# Patient Record
Sex: Female | Born: 1970
Health system: Southern US, Community
[De-identification: ages and names within clinical notes are randomized; demographics above are authoritative.]

## PROBLEM LIST (undated history)

## (undated) DIAGNOSIS — K5730 Diverticulosis of large intestine without perforation or abscess without bleeding: Secondary | ICD-10-CM

## (undated) DIAGNOSIS — J189 Pneumonia, unspecified organism: Secondary | ICD-10-CM

## (undated) DIAGNOSIS — E785 Hyperlipidemia, unspecified: Secondary | ICD-10-CM

## (undated) DIAGNOSIS — M419 Scoliosis, unspecified: Secondary | ICD-10-CM

## (undated) DIAGNOSIS — K5792 Diverticulitis of intestine, part unspecified, without perforation or abscess without bleeding: Secondary | ICD-10-CM

## (undated) DIAGNOSIS — N809 Endometriosis, unspecified: Secondary | ICD-10-CM

## (undated) DIAGNOSIS — M810 Age-related osteoporosis without current pathological fracture: Secondary | ICD-10-CM

## (undated) DIAGNOSIS — I700 Atherosclerosis of aorta: Secondary | ICD-10-CM

## (undated) DIAGNOSIS — Z720 Tobacco use: Secondary | ICD-10-CM

## (undated) HISTORY — DX: Diverticulosis of large intestine without perforation or abscess without bleeding: K57.30

## (undated) HISTORY — DX: Scoliosis, unspecified: M41.9

## (undated) HISTORY — DX: Atherosclerosis of aorta: I70.0

## (undated) HISTORY — DX: Tobacco use: Z72.0

## (undated) HISTORY — DX: Diverticulitis of intestine, part unspecified, without perforation or abscess without bleeding: K57.92

## (undated) HISTORY — DX: Hyperlipidemia, unspecified: E78.5

---
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
## 2002-09-10 HISTORY — PX: ABDOMINAL HYSTERECTOMY: SHX81

## 2004-09-10 HISTORY — PX: INCISIONAL HERNIA REPAIR: SHX193

## 2006-09-10 HISTORY — PX: INGUINAL HERNIA REPAIR: SUR1180

## 2006-10-04 ENCOUNTER — Ambulatory Visit: Payer: Self-pay | Admitting: General Surgery

## 2006-10-08 ENCOUNTER — Ambulatory Visit: Payer: Self-pay | Admitting: General Surgery

## 2011-12-28 ENCOUNTER — Ambulatory Visit: Payer: Self-pay | Admitting: Family Medicine

## 2012-06-06 ENCOUNTER — Emergency Department: Payer: Self-pay | Admitting: Emergency Medicine

## 2012-06-06 LAB — URINALYSIS, COMPLETE
Bilirubin,UR: NEGATIVE
Glucose,UR: NEGATIVE mg/dL (ref 0–75)
Specific Gravity: 1.027 (ref 1.003–1.030)
WBC UR: 3 /HPF (ref 0–5)

## 2012-06-06 LAB — CBC WITH DIFFERENTIAL/PLATELET
Eosinophil #: 0 10*3/uL (ref 0.0–0.7)
HCT: 42.7 % (ref 35.0–47.0)
HGB: 14.5 g/dL (ref 12.0–16.0)
Lymphocyte %: 5.5 %
MCH: 31.6 pg (ref 26.0–34.0)
MCHC: 33.9 g/dL (ref 32.0–36.0)
MCV: 93 fL (ref 80–100)
Monocyte #: 0.5 x10 3/mm (ref 0.2–0.9)
Neutrophil %: 89.2 %
Platelet: 178 10*3/uL (ref 150–440)

## 2012-06-06 LAB — COMPREHENSIVE METABOLIC PANEL
Alkaline Phosphatase: 90 U/L (ref 50–136)
Anion Gap: 12 (ref 7–16)
Bilirubin,Total: 0.5 mg/dL (ref 0.2–1.0)
Calcium, Total: 9.1 mg/dL (ref 8.5–10.1)
Chloride: 100 mmol/L (ref 98–107)
Co2: 23 mmol/L (ref 21–32)
Creatinine: 0.63 mg/dL (ref 0.60–1.30)
EGFR (African American): >60
EGFR (Non-African Amer.): >60
Osmolality: 269 (ref 275–301)
Potassium: 3.7 mmol/L (ref 3.5–5.1)
Sodium: 135 mmol/L — ABNORMAL LOW (ref 136–145)

## 2012-06-06 LAB — RAPID INFLUENZA A&B ANTIGENS

## 2012-06-06 IMAGING — CR DG CHEST 2V
1 series · 3 of 3 positions shown · non-contrast
Comparison: none

REASON FOR EXAM: cough
COMMENTS:

PROCEDURE:     DXR - DXR CHEST PA (OR AP) AND LATERAL  - [DATE]  [DATE]
RESULT:     Comparison: None

[Series 1: w chest pa · 0.14mm/px · 3 of 3 slices shown]
[im 1/3]
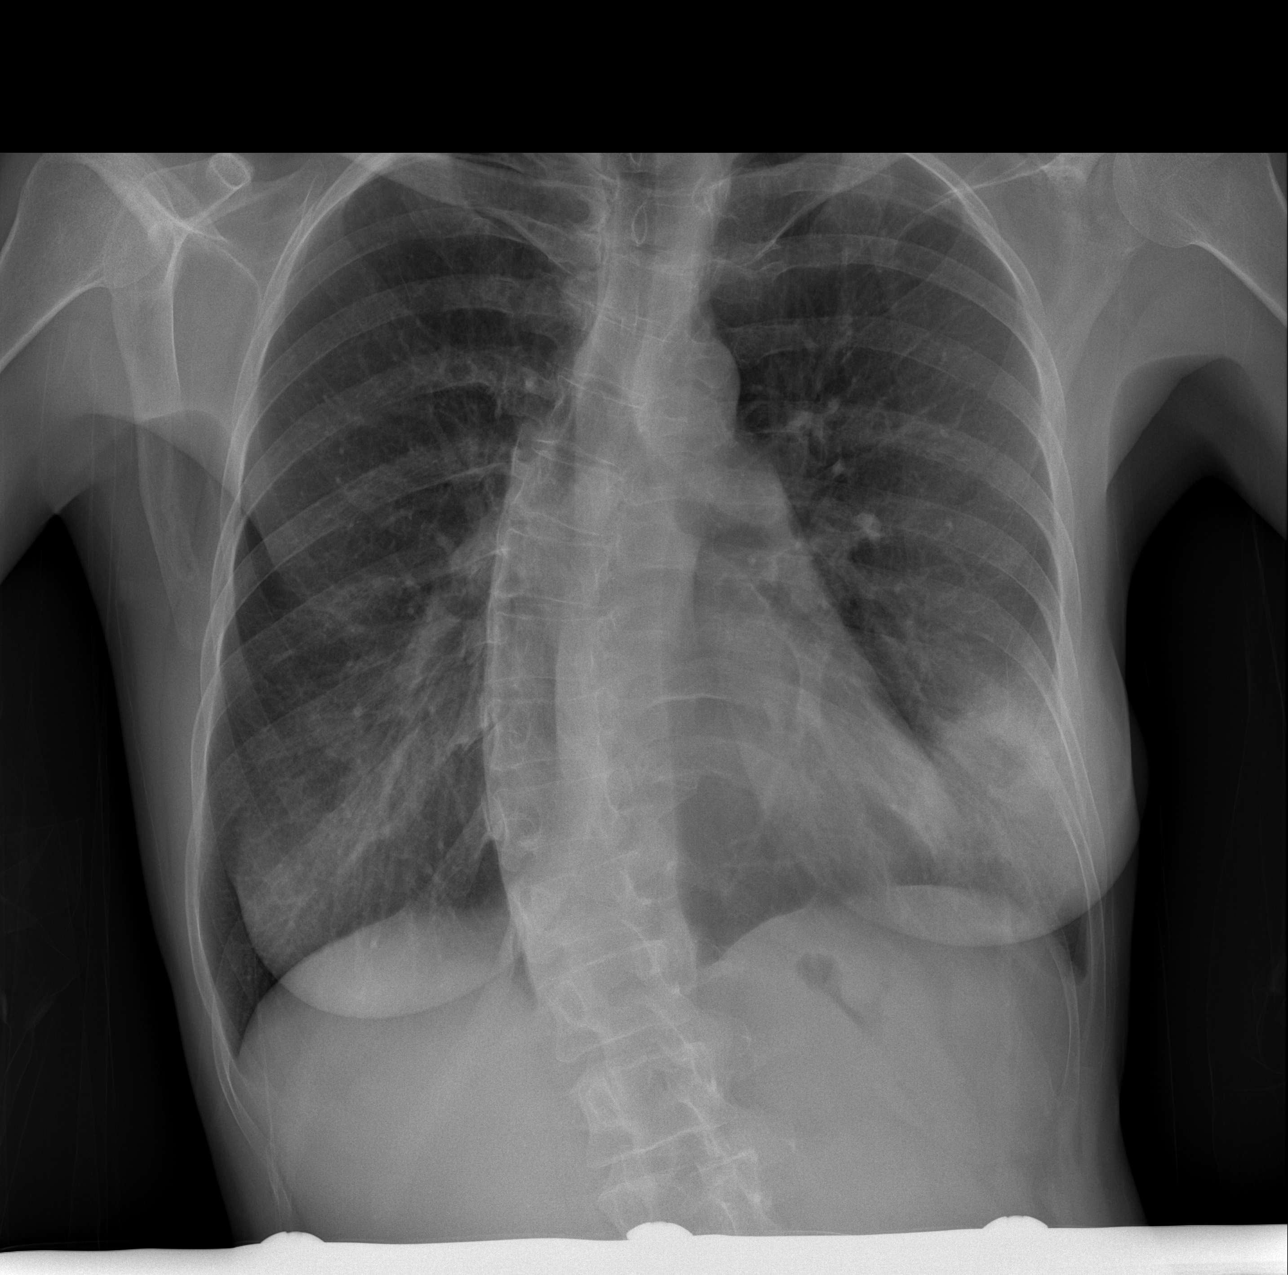
[im 2/3]
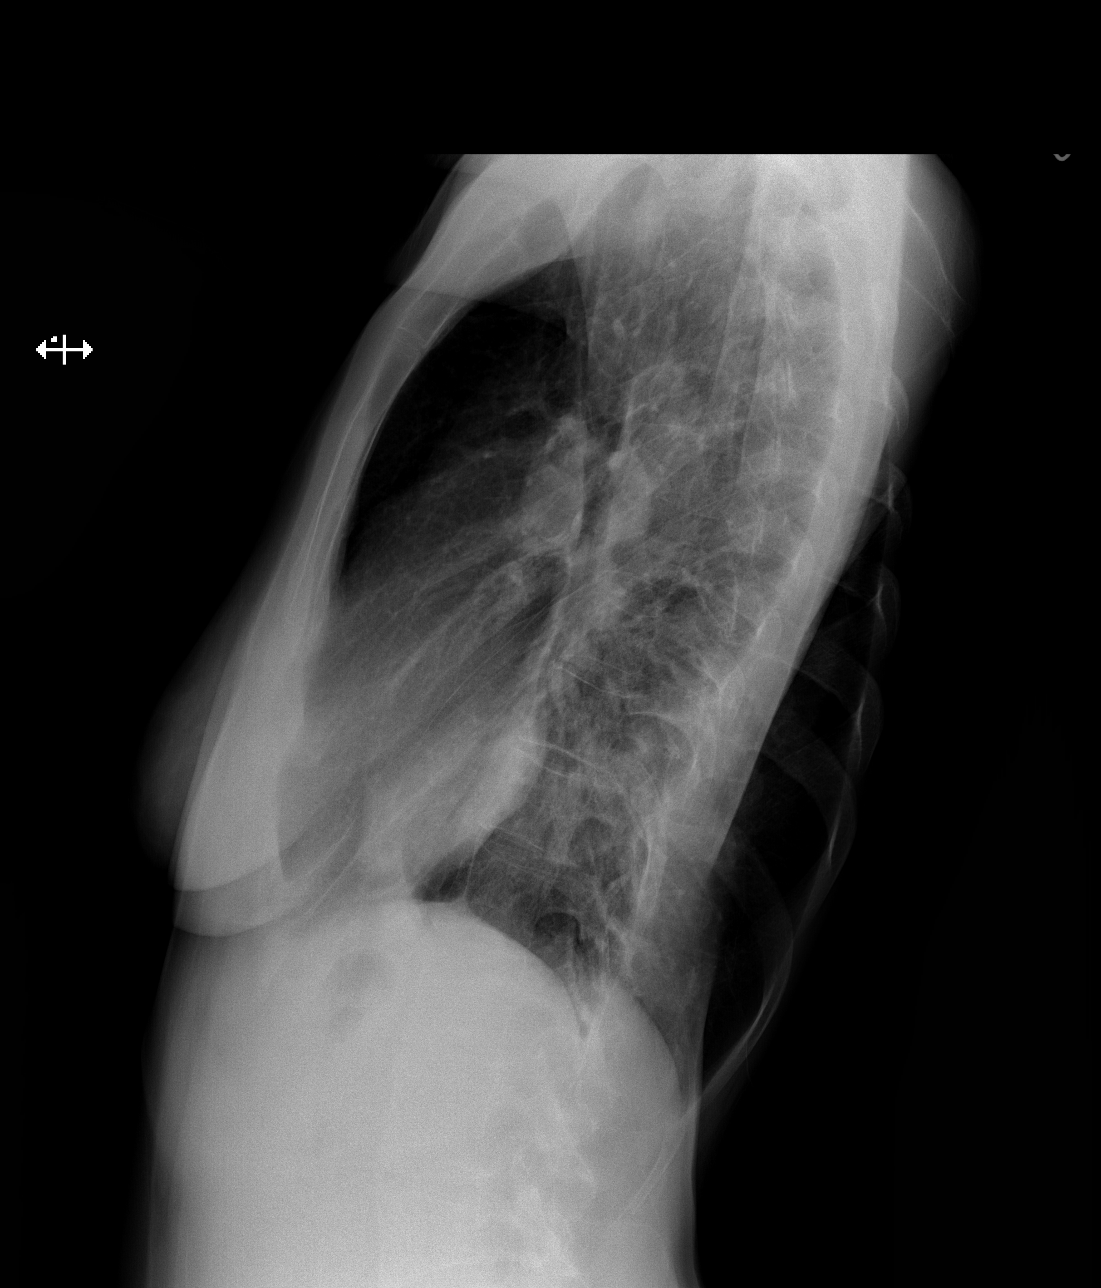
[im 3/3]
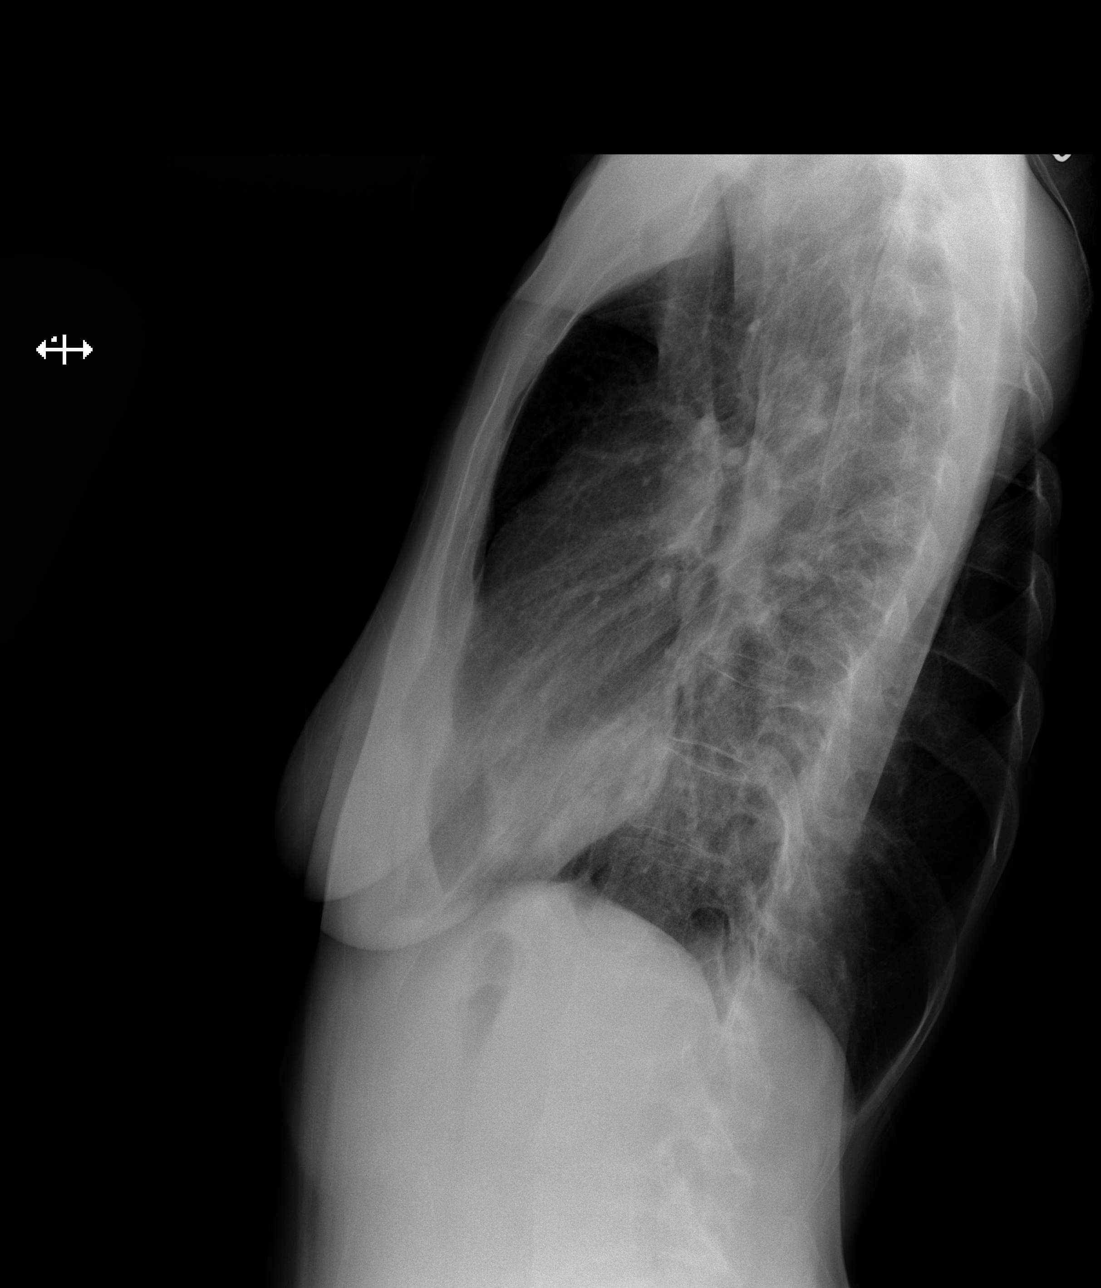

[3 of 3 positions shown; findings below may reference images not displayed]

FINDINGS: PA and lateral chest radiographs are provided. There is lingular airspace
disease concerning for pneumonia. There is no pleural effusion or
pneumothorax. The heart and mediastinum are unremarkable.  The osseous
structures are unremarkable.
IMPRESSION: Lingular pneumonia. Recommend followup radiography to document complete
resolution following adequate medical therapy. If there is not complete
resolution, then recommend further evaluation with CT of the chest to
exclude underlying pathology.

[REDACTED]

## 2012-06-06 IMAGING — CT CT ABD-PELV W/O CM
1 of 2 series · 15 of 32 positions shown, 19 images · non-contrast
Comparison: none

REASON FOR EXAM: (1) left flank pain; (2) left flank pain
COMMENTS:

PROCEDURE:     CT  - CT ABDOMEN AND PELVIS W[DATE]  [DATE]
RESULT:     CT abdomen pelvis dated [DATE].
TECHNIQUE: Helical noncontrasted 3 mm sections were obtained from the lung
bases through the pubic symphysis.

[Series 2: 3mm soft tissue · axial · 0.67mm/px · z∈[-478,-112]mm · 15 of 134 slices shown, 19 images]
[im 6/134  soft-tissue]
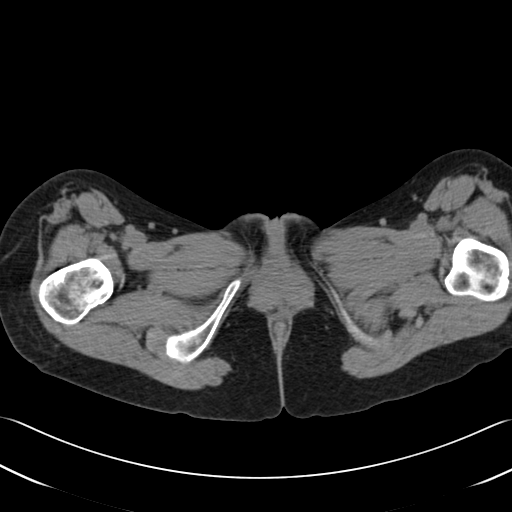
[im 6/134  bone]
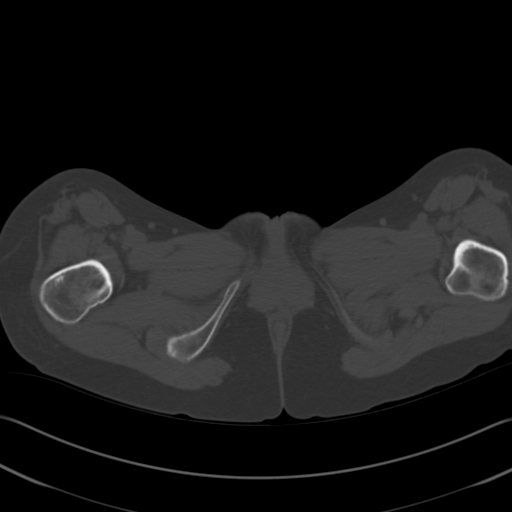
[im 17/134  soft-tissue]
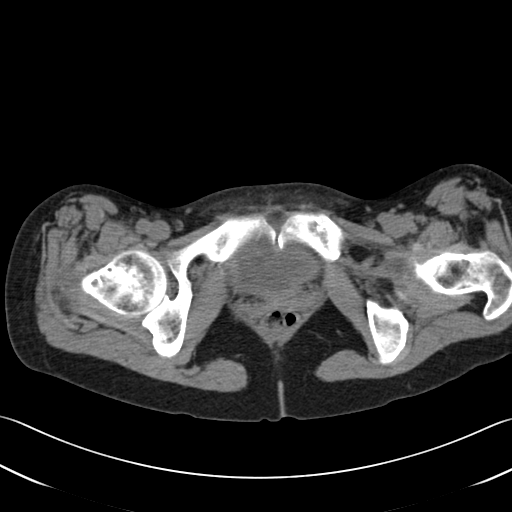
[im 28/134  soft-tissue]
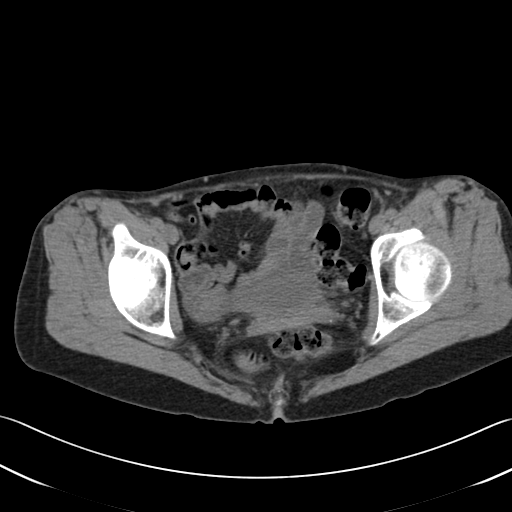
[im 39/134  soft-tissue]
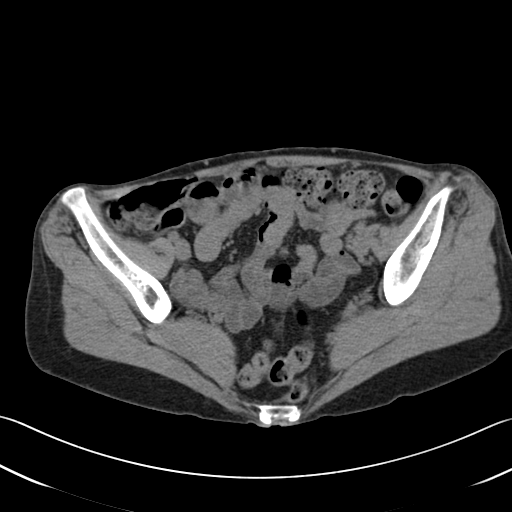
[im 45/134  soft-tissue]
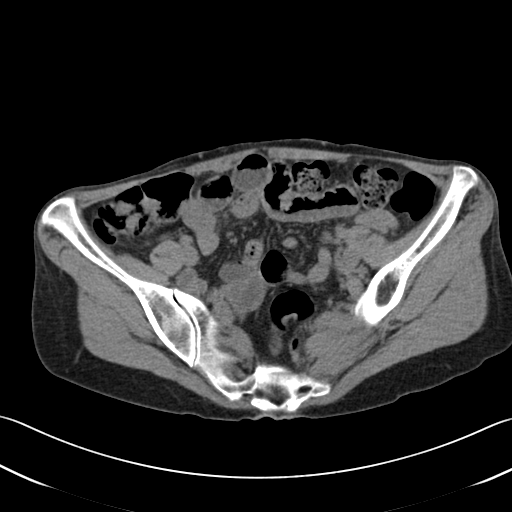
[im 56/134  soft-tissue]
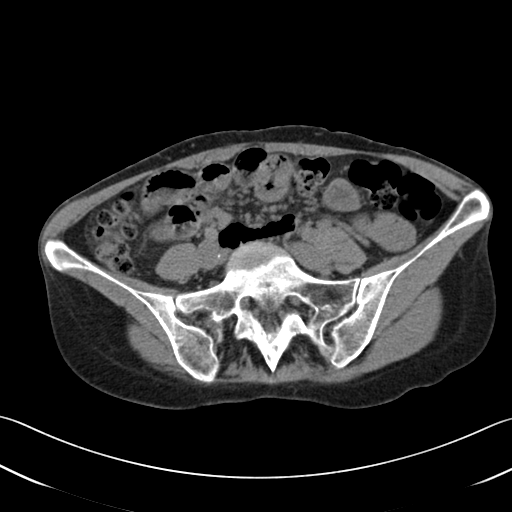
[im 67/134  soft-tissue]
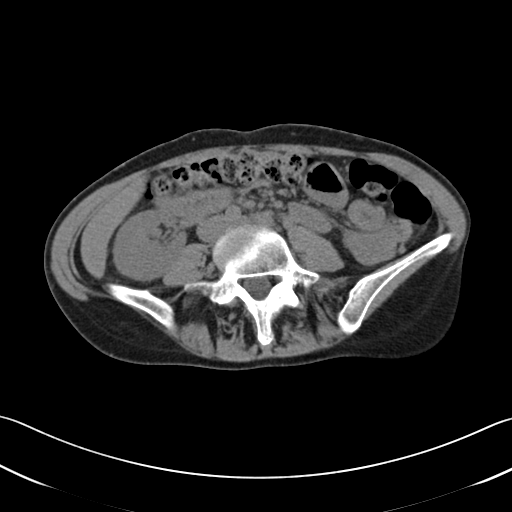
[im 78/134  soft-tissue]
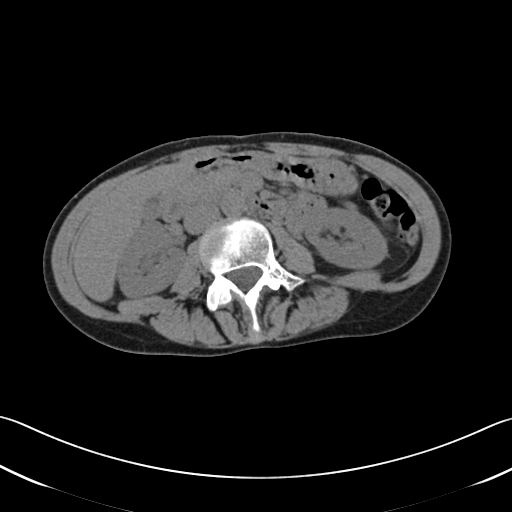
[im 89/134  soft-tissue]
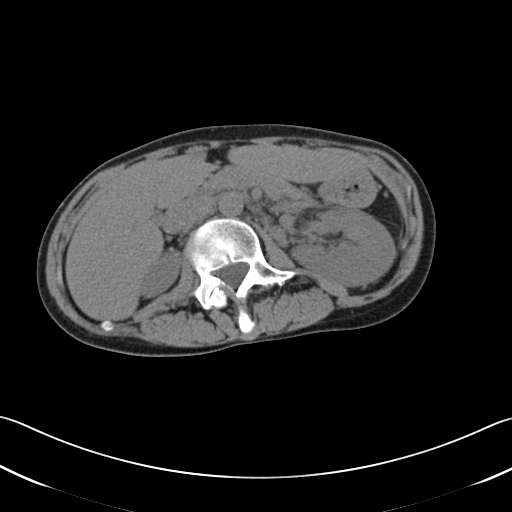
[im 89/134  bone]
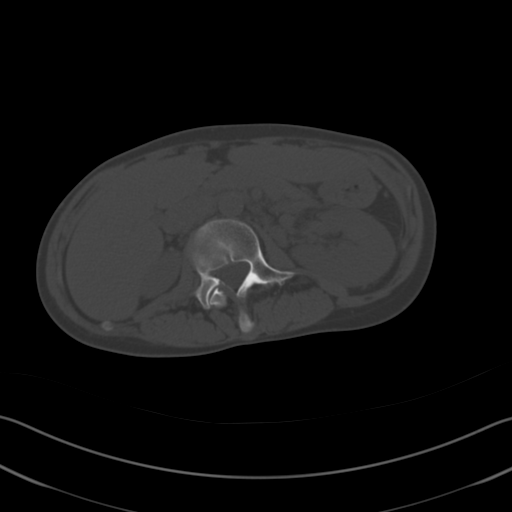
[im 95/134  soft-tissue]
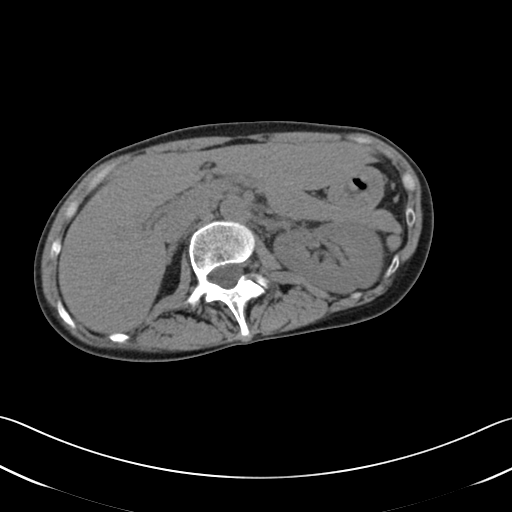
[im 106/134  soft-tissue]
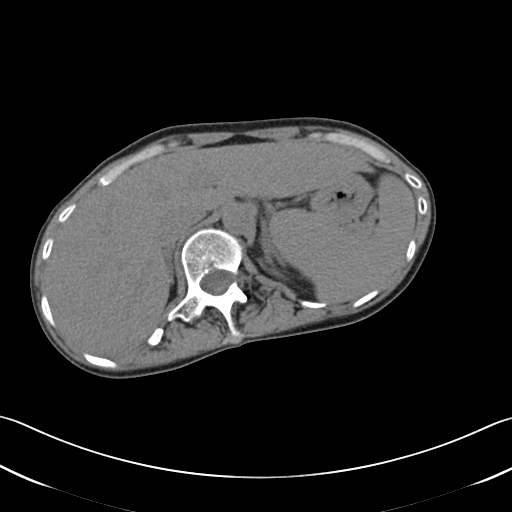
[im 111/134  lung]
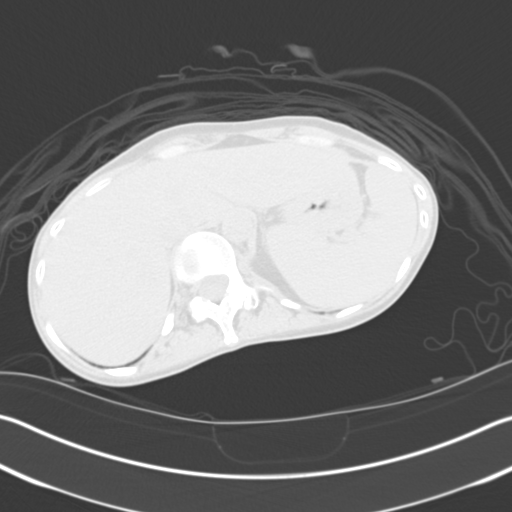
[im 117/134  soft-tissue]
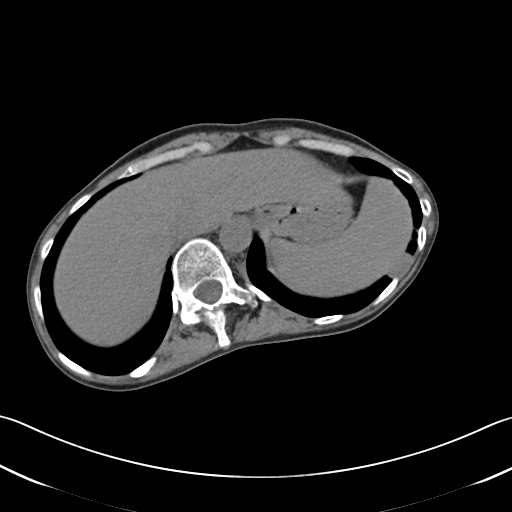
[im 117/134  lung]
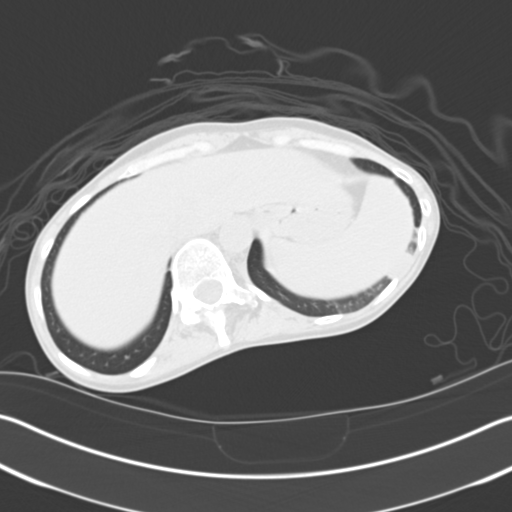
[im 122/134  lung]
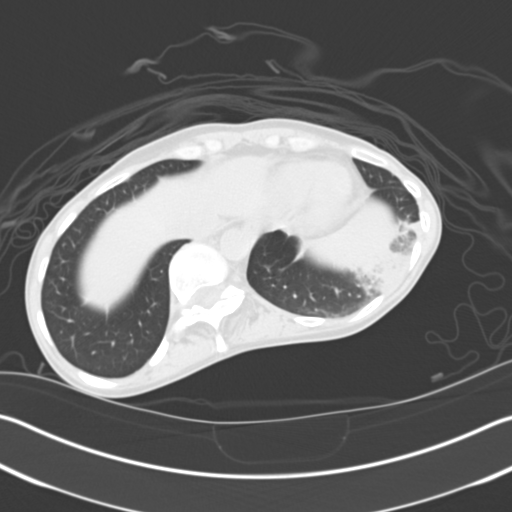
[im 128/134  soft-tissue]
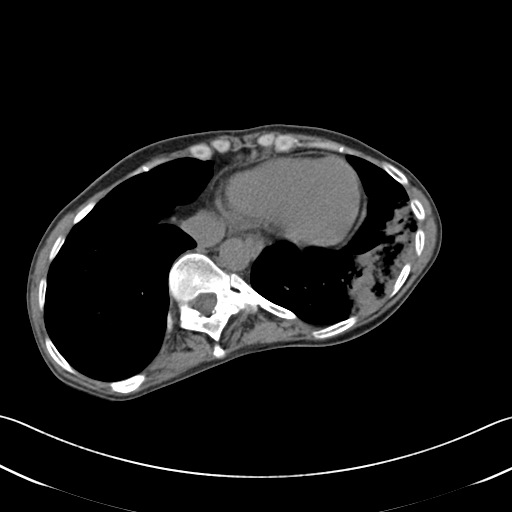
[im 128/134  lung]
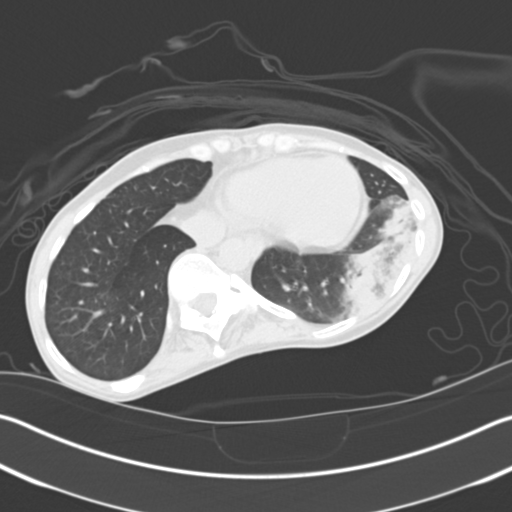

[15 of 32 positions shown; findings below may reference images not displayed]

FINDINGS: An area of increased density with consolidative component projects
within the lateral aspect of the base of the lingula.

Noncontrast evaluation of the liver, spleen, kidneys, pancreas is
unremarkable. There is no evidence of hydronephrosis, hydroureter,
nephrolithiasis nor ureterolithiasis. There is no CT evidence of bowel
obstruction, enteritis, colitis, diverticulitis, nor appendicitis within the
limitations of a noncontrast CT. There is no evidence of abdominal aortic
aneurysm. There is no evidence of abdominal pelvic free fluid, loculated
fluid collections nor gross evidence of masses or adenopathy.
IMPRESSION: Findings consistent with a lingula infiltrate. Note
surveillance evaluation with plain film chest radiograph status post
appropriate therapeutic regiment is recommended to ensure resolution. If
this area persists status post appropriate therapeutic regiment evaluation
with chest CT is recommended and possibly pulmonary consultation.

## 2012-06-07 LAB — URINE CULTURE

## 2012-06-13 ENCOUNTER — Ambulatory Visit: Payer: Self-pay | Admitting: Family Medicine

## 2012-06-13 IMAGING — CR DG CHEST 2V
1 series · 2 of 2 positions shown · non-contrast
Comparison: none

REASON FOR EXAM: pneumonia
COMMENTS:

[Series 1: pa · 0.17mm/px · 2 of 2 slices shown]
[im 1/2]
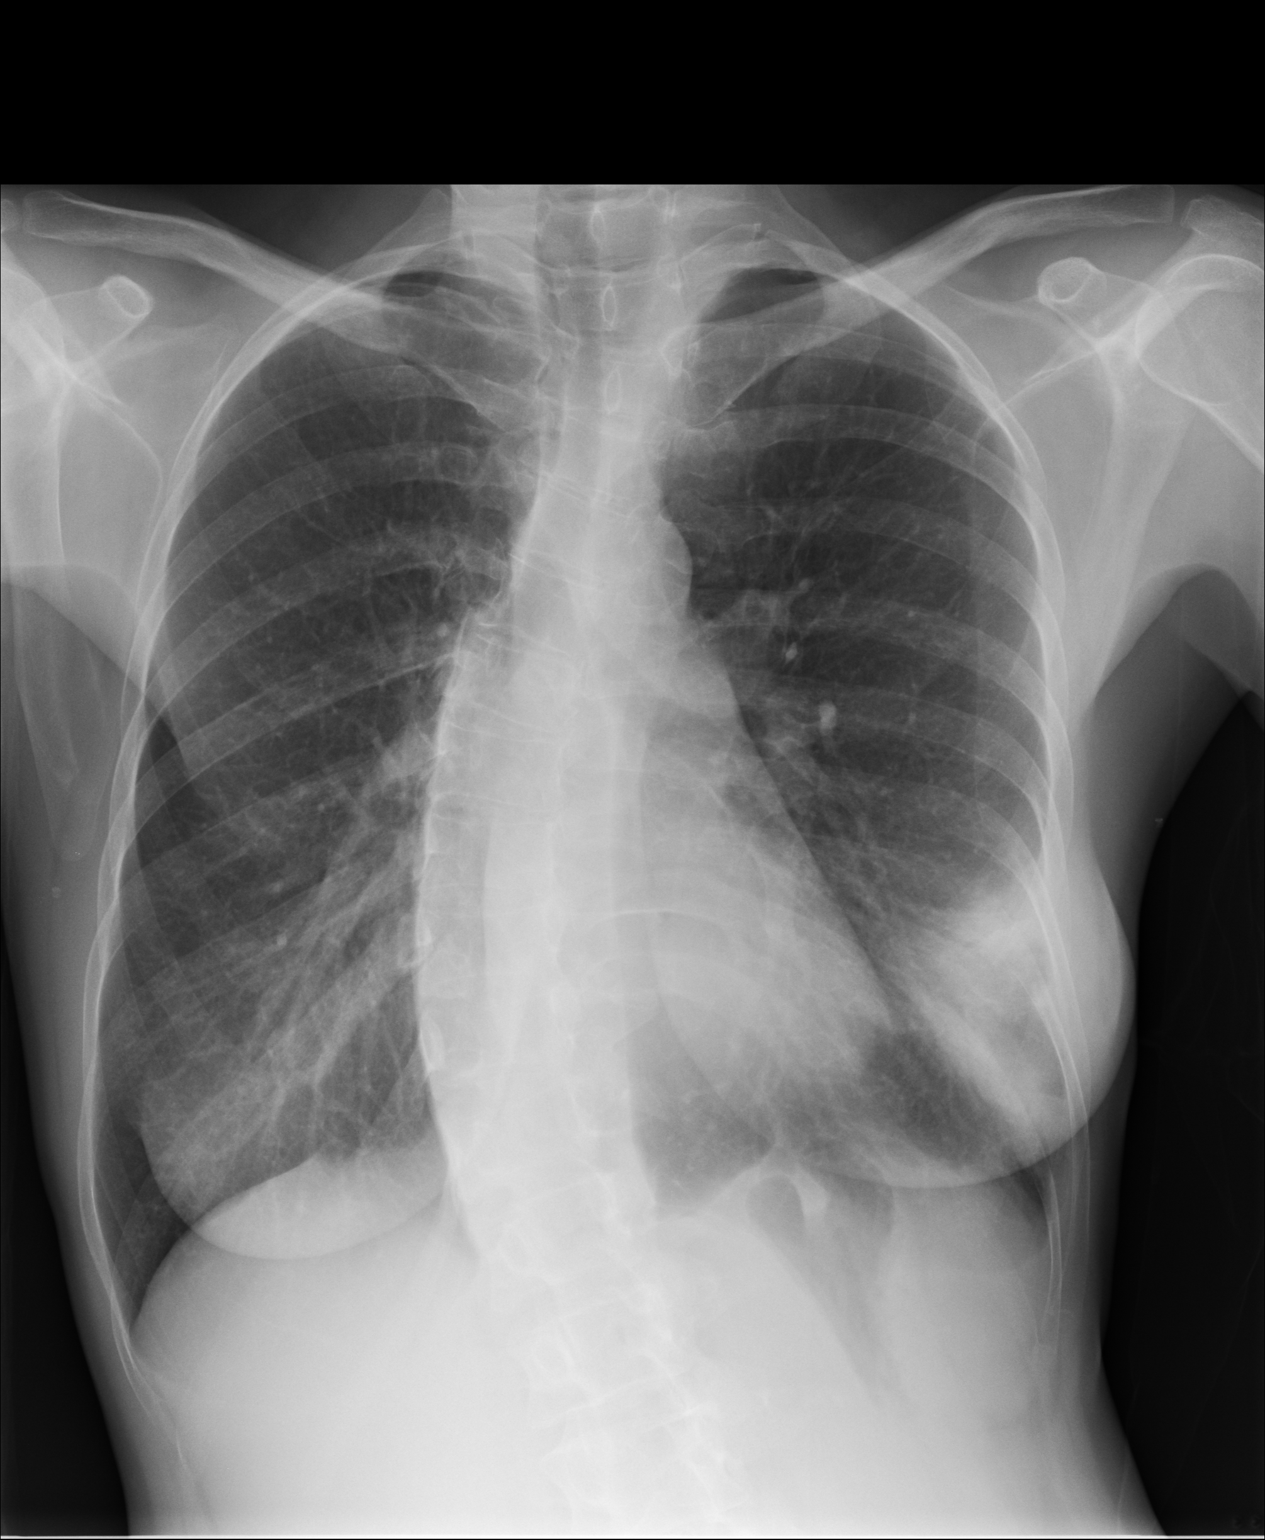
[im 2/2]
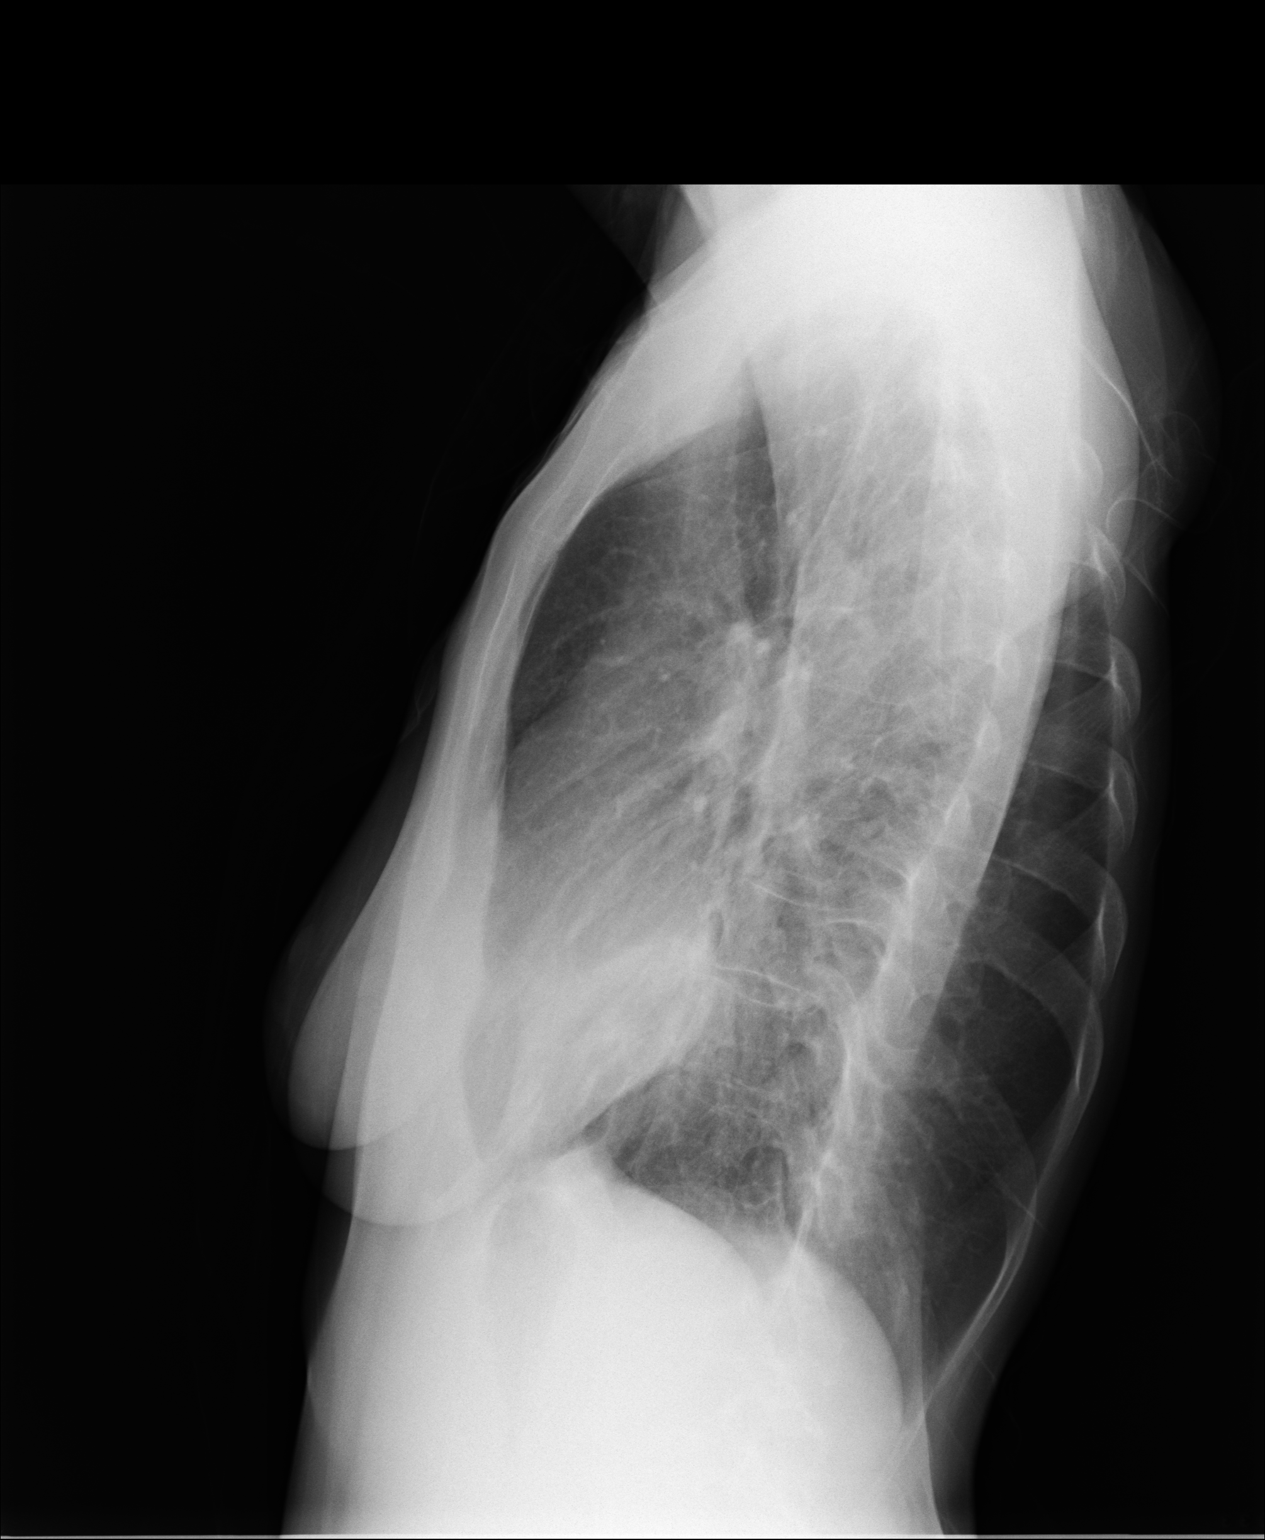

[2 of 2 positions shown; findings below may reference images not displayed]

PROCEDURE:     SANHER - SANHER CHEST PA (OR AP) AND LAT  - [DATE] [DATE]

RESULT:     Comparison is made to the study [DATE].

There remains density in the lateral aspect of the lingula. It has become
more dense. Elsewhere the lungs remain hyperinflated. No other infiltrate is
seen and there is no pleural effusion. The cardiac silhouette is normal in
size.
IMPRESSION: There is persistent increased density peripherally in the
left lower lung likely in the lingula laterally. Further evaluation with CT
scanning is recommended given the increase rather than the expected decrease
in density.

[REDACTED]

## 2012-06-26 ENCOUNTER — Ambulatory Visit: Payer: Self-pay | Admitting: Internal Medicine

## 2012-06-26 IMAGING — CT CT CHEST W/ CM
1 series · 16 of 34 positions shown, 20 images · IV contrast (isovue)
Comparison: none

REASON FOR EXAM: abnormal XR recommended chest CT
COMMENTS:

PROCEDURE:     KCT - KCT CHEST WITH CONTRAST  - [DATE]  [DATE]
RESULT:     Comparison: Chest radiograph of [DATE] and [DATE]
TECHNIQUE: Multiple axial images of the chest were obtained with 75 mL
Isovue 370 intravenous contrast.

[Series 2: chest w/ 3.0 i31f 2 · axial · 0.68mm/px · z∈[-815,-536]mm · 16 of 105 slices shown, 20 images]
[im 8/105  mediastinal]
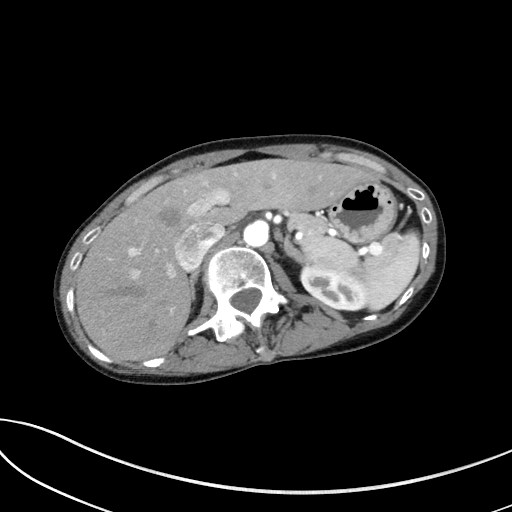
[im 8/105  lung]
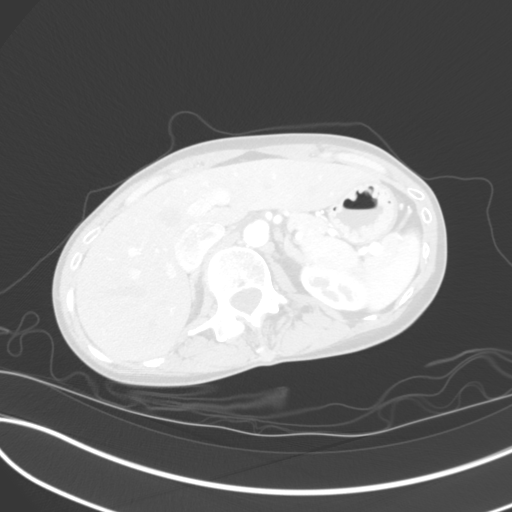
[im 16/105  lung]
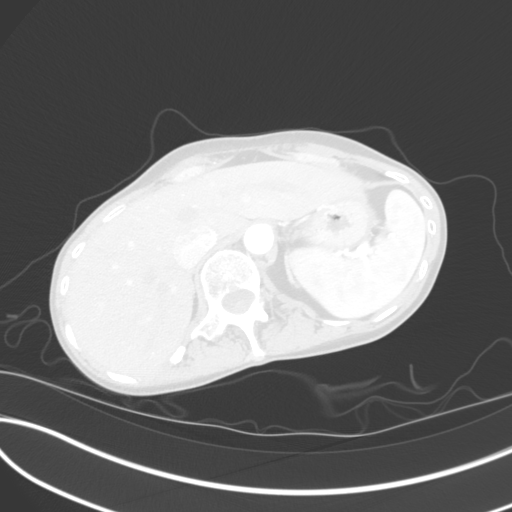
[im 21/105  lung]
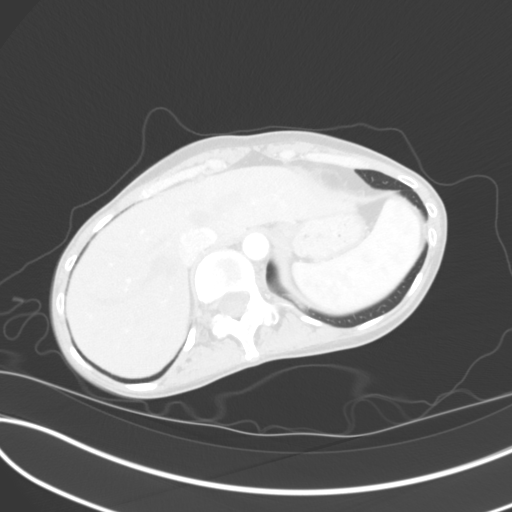
[im 27/105  lung]
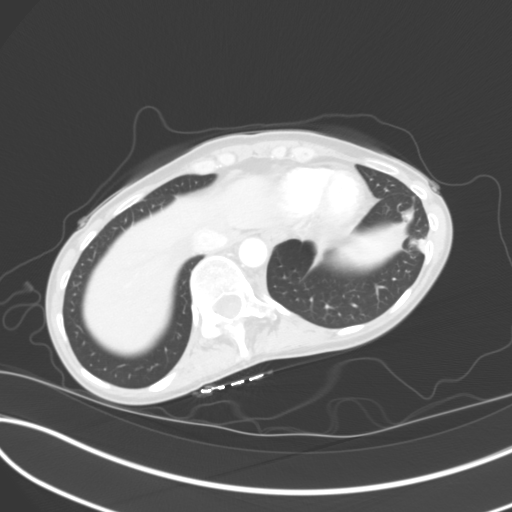
[im 35/105  mediastinal]
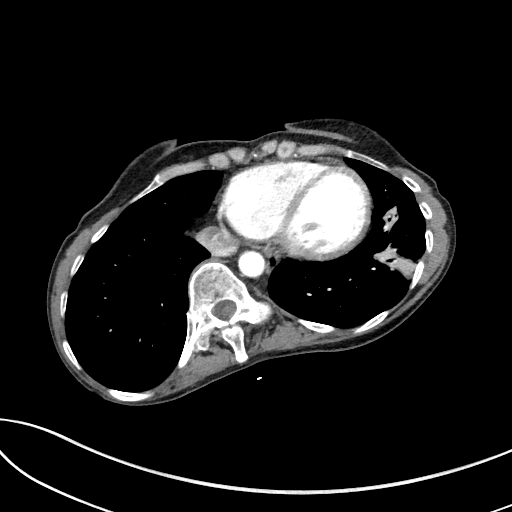
[im 35/105  lung]
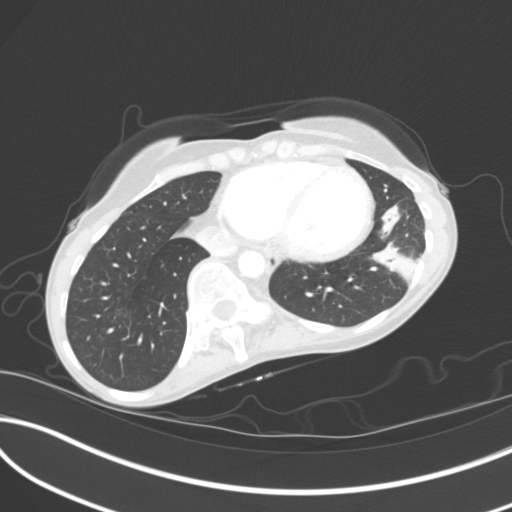
[im 42/105  lung]
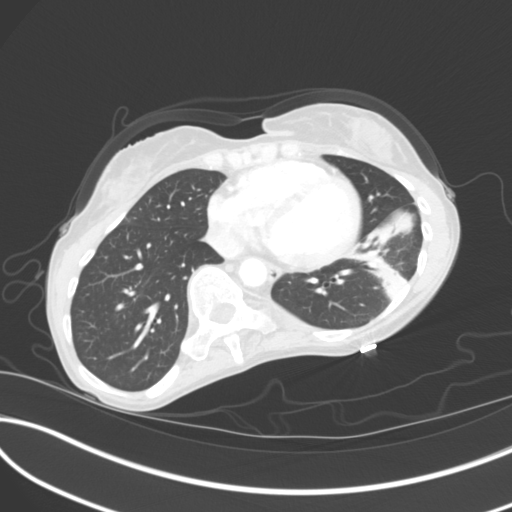
[im 47/105  lung]
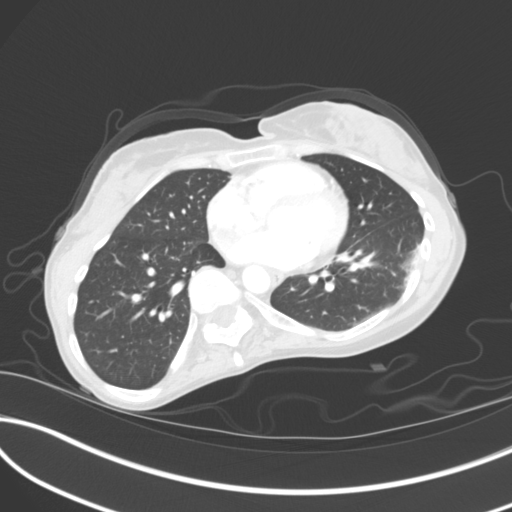
[im 51/105  lung]
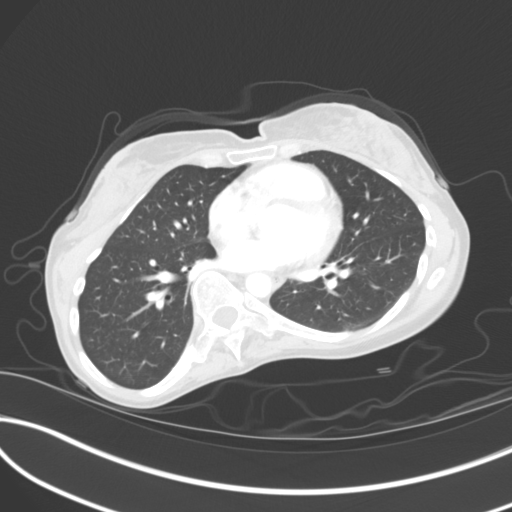
[im 56/105  mediastinal]
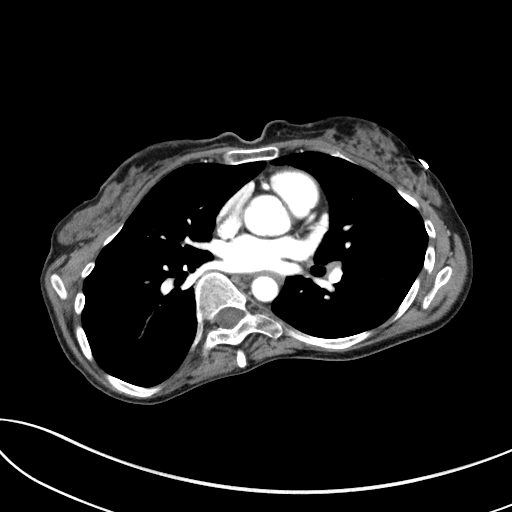
[im 56/105  lung]
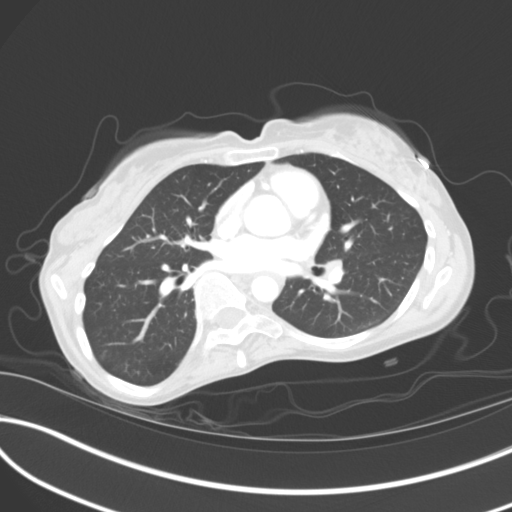
[im 62/105  lung]
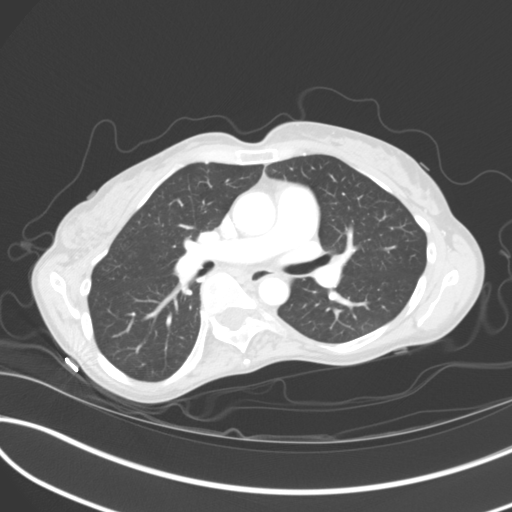
[im 66/105  lung]
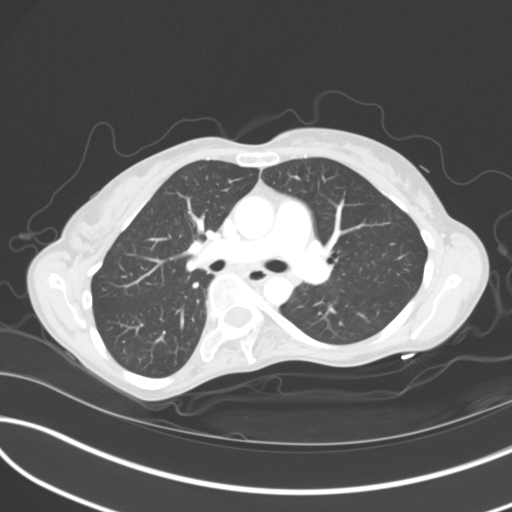
[im 74/105  lung]
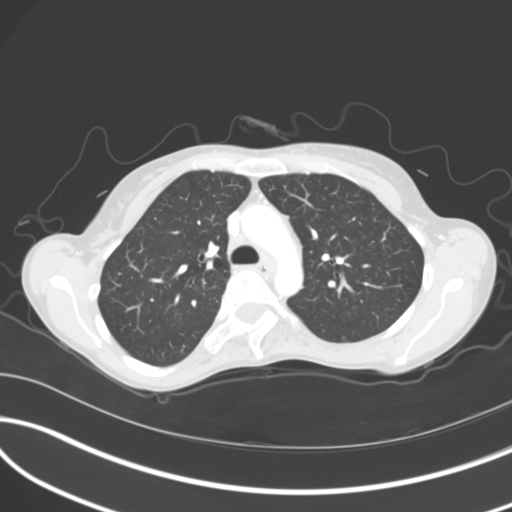
[im 81/105  mediastinal]
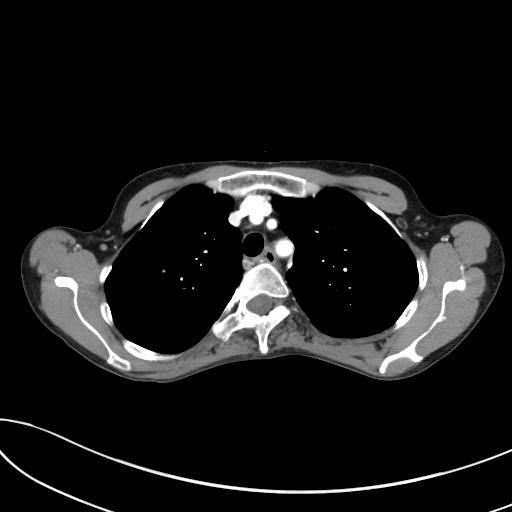
[im 81/105  lung]
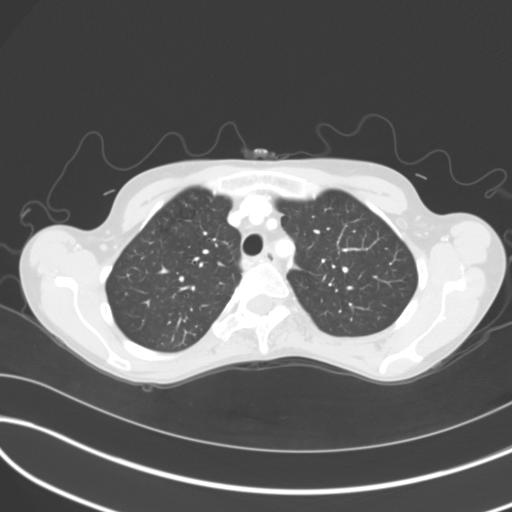
[im 85/105  lung]
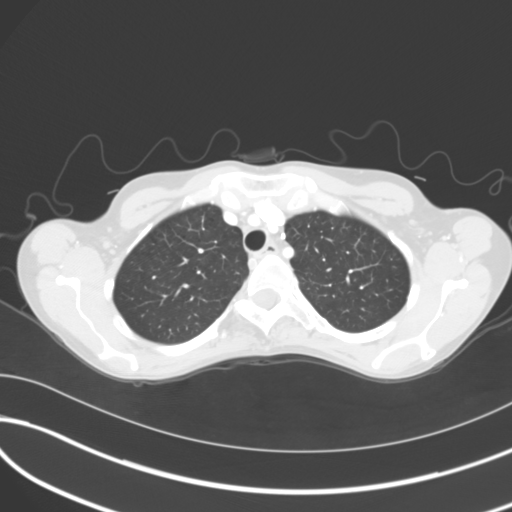
[im 93/105  lung]
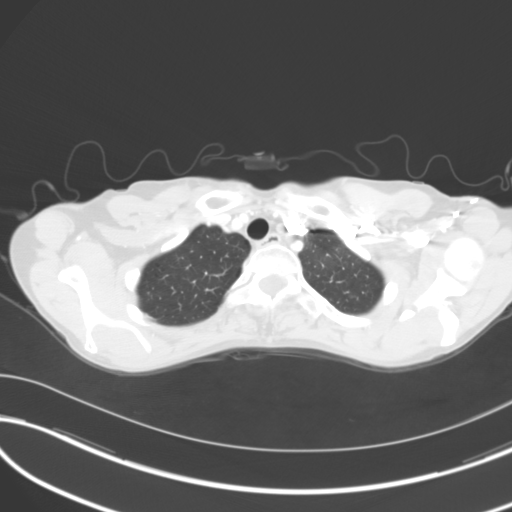
[im 101/105  lung]
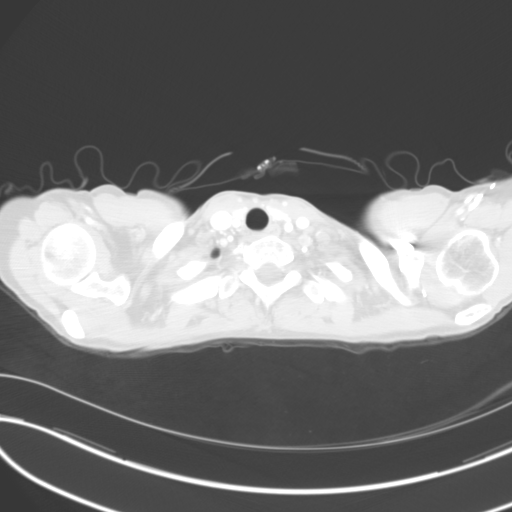

[16 of 34 positions shown; findings below may reference images not displayed]

FINDINGS: No mediastinal, hilar, or axillary lymphadenopathy.

There is mild scarring at the lung apices. There are 2 heterogeneous
bandlike densities in the left lower lobe which extend to the periphery of
the lung. These correlate with the findings on the chest radiograph. There
are some air bronchograms present.

No aggressive lytic or sclerotic osseous lesions are identified. There is a
dextroscoliosis of the thoracic spine.
IMPRESSION: There are two heterogeneous bandlike opacities in the left lower lobe. These
are nonspecific and may be infectious or inflammatory. Followup radiography
is recommended to ensure resolution and exclude other etiology.

## 2012-07-29 ENCOUNTER — Ambulatory Visit: Payer: Self-pay | Admitting: Family Medicine

## 2012-07-29 IMAGING — CR DG CHEST 2V
1 series · 2 of 2 positions shown · non-contrast
Comparison: none

REASON FOR EXAM: back pain congestion cough
COMMENTS:

PROCEDURE:     SAS - SAS CHEST PA (OR AP) AND LAT  - [DATE]  [DATE]
RESULT:     Comparison: Chest radiograph [DATE] and CT of the chest
[DATE]

[Series 1: pa · 0.17mm/px · 2 of 2 slices shown]
[im 1/2]
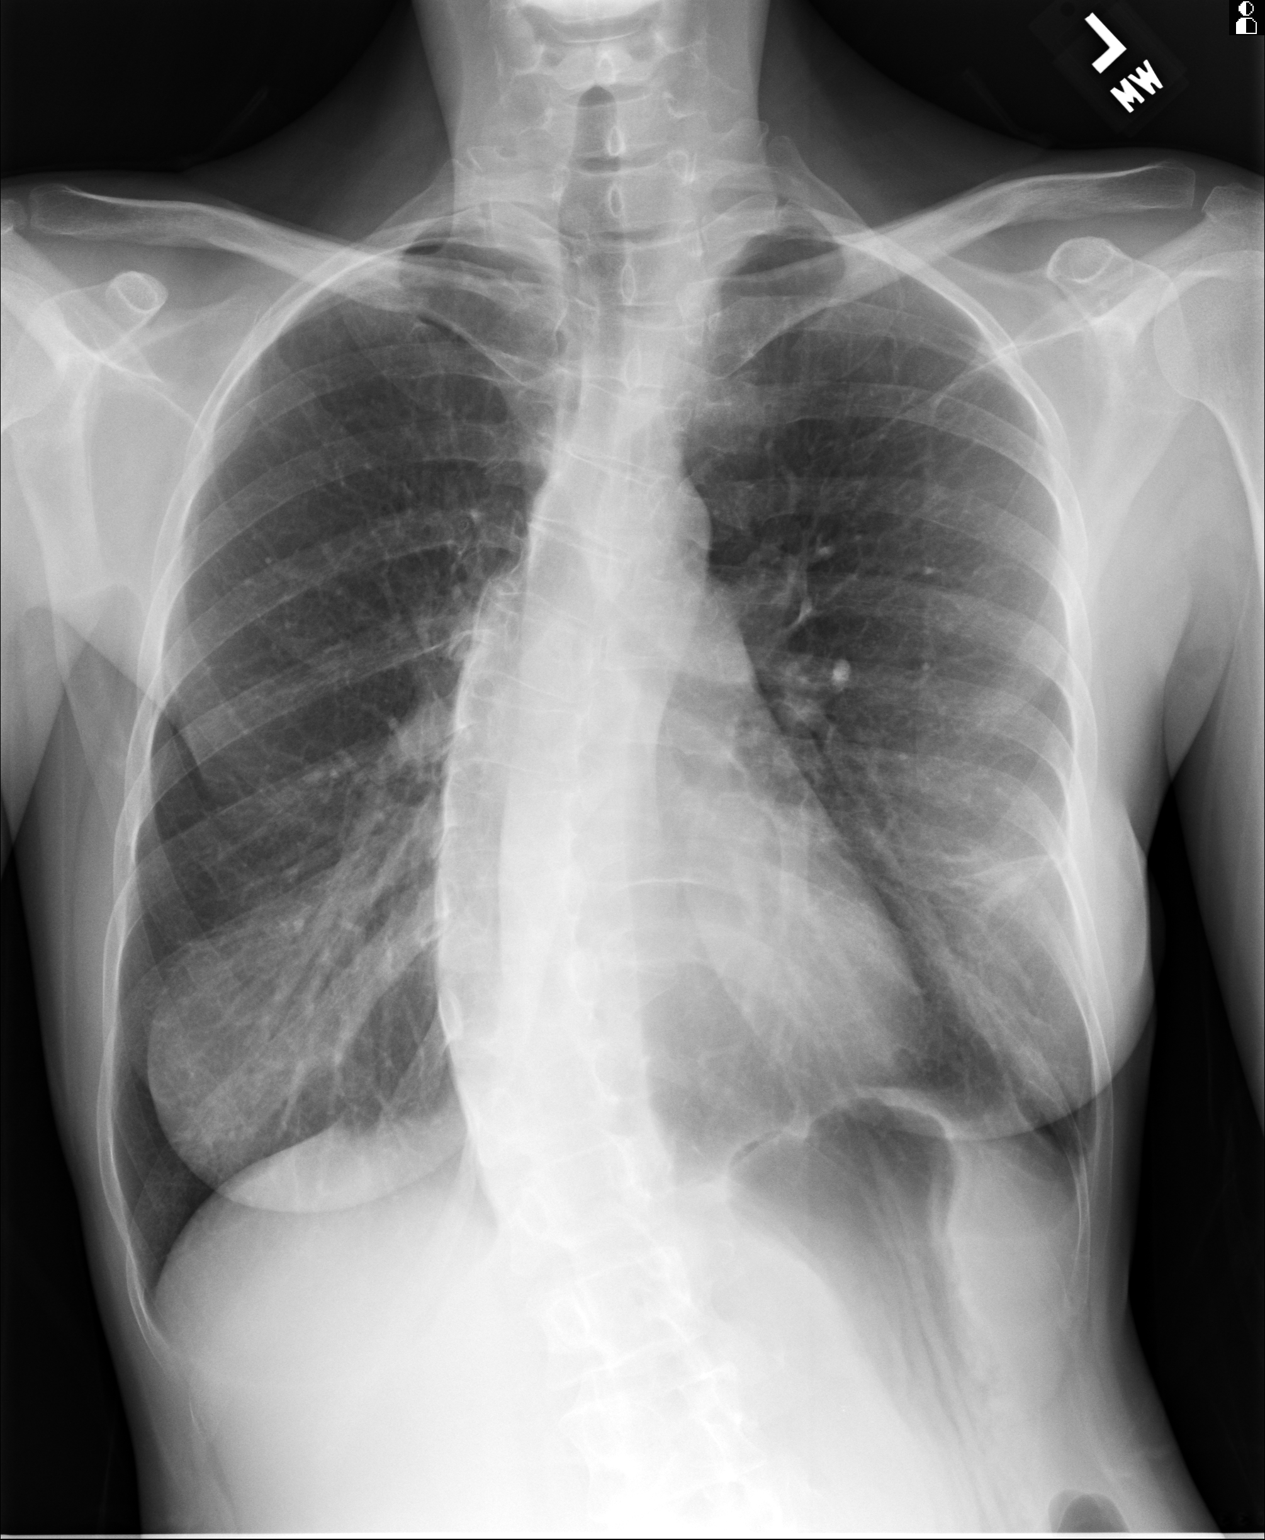
[im 2/2]
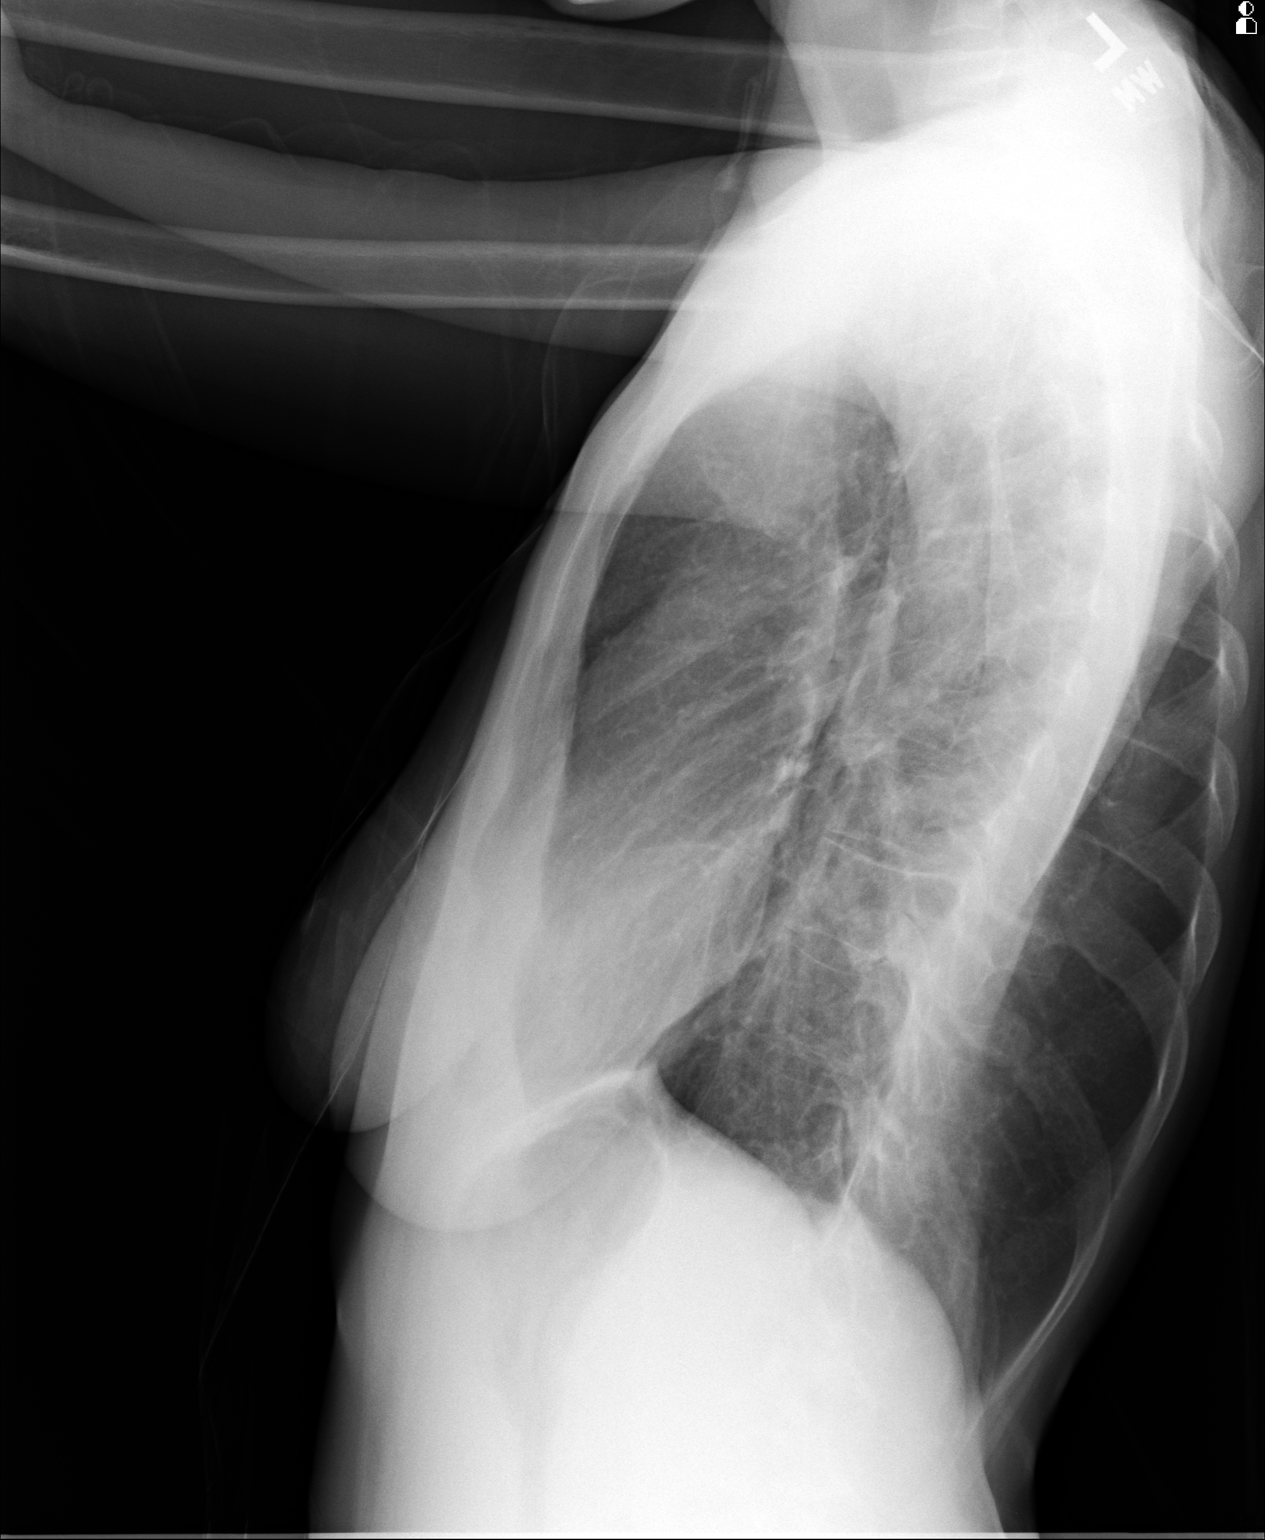

[2 of 2 positions shown; findings below may reference images not displayed]

FINDINGS: The heart and mediastinum are stable. There is dextroscoliosis of the
thoracic spine. There has been near complete resolution of the heterogeneous
opacity in the periphery of the left lower lung. The right lung is clear.
IMPRESSION: Near complete resolution of heterogeneous opacity in the periphery of the
left lower lung. Continued radiographic followup is recommended to ensure
complete resolution.

[REDACTED]

## 2012-09-04 ENCOUNTER — Inpatient Hospital Stay (HOSPITAL_COMMUNITY)
Admission: AD | Admit: 2012-09-04 | Discharge: 2012-09-04 | Disposition: A | Discharge status: Home / Self Care | Payer: BC Managed Care – PPO | Source: Ambulatory Visit | Attending: Obstetrics & Gynecology | Admitting: Obstetrics & Gynecology

## 2012-09-04 ENCOUNTER — Encounter (HOSPITAL_COMMUNITY): Payer: Self-pay | Admitting: *Deleted

## 2012-09-04 DIAGNOSIS — Z90710 Acquired absence of both cervix and uterus: Secondary | ICD-10-CM | POA: Insufficient documentation

## 2012-09-04 DIAGNOSIS — R109 Unspecified abdominal pain: Secondary | ICD-10-CM

## 2012-09-04 DIAGNOSIS — N952 Postmenopausal atrophic vaginitis: Secondary | ICD-10-CM

## 2012-09-04 DIAGNOSIS — N898 Other specified noninflammatory disorders of vagina: Secondary | ICD-10-CM

## 2012-09-04 HISTORY — DX: Pneumonia, unspecified organism: J18.9

## 2012-09-04 HISTORY — DX: Endometriosis, unspecified: N80.9

## 2012-09-04 LAB — URINALYSIS, ROUTINE W REFLEX MICROSCOPIC
Bilirubin Urine: NEGATIVE
Ketones, ur: NEGATIVE mg/dL
Nitrite: NEGATIVE
Specific Gravity, Urine: <1.005 — ABNORMAL LOW (ref 1.005–1.030)
Urobilinogen, UA: 0.2 mg/dL (ref 0.0–1.0)

## 2012-09-04 LAB — CBC
HCT: 43.4 % (ref 36.0–46.0)
Hemoglobin: 14.4 g/dL (ref 12.0–15.0)
MCHC: 33.2 g/dL (ref 30.0–36.0)
MCV: 92.3 fL (ref 78.0–100.0)

## 2012-09-04 LAB — WET PREP, GENITAL
Clue Cells Wet Prep HPF POC: NONE SEEN
Trich, Wet Prep: NONE SEEN

## 2012-09-04 LAB — URINE MICROSCOPIC-ADD ON

## 2012-09-04 MED ORDER — KETOROLAC TROMETHAMINE 60 MG/2ML IM SOLN
60.0000 mg | Freq: Once | INTRAMUSCULAR | Status: AC
  Administered 2012-09-04: 60 mg via INTRAMUSCULAR
  Filled 2012-09-04: qty 2
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
## 2012-09-04 NOTE — MAU Provider Note (Signed)
History     CSN: [id number]  Arrival date and time: 09/04/12 1140   First Provider Initiated Contact with Patient 09/04/12 1238      Chief Complaint  Patient presents with  . Vaginal Bleeding  . Abdominal Cramping   HPI  Kathryn Fuller is a 41 y.o. who presents today with vaginal bleeding and cramping. She states that 11 years ago she had a hysterectomy and they "took everything". This morning she had pink spotting and pain, and states that she continues to feel "gushing" of something coming from the vagina.   Past Medical History  Diagnosis Date  . Pneumonia   . Endometriosis     Past Surgical History  Procedure Date  . Abdominal hysterectomy   . Inguinal hernia repair     History reviewed. No pertinent family history.  History  Substance Use Topics  . Smoking status: Current Every Day Smoker -- 1.5 packs/day    Types: Cigarettes  . Smokeless tobacco: Not on file  . Alcohol Use: No    Allergies: No Known Allergies  Prescriptions prior to admission  Medication Sig Dispense Refill  . ibuprofen (ADVIL,MOTRIN) 200 MG tablet Take 800 mg by mouth every 8 (eight) hours as needed. For pain        Review of Systems  Constitutional: Negative for fever and chills.  Gastrointestinal: Positive for abdominal pain (cramps). Negative for nausea, vomiting, diarrhea and constipation.  Genitourinary: Negative for dysuria, urgency and frequency.  Neurological: Negative for dizziness.   Physical Exam   Blood pressure 96/71, pulse 91, temperature 97.5 F (36.4 C), temperature source Oral, height 5\' 6"  (1.676 m), weight 47.9 kg (105 lb 9.6 oz), SpO2 98.00%.  Physical Exam  Nursing note and vitals reviewed. Constitutional: She is oriented to person, place, and time. She appears well-developed and well-nourished. No distress.  Cardiovascular: Normal rate.   Respiratory: Effort normal.  GI: Soft.  Genitourinary:        External: normal Vagina: severly atrophic with  small amount of watery discharge. Tissue friable and bleeds easily from q-tip Cervix: SA Uterus: SA Adnexa: unable to palpate, presumed SA  Neurological: She is alert and oriented to person, place, and time.  Skin: Skin is warm and dry.  Psychiatric: She has a normal mood and affect.    MAU Course  Procedures  Results for orders placed during the hospital encounter of 09/04/12 (from the past 24 hour(s))  URINALYSIS, ROUTINE W REFLEX MICROSCOPIC     Status: Abnormal   Collection Time   09/04/12 11:55 AM      Component Value Range   Color, Urine YELLOW  YELLOW   APPearance CLEAR  CLEAR   Specific Gravity, Urine <1.005 (*) 1.005 - 1.030   pH 5.5  5.0 - 8.0   Glucose, UA NEGATIVE  NEGATIVE mg/dL   Hgb urine dipstick MODERATE (*) NEGATIVE   Bilirubin Urine NEGATIVE  NEGATIVE   Ketones, ur NEGATIVE  NEGATIVE mg/dL   Protein, ur NEGATIVE  NEGATIVE mg/dL   Urobilinogen, UA 0.2  0.0 - 1.0 mg/dL   Nitrite NEGATIVE  NEGATIVE   Leukocytes, UA TRACE (*) NEGATIVE  URINE MICROSCOPIC-ADD ON     Status: Normal   Collection Time   09/04/12 11:55 AM      Component Value Range   WBC, UA 0-2  <3 WBC/hpf   RBC / HPF 0-2  <3 RBC/hpf  CBC     Status: Normal   Collection Time   09/04/12 12:45 PM  Component Value Range   WBC 6.8  4.0 - 10.5 K/uL   RBC 4.70  3.87 - 5.11 MIL/uL   Hemoglobin 14.4  12.0 - 15.0 g/dL   HCT 40.9  81.1 - 91.4 %   MCV 92.3  78.0 - 100.0 fL   MCH 30.6  26.0 - 34.0 pg   MCHC 33.2  30.0 - 36.0 g/dL   RDW 78.2  95.6 - 21.3 %   Platelets 217  150 - 400 K/uL  WET PREP, GENITAL     Status: Abnormal   Collection Time   09/04/12 12:50 PM      Component Value Range   Yeast Wet Prep HPF POC NONE SEEN  NONE SEEN   Trich, Wet Prep NONE SEEN  NONE SEEN   Clue Cells Wet Prep HPF POC NONE SEEN  NONE SEEN   WBC, Wet Prep HPF POC MODERATE (*) NONE SEEN   Assessment and Plan   1. Atrophic vaginitis    FU with PCP as soon as possible to discuss estrogen  replacement   Tawnya Crook 09/04/2012, 12:41 PM

## 2012-09-04 NOTE — MAU Provider Note (Signed)
Attestation of Attending Supervision of Advanced Practitioner (CNM/NP): Evaluation and management procedures were performed by the Advanced Practitioner under my supervision and collaboration.  I have reviewed the Advanced Practitioner's note and chart, and I agree with the management and plan.  HARRAWAY-SMITH, Kaidan Harpster 4:09 PM     

## 2012-09-05 LAB — GC/CHLAMYDIA PROBE AMP
CT Probe RNA: NEGATIVE
GC Probe RNA: NEGATIVE

## 2012-09-19 ENCOUNTER — Ambulatory Visit: Payer: Self-pay | Admitting: Internal Medicine

## 2012-09-19 IMAGING — CR DG CHEST 2V
1 series · 2 of 2 positions shown · non-contrast
Comparison: none

REASON FOR EXAM: shortness of breath
COMMENTS:

[Series 1: pa · 0.17mm/px · 2 of 2 slices shown]
[im 1/2]
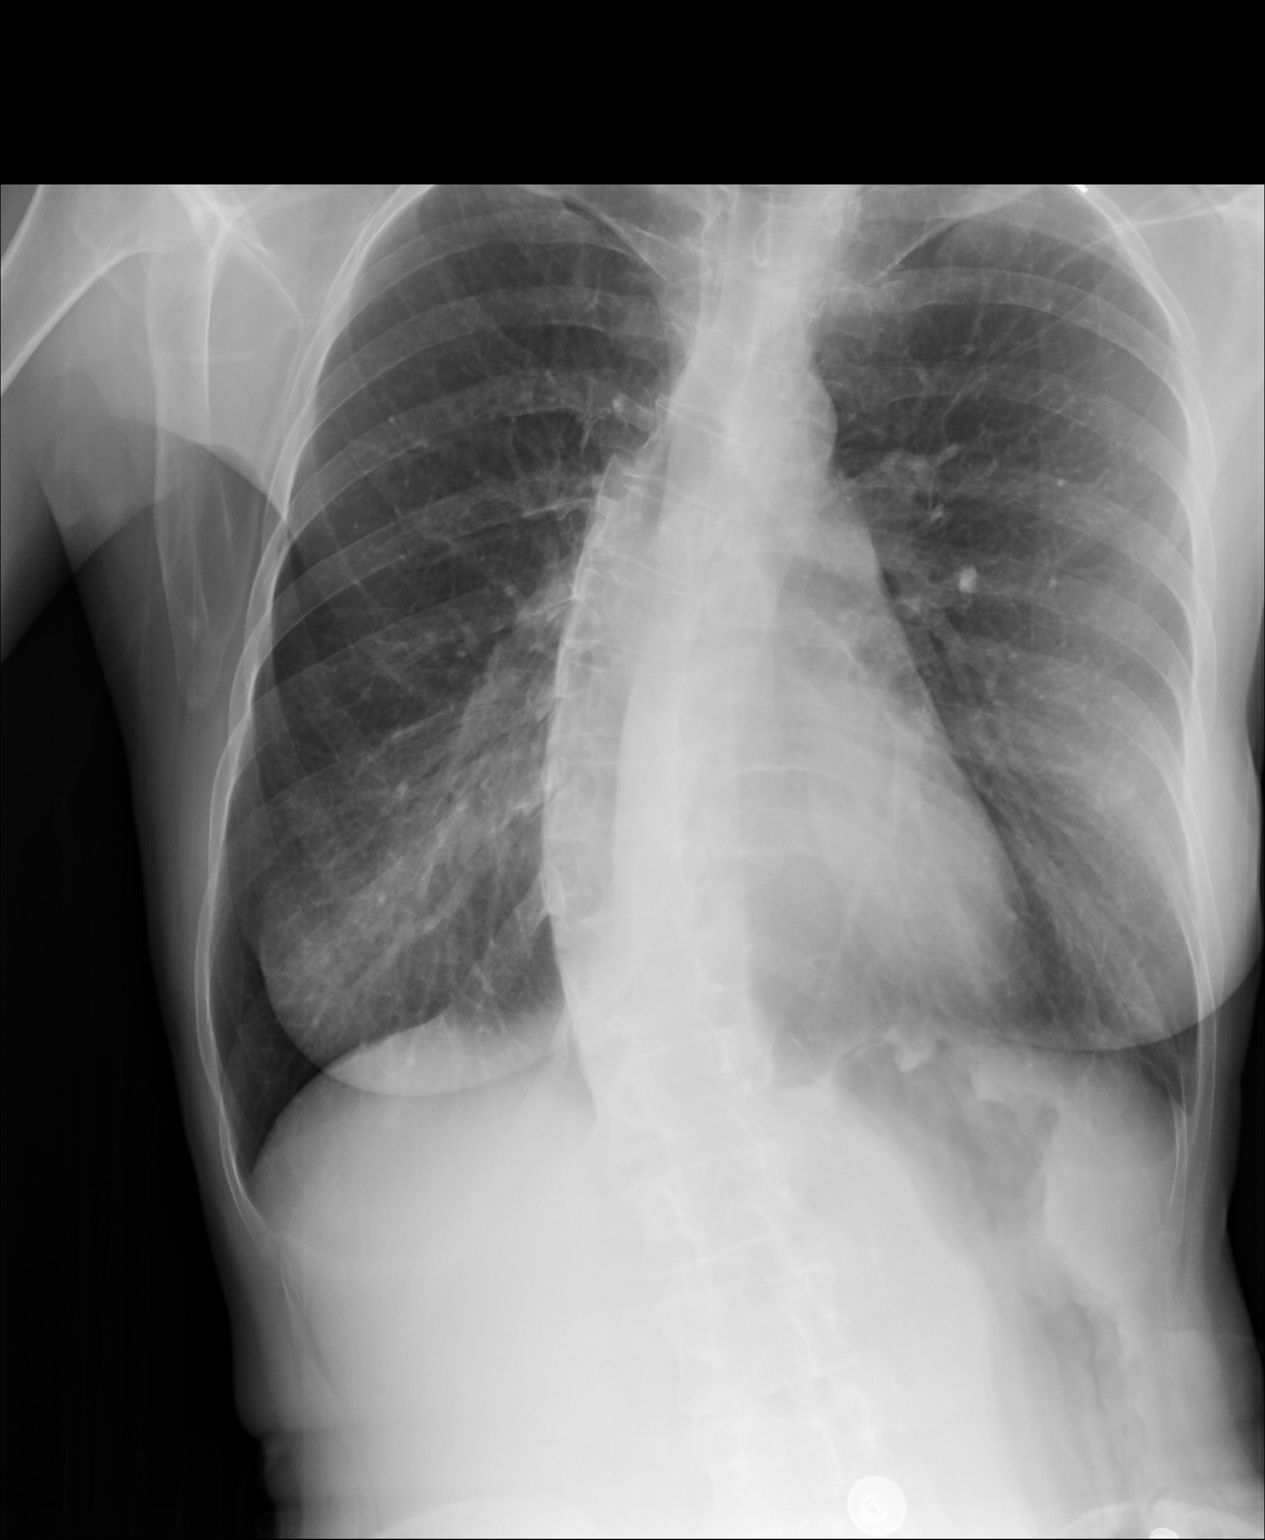
[im 2/2]
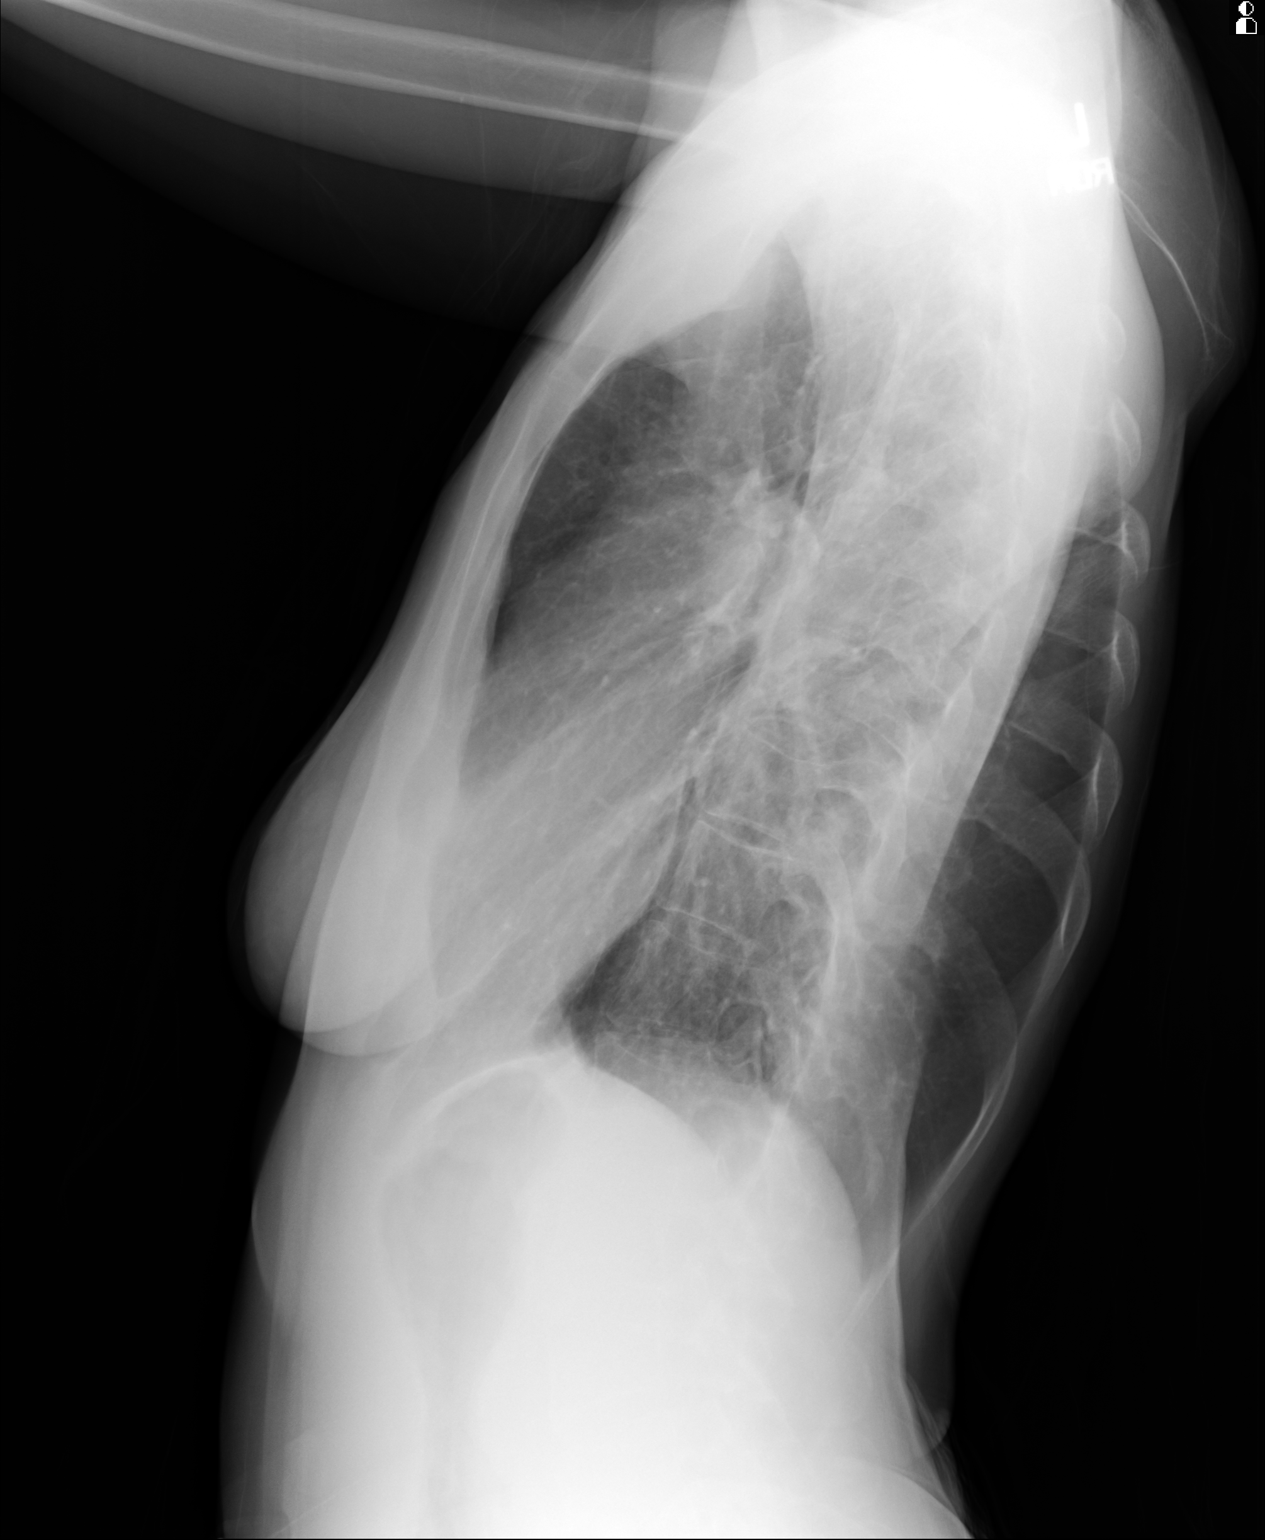

[2 of 2 positions shown; findings below may reference images not displayed]

PROCEDURE:     KDR - KDXR CHEST PA (OR AP) AND LAT  - [DATE]  [DATE]

RESULT:     Comparison is made to the study [DATE].

There is prompt thoracolumbar scoliosis with the thoracic curvature convex
toward the right. The lungs are hyperinflated. There is no focal infiltrate.
The cardiac silhouette is normal in size and the pulmonary vascularity is
not engorged. An area of density in the left lower lobe laterally has nearly
totally cleared since the previous study.
IMPRESSION: I do not see suspicious pulmonary parenchymal masses nor
evidence of pneumonia. There is hyperinflation consistent with COPD.

The area of abnormality in the left lower lung demonstrated on [DATE] has nearly totally resolved. One additional followup film an
approximately 3 to 4 weeks would be useful.

[REDACTED]

## 2012-10-28 ENCOUNTER — Ambulatory Visit: Payer: Self-pay | Admitting: Internal Medicine

## 2012-10-28 IMAGING — CT CT CHEST W/O CM
1 of 2 series · 14 of 32 positions shown, 18 images · non-contrast
Comparison: none

REASON FOR EXAM: pneumonia
COMMENTS:

[Series 2: chest w/o 3.0 i31f 2 · axial · non-contrast · 0.66mm/px · z∈[-757,-478]mm · 14 of 111 slices shown, 18 images]
[im 9/111  mediastinal]
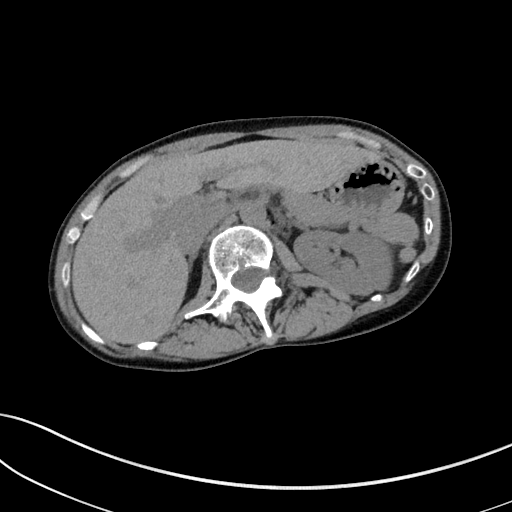
[im 9/111  lung]
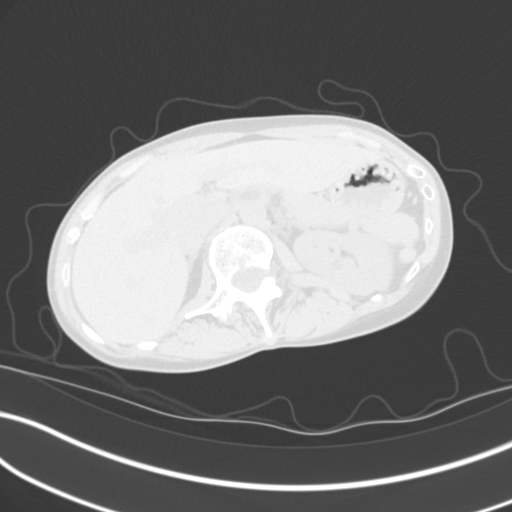
[im 17/111  lung]
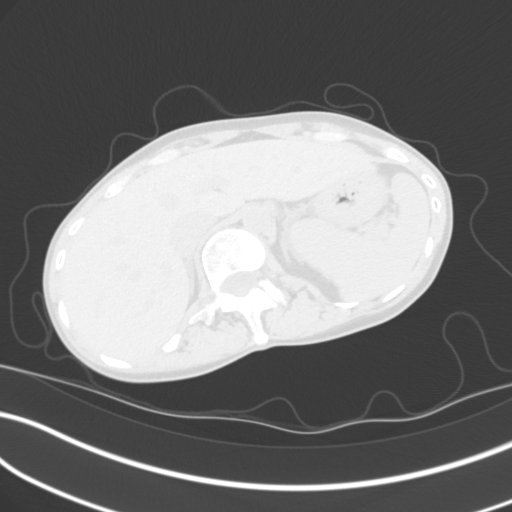
[im 26/111  lung]
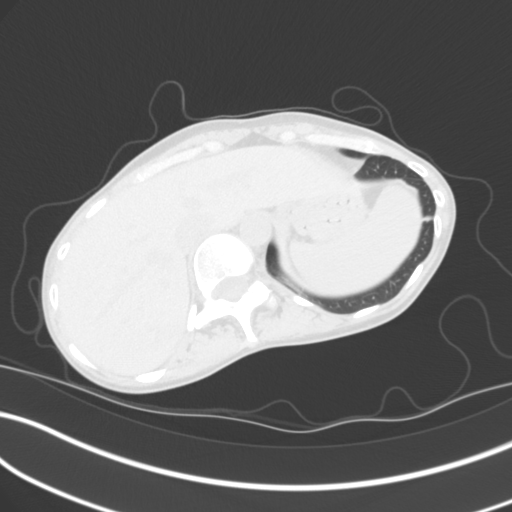
[im 34/111  lung]
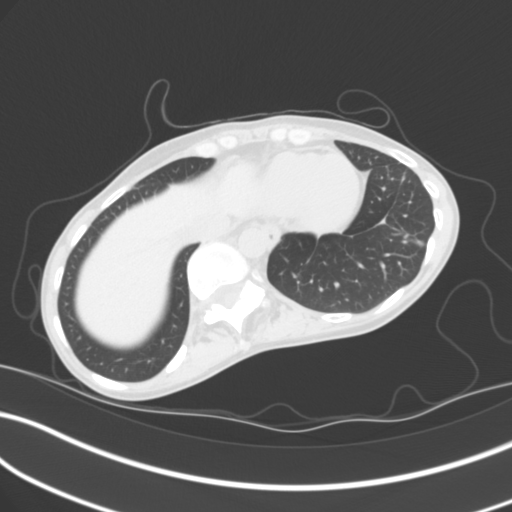
[im 43/111  mediastinal]
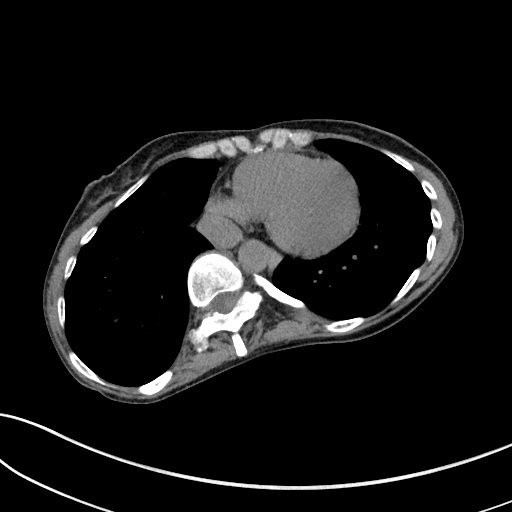
[im 43/111  lung]
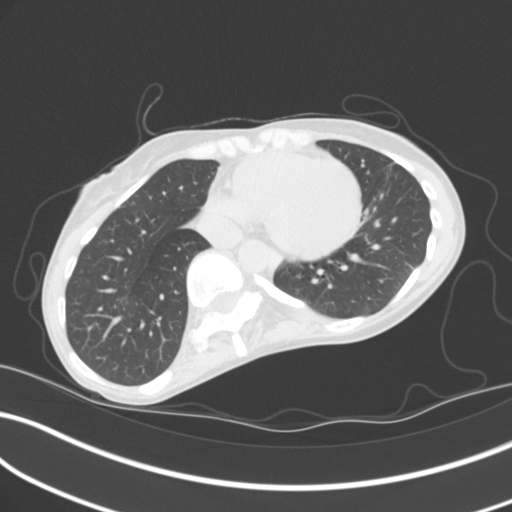
[im 51/111  lung]
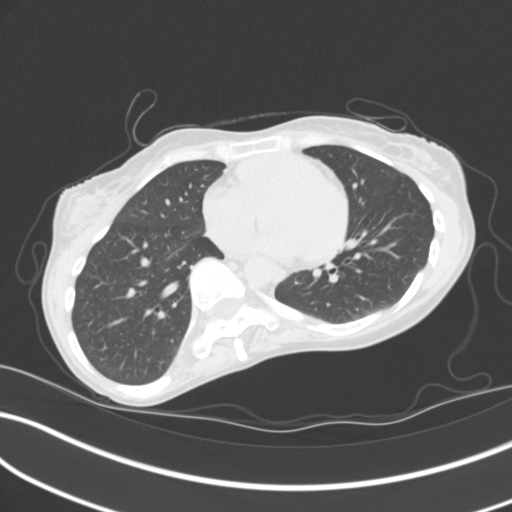
[im 53/111  lung]
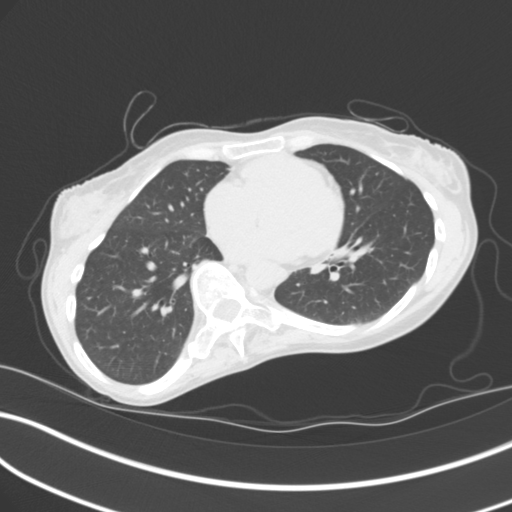
[im 56/111  lung]
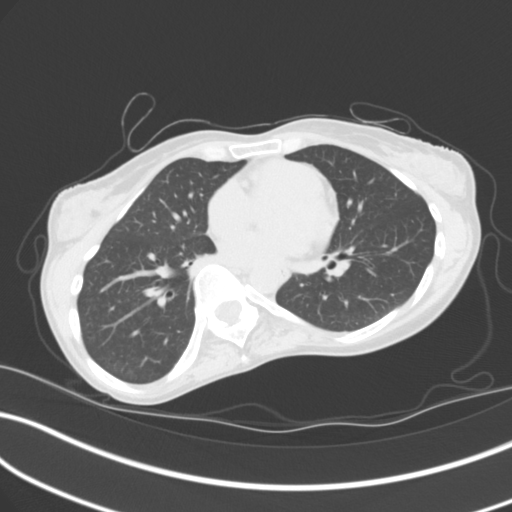
[im 60/111  mediastinal]
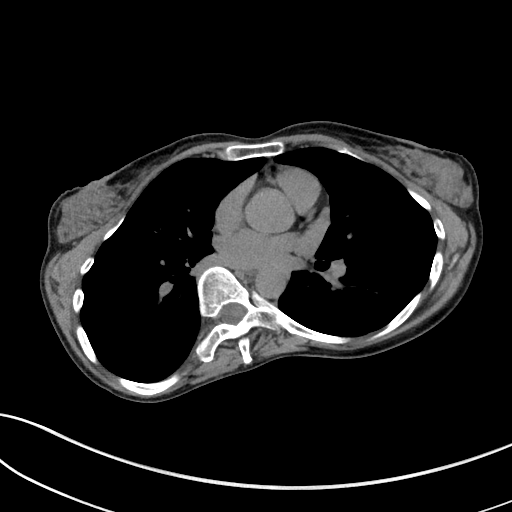
[im 60/111  lung]
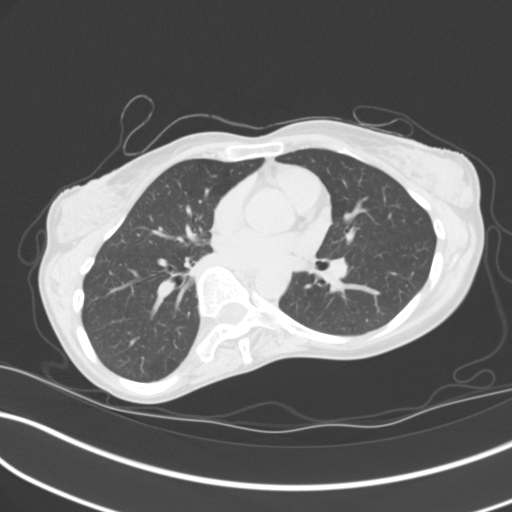
[im 68/111  lung]
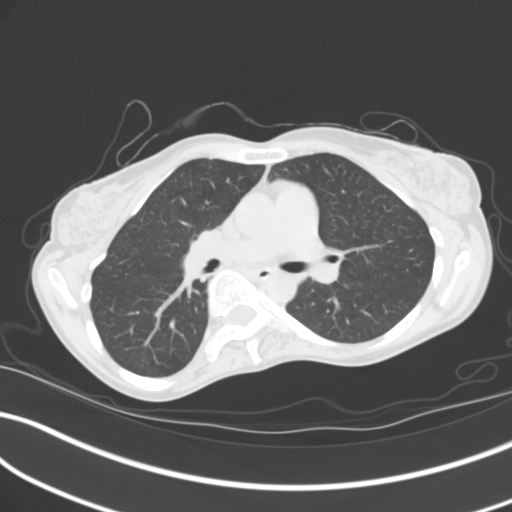
[im 77/111  lung]
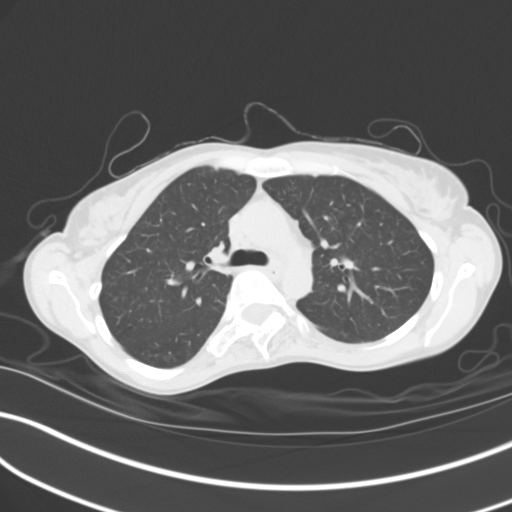
[im 85/111  lung]
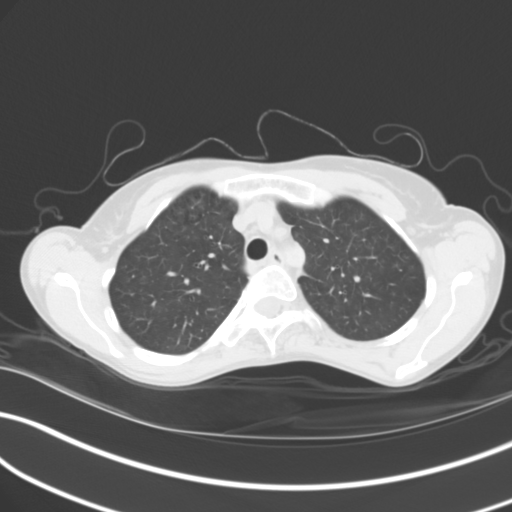
[im 94/111  mediastinal]
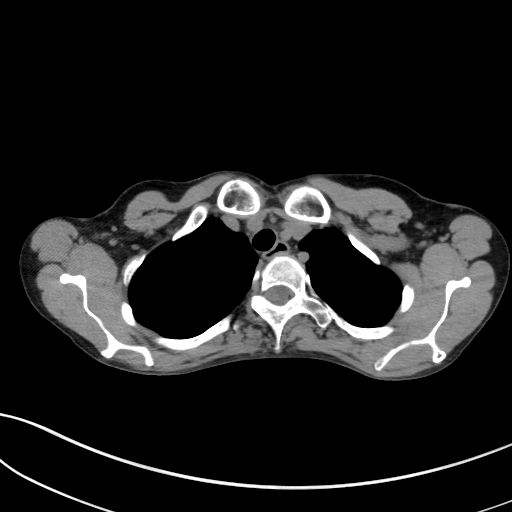
[im 94/111  lung]
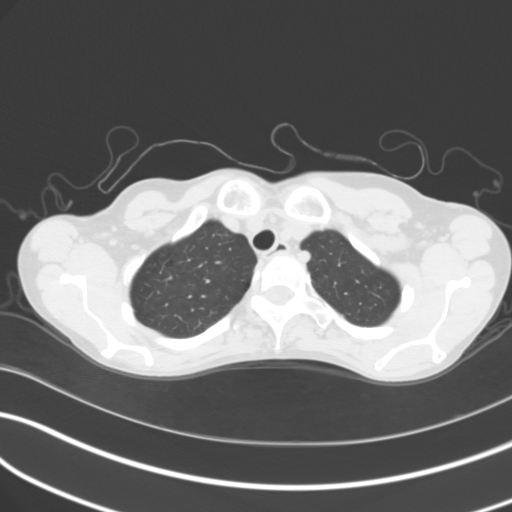
[im 102/111  lung]
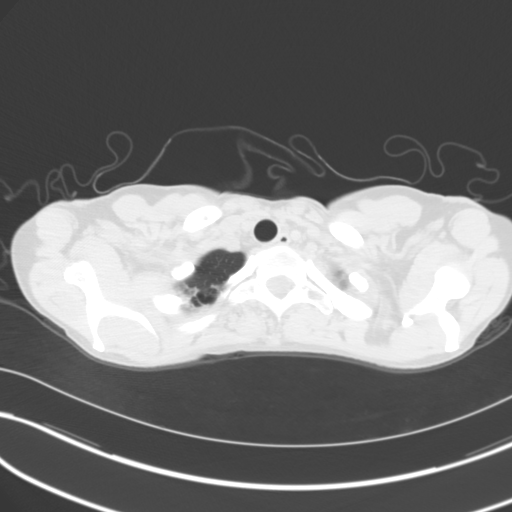

[14 of 32 positions shown; findings below may reference images not displayed]

PROCEDURE:     KCT - KCT CHEST WITHOUT CONTRAST  - [DATE]  [DATE]

RESULT:     Axial noncontrast CT scanning was performed through the chest
with reconstructions at 3 mm intervals and slice thicknesses. Review of
multiplanar reconstructed images was performed separately on the VIA
monitor. Comparison is made to the study [DATE].

At lung window settings there are no interstitial nor alveolar infiltrates.
There is a small amount of apical pleural scarring bilaterally which is
stable. There are no pulmonary parenchymal masses or abnormal nodules. There
is no evidence of bronchiectasis. There is thoracic scoliosis with the
convexity toward the right which does distort thoracic cage anatomy.

At mediastinal window settings the cardiac chambers are normal in size. The
caliber of the thoracic aorta is normal. No bulky mediastinal or hilar or
axillary lymph nodes are evident. The thoracic esophagus is of normal
caliber.

Within the upper abdomen the observed portions of the liver and spleen
exhibit no acute abnormalities. There are no adrenal masses.
IMPRESSION: 1. There is no evidence of pneumonia nor other acute pulmonary parenchymal
abnormality. The left lower lobe abnormalities previously demonstrated have
cleared.
2. There is no mediastinal nor hilar lymphadenopathy.
3. There is no pleural nor pericardial effusion. There is no evidence of
CHF.

[REDACTED]

## 2013-09-17 ENCOUNTER — Emergency Department: Payer: Self-pay | Admitting: Emergency Medicine

## 2014-07-12 ENCOUNTER — Encounter (HOSPITAL_COMMUNITY): Payer: Self-pay | Admitting: *Deleted

## 2015-03-25 ENCOUNTER — Encounter: Payer: Self-pay | Admitting: Family Medicine

## 2015-03-25 ENCOUNTER — Ambulatory Visit (INDEPENDENT_AMBULATORY_CARE_PROVIDER_SITE_OTHER): Payer: 59 | Admitting: Family Medicine

## 2015-03-25 VITALS — BP 100/68 | HR 65 | Temp 97.5°F | Ht 64.5 in | Wt 119.0 lb

## 2015-03-25 DIAGNOSIS — Z90710 Acquired absence of both cervix and uterus: Secondary | ICD-10-CM | POA: Diagnosis not present

## 2015-03-25 DIAGNOSIS — Z0000 Encounter for general adult medical examination without abnormal findings: Secondary | ICD-10-CM

## 2015-03-25 DIAGNOSIS — E28319 Asymptomatic premature menopause: Secondary | ICD-10-CM | POA: Diagnosis not present

## 2015-03-25 DIAGNOSIS — Z1239 Encounter for other screening for malignant neoplasm of breast: Secondary | ICD-10-CM

## 2015-03-25 DIAGNOSIS — Z720 Tobacco use: Secondary | ICD-10-CM

## 2015-03-25 DIAGNOSIS — R238 Other skin changes: Secondary | ICD-10-CM

## 2015-03-25 DIAGNOSIS — R197 Diarrhea, unspecified: Secondary | ICD-10-CM

## 2015-03-25 DIAGNOSIS — K068 Other specified disorders of gingiva and edentulous alveolar ridge: Secondary | ICD-10-CM

## 2015-03-25 DIAGNOSIS — R233 Spontaneous ecchymoses: Secondary | ICD-10-CM

## 2015-03-25 DIAGNOSIS — K649 Unspecified hemorrhoids: Secondary | ICD-10-CM

## 2015-03-25 DIAGNOSIS — K055 Other periodontal diseases: Secondary | ICD-10-CM

## 2015-03-25 NOTE — Patient Instructions (Addendum)
Please do self-breast exams monthly and let us know right away of any new lumps Let's get DEXA scan now (bone density) to see if you have thin bones We'll start estrogen therapy after your mammogram report comes back Try to limit ibuprofen (as that can increase bruising) I really really encourage you to quit smoking (smoking increases your risk of many cancers, heart attack, stroke, leukemia, peripheral vascular disease, etc.)      Health Maintenance Adopting a healthy lifestyle and getting preventive care can go a long way to promote health and wellness. Talk with your health care provider about what schedule of regular examinations is right for you. This is a good chance for you to check in with your provider about disease prevention and staying healthy. In between checkups, there are plenty of things you can do on your own. Experts have done a lot of research about which lifestyle changes and preventive measures are most likely to keep you healthy. Ask your health care provider for more information. WEIGHT AND DIET  Eat a healthy diet  Be sure to include plenty of vegetables, fruits, low-fat dairy products, and lean protein.  Do not eat a lot of foods high in solid fats, added sugars, or salt.  Get regular exercise. This is one of the most important things you can do for your health.  Most adults should exercise for at least 150 minutes each week. The exercise should increase your heart rate and make you sweat (moderate-intensity exercise).  Most adults should also do strengthening exercises at least twice a week. This is in addition to the moderate-intensity exercise.  Maintain a healthy weight  Body mass index (BMI) is a measurement that can be used to identify possible weight problems. It estimates body fat based on height and weight. Your health care provider can help determine your BMI and help you achieve or maintain a healthy weight.  For females 83 years of age and older:    A BMI below 18.5 is considered underweight.  A BMI of 18.5 to 24.9 is normal.  A BMI of 25 to 29.9 is considered overweight.  A BMI of 30 and above is considered obese.  Watch levels of cholesterol and blood lipids  You should start having your blood tested for lipids and cholesterol at 45 years of age, then have this test every 5 years.  You may need to have your cholesterol levels checked more often if:  Your lipid or cholesterol levels are high.  You are older than 45 years of age.  You are at high risk for heart disease.  CANCER SCREENING   Lung Cancer  Lung cancer screening is recommended for adults 36-14 years old who are at high risk for lung cancer because of a history of smoking.  A yearly low-dose CT scan of the lungs is recommended for people who:  Currently smoke.  Have quit within the past 15 years.  Have at least a 30-pack-year history of smoking. A pack year is smoking an average of one pack of cigarettes a day for 1 year.  Yearly screening should continue until it has been 15 years since you quit.  Yearly screening should stop if you develop a health problem that would prevent you from having lung cancer treatment.  Breast Cancer  Practice breast self-awareness. This means understanding how your breasts normally appear and feel.  It also means doing regular breast self-exams. Let your health care provider know about any changes, no matter how small.  If you are in your 20s or 30s, you should have a clinical breast exam (CBE) by a health care provider every 1-3 years as part of a regular health exam.  If you are 56 or older, have a CBE every year. Also consider having a breast X-ray (mammogram) every year.  If you have a family history of breast cancer, talk to your health care provider about genetic screening.  If you are at high risk for breast cancer, talk to your health care provider about having an MRI and a mammogram every year.  Breast  cancer gene (BRCA) assessment is recommended for women who have family members with BRCA-related cancers. BRCA-related cancers include:  Breast.  Ovarian.  Tubal.  Peritoneal cancers.  Results of the assessment will determine the need for genetic counseling and BRCA1 and BRCA2 testing. Cervical Cancer Routine pelvic examinations to screen for cervical cancer are no longer recommended for nonpregnant women who are considered low risk for cancer of the pelvic organs (ovaries, uterus, and vagina) and who do not have symptoms. A pelvic examination may be necessary if you have symptoms including those associated with pelvic infections. Ask your health care provider if a screening pelvic exam is right for you.   The Pap test is the screening test for cervical cancer for women who are considered at risk.  If you had a hysterectomy for a problem that was not cancer or a condition that could lead to cancer, then you no longer need Pap tests.  If you are older than 65 years, and you have had normal Pap tests for the past 10 years, you no longer need to have Pap tests.  If you have had past treatment for cervical cancer or a condition that could lead to cancer, you need Pap tests and screening for cancer for at least 20 years after your treatment.  If you no longer get a Pap test, assess your risk factors if they change (such as having a new sexual partner). This can affect whether you should start being screened again.  Some women have medical problems that increase their chance of getting cervical cancer. If this is the case for you, your health care provider may recommend more frequent screening and Pap tests.  The human papillomavirus (HPV) test is another test that may be used for cervical cancer screening. The HPV test looks for the virus that can cause cell changes in the cervix. The cells collected during the Pap test can be tested for HPV.  The HPV test can be used to screen women 58 years  of age and older. Getting tested for HPV can extend the interval between normal Pap tests from three to five years.  An HPV test also should be used to screen women of any age who have unclear Pap test results.  After 45 years of age, women should have HPV testing as often as Pap tests.  Colorectal Cancer  This type of cancer can be detected and often prevented.  Routine colorectal cancer screening usually begins at 45 years of age and continues through 45 years of age.  Your health care provider may recommend screening at an earlier age if you have risk factors for colon cancer.  Your health care provider may also recommend using home test kits to check for hidden blood in the stool.  A small camera at the end of a tube can be used to examine your colon directly (sigmoidoscopy or colonoscopy). This is done to check for  the earliest forms of colorectal cancer.  Routine screening usually begins at age 68.  Direct examination of the colon should be repeated every 5-10 years through 45 years of age. However, you may need to be screened more often if early forms of precancerous polyps or small growths are found. Skin Cancer  Check your skin from head to toe regularly.  Tell your health care provider about any new moles or changes in moles, especially if there is a change in a mole's shape or color.  Also tell your health care provider if you have a mole that is larger than the size of a pencil eraser.  Always use sunscreen. Apply sunscreen liberally and repeatedly throughout the day.  Protect yourself by wearing long sleeves, pants, a wide-brimmed hat, and sunglasses whenever you are outside. HEART DISEASE, DIABETES, AND HIGH BLOOD PRESSURE   Have your blood pressure checked at least every 1-2 years. High blood pressure causes heart disease and increases the risk of stroke.  If you are between 53 years and 8 years old, ask your health care provider if you should take aspirin to  prevent strokes.  Have regular diabetes screenings. This involves taking a blood sample to check your fasting blood sugar level.  If you are at a normal weight and have a low risk for diabetes, have this test once every three years after 44 years of age.  If you are overweight and have a high risk for diabetes, consider being tested at a younger age or more often. PREVENTING INFECTION  Hepatitis B  If you have a higher risk for hepatitis B, you should be screened for this virus. You are considered at high risk for hepatitis B if:  You were born in a country where hepatitis B is common. Ask your health care provider which countries are considered high risk.  Your parents were born in a high-risk country, and you have not been immunized against hepatitis B (hepatitis B vaccine).  You have HIV or AIDS.  You use needles to inject street drugs.  You live with someone who has hepatitis B.  You have had sex with someone who has hepatitis B.  You get hemodialysis treatment.  You take certain medicines for conditions, including cancer, organ transplantation, and autoimmune conditions. Hepatitis C  Blood testing is recommended for:  Everyone born from [birth year] through 1966.  Anyone with known risk factors for hepatitis C. Sexually transmitted infections (STIs)  You should be screened for sexually transmitted infections (STIs) including gonorrhea and chlamydia if:  You are sexually active and are younger than 45 years of age.  You are older than 45 years of age and your health care provider tells you that you are at risk for this type of infection.  Your sexual activity has changed since you were last screened and you are at an increased risk for chlamydia or gonorrhea. Ask your health care provider if you are at risk.  If you do not have HIV, but are at risk, it may be recommended that you take a prescription medicine daily to prevent HIV infection. This is called pre-exposure  prophylaxis (PrEP). You are considered at risk if:  You are sexually active and do not regularly use condoms or know the HIV status of your partner(s).  You take drugs by injection.  You are sexually active with a partner who has HIV. Talk with your health care provider about whether you are at high risk of being infected with HIV. If you  choose to begin PrEP, you should first be tested for HIV. You should then be tested every 3 months for as long as you are taking PrEP.  PREGNANCY   If you are premenopausal and you may become pregnant, ask your health care provider about preconception counseling.  If you may become pregnant, take 400 to 800 micrograms (mcg) of folic acid every day.  If you want to prevent pregnancy, talk to your health care provider about birth control (contraception). OSTEOPOROSIS AND MENOPAUSE   Osteoporosis is a disease in which the bones lose minerals and strength with aging. This can result in serious bone fractures. Your risk for osteoporosis can be identified using a bone density scan.  If you are 15 years of age or older, or if you are at risk for osteoporosis and fractures, ask your health care provider if you should be screened.  Ask your health care provider whether you should take a calcium or vitamin D supplement to lower your risk for osteoporosis.  Menopause may have certain physical symptoms and risks.  Hormone replacement therapy may reduce some of these symptoms and risks. Talk to your health care provider about whether hormone replacement therapy is right for you.  HOME CARE INSTRUCTIONS   Schedule regular health, dental, and eye exams.  Stay current with your immunizations.   Do not use any tobacco products including cigarettes, chewing tobacco, or electronic cigarettes.  If you are pregnant, do not drink alcohol.  If you are breastfeeding, limit how much and how often you drink alcohol.  Limit alcohol intake to no more than 1 drink per  day for nonpregnant women. One drink equals 12 ounces of beer, 5 ounces of wine, or 1 ounces of hard liquor.  Do not use street drugs.  Do not share needles.  Ask your health care provider for help if you need support or information about quitting drugs.  Tell your health care provider if you often feel depressed.  Tell your health care provider if you have ever been abused or do not feel safe at home. Document Released: 03/12/2011 Document Revised: 01/11/2014 Document Reviewed: 07/29/2013 [facility] Patient Information 2015 [place], [place]. This information is not intended to replace advice given to you by your health care provider. Make sure you discuss any questions you have with your health care provider. Smoking Cessation, Tips for Success If you are ready to quit smoking, congratulations! You have chosen to help yourself be healthier. Cigarettes bring nicotine, tar, carbon monoxide, and other irritants into your body. Your lungs, heart, and blood vessels will be able to work better without these poisons. There are many different ways to quit smoking. Nicotine gum, nicotine patches, a nicotine inhaler, or nicotine nasal spray can help with physical craving. Hypnosis, support groups, and medicines help break the habit of smoking. WHAT THINGS CAN I DO TO MAKE QUITTING EASIER?  Here are some tips to help you quit for good:  Pick a date when you will quit smoking completely. Tell all of your friends and family about your plan to quit on that date.  Do not try to slowly cut down on the number of cigarettes you are smoking. Pick a quit date and quit smoking completely starting on that day.  Throw away all cigarettes.   Clean and remove all ashtrays from your home, work, and car.  On a card, write down your reasons for quitting. Carry the card with you and read it when you get the urge to smoke.  Cleanse your body of nicotine. Drink enough water and fluids to keep your urine clear or pale  yellow. Do this after quitting to flush the nicotine from your body.  Learn to predict your moods. Do not let a bad situation be your excuse to have a cigarette. Some situations in your life might tempt you into wanting a cigarette.  Never have "just one" cigarette. It leads to wanting another and another. Remind yourself of your decision to quit.  Change habits associated with smoking. If you smoked while driving or when feeling stressed, try other activities to replace smoking. Stand up when drinking your coffee. Brush your teeth after eating. Sit in a different chair when you read the paper. Avoid alcohol while trying to quit, and try to drink fewer caffeinated beverages. Alcohol and caffeine may urge you to smoke.  Avoid foods and drinks that can trigger a desire to smoke, such as sugary or spicy foods and alcohol.  Ask people who smoke not to smoke around you.  Have something planned to do right after eating or having a cup of coffee. For example, plan to take a walk or exercise.  Try a relaxation exercise to calm you down and decrease your stress. Remember, you may be tense and nervous for the first 2 weeks after you quit, but this will pass.  Find new activities to keep your hands busy. Play with a pen, coin, or rubber band. Doodle or draw things on paper.  Brush your teeth right after eating. This will help cut down on the craving for the taste of tobacco after meals. You can also try mouthwash.   Use oral substitutes in place of cigarettes. Try using lemon drops, carrots, cinnamon sticks, or chewing gum. Keep them handy so they are available when you have the urge to smoke.  When you have the urge to smoke, try deep breathing.  Designate your home as a nonsmoking area.  If you are a heavy smoker, ask your health care provider about a prescription for nicotine chewing gum. It can ease your withdrawal from nicotine.  Reward yourself. Set aside the cigarette money you save and buy  yourself something nice.  Look for support from others. Join a support group or smoking cessation program. Ask someone at home or at work to help you with your plan to quit smoking.  Always ask yourself, "Do I need this cigarette or is this just a reflex?" Tell yourself, "Today, I choose not to smoke," or "I do not want to smoke." You are reminding yourself of your decision to quit.  Do not replace cigarette smoking with electronic cigarettes (commonly called e-cigarettes). The safety of e-cigarettes is unknown, and some may contain harmful chemicals.  If you relapse, do not give up! Plan ahead and think about what you will do the next time you get the urge to smoke. HOW WILL I FEEL WHEN I QUIT SMOKING? You may have symptoms of withdrawal because your body is used to nicotine (the addictive substance in cigarettes). You may crave cigarettes, be irritable, feel very hungry, cough often, get headaches, or have difficulty concentrating. The withdrawal symptoms are only temporary. They are strongest when you first quit but will go away within 10-14 days. When withdrawal symptoms occur, stay in control. Think about your reasons for quitting. Remind yourself that these are signs that your body is healing and getting used to being without cigarettes. Remember that withdrawal symptoms are easier to treat than the major diseases that  smoking can cause.  Even after the withdrawal is over, expect periodic urges to smoke. However, these cravings are generally short lived and will go away whether you smoke or not. Do not smoke! WHAT RESOURCES ARE AVAILABLE TO HELP ME QUIT SMOKING? Your health care provider can direct you to community resources or hospitals for support, which may include:  Group support.  Education.  Hypnosis.  Therapy. Document Released: 05/25/2004 Document Revised: 01/11/2014 Document Reviewed: 02/12/2013 [facility] Patient Information 2015 [place], [place]. This information is not intended  to replace advice given to you by your health care provider. Make sure you discuss any questions you have with your health care provider.

## 2015-03-25 NOTE — Progress Notes (Signed)
BP 100/68 mmHg  Pulse 65  Temp(Src) 97.5 F (36.4 C)  Ht 5' 4.5" (1.638 m)  Wt 119 lb (53.978 kg)  BMI 20.12 kg/m2  SpO2 98%   Subjective:    Patient ID: Kathryn Fuller, female    DOB: [birth year], 44 y.o.   MRN: [id number]  HPI: Kathryn Fuller is a 44 y.o. female  Chief Complaint  Patient presents with  . Annual Exam   S/p complete hysterectomy with BSO for endometriosis; no hx of abnormal paps or cancer prior Doing self-breast exams? No; no new lumps; has lumpy breasts anyway; had a mammogram a few years ago; first was around age 30; turned out to be okay she says Exercises at work, busy at work for [employer] Does not eat breakfast; typical lunch might be taco, candy bar, whatever she can grab; dinner might be healthier (husband has HTN, eating healthier, less fast food) She eats dairy and milk and cheese Has scoliosis She continues to smoke; not ready to quit No known osteoporosis in the family Relevant past medical, surgical, family and social history reviewed and updated as indicated. Interim medical history since our last visit reviewed. Allergies and medications reviewed and updated.  Review of Systems  Constitutional: Negative for fever and unexpected weight change (gaining a little weight, but not worrisome).  HENT: Negative for nosebleeds.        Gum bleeding with tooth brushing  Respiratory: Negative for shortness of breath.   Gastrointestinal: Positive for diarrhea (loose stools and urgency) and blood in stool (only with wiping with hemorrhoids).  Endocrine: Positive for cold intolerance. Negative for heat intolerance, polydipsia and polyuria.  Genitourinary: Positive for urgency (cannot hold bladder like before). Negative for dysuria, vaginal bleeding, vaginal discharge and pelvic pain (occasional pelvic pain, every six months to a year; been a while since then felt; using KY jelly because of dryness).  Musculoskeletal: Positive for back pain  (scoliosis, back pain).       Reached for cat and whole left side of ribs popped; helped by ibuprofen; hurts lower left side, still going; does not hurt to take a deep breath  Allergic/Immunologic: Negative for environmental allergies and food allergies.  Neurological: Positive for headaches (occasional headaches, nothing worrisome, takes ibuprofen or tylenol).  Hematological: Negative for adenopathy. Bruises/bleeds easily.  Psychiatric/Behavioral: Negative for dysphoric mood. The patient is not nervous/anxious.    Per HPI unless specifically indicated above     Objective:    BP 100/68 mmHg  Pulse 65  Temp(Src) 97.5 F (36.4 C)  Ht 5' 4.5" (1.638 m)  Wt 119 lb (53.978 kg)  BMI 20.12 kg/m2  SpO2 98%  Wt Readings from Last 3 Encounters:  03/25/15 119 lb (53.978 kg)  07/29/12 105 lb (47.628 kg)  09/04/12 105 lb 9.6 oz (47.9 kg)    Physical Exam  Constitutional: She appears well-developed and well-nourished.  HENT:  Head: Normocephalic and atraumatic.  Eyes: Conjunctivae and EOM are normal. Right eye exhibits no hordeolum. Left eye exhibits no hordeolum. No scleral icterus.  Neck: Carotid bruit is not present. No thyromegaly present.  Cardiovascular: Normal rate, regular rhythm, S1 normal, S2 normal and normal heart sounds.   No extrasystoles are present.  Pulmonary/Chest: Effort normal and breath sounds normal. No respiratory distress. Right breast exhibits no inverted nipple, no mass, no nipple discharge, no skin change and no tenderness. Left breast exhibits no inverted nipple, no mass, no nipple discharge, no skin change and no tenderness. Breasts are  symmetrical.  Abdominal: Soft. Normal appearance and bowel sounds are normal. She exhibits no distension, no abdominal bruit, no pulsatile midline mass and no mass. There is no hepatosplenomegaly. There is no tenderness. No hernia.  Genitourinary: Vagina normal. Rectal exam shows no external hemorrhoid, no fissure, no mass, no  tenderness and anal tone normal. Pelvic exam was performed with patient prone. There is no rash or lesion on the right labia. There is no rash or lesion on the left labia.  Musculoskeletal: Normal range of motion. She exhibits no edema.  Lymphadenopathy:       Head (right side): No submandibular adenopathy present.       Head (left side): No submandibular adenopathy present.    She has no cervical adenopathy.    She has no axillary adenopathy.  Neurological: She is alert. She displays no tremor. No cranial nerve deficit. She exhibits normal muscle tone. Gait normal.  Skin: Skin is warm and dry. No bruising and no ecchymosis noted. No cyanosis. No pallor.  Psychiatric: Her speech is normal and behavior is normal. Thought content normal. Her mood appears not anxious. She does not exhibit a depressed mood.      Assessment & Plan:   Problem List Items Addressed This Visit      Other   Tobacco abuse    Patient is not ready to quit; explained multiple consequences of smoking, including leukemia, several types of cancer, wrinkles, premature aging, stroke, heart attack, etc.       Other Visit Diagnoses    Preventative health care    -  Primary    Relevant Orders    Lipid Panel w/o Chol/HDL Ratio (Completed)    Comprehensive metabolic panel (Completed)    HIV-1 RNA ultraquant reflex to gentyp+    Early menopause occurring in patient age younger than 45 years        Relevant Orders    DG Bone Density    Easy bruising        Relevant Orders    CBC with Differential/Platelet (Completed)    PT and PTT (Completed)    Hemorrhoids, unspecified hemorrhoid type        Gums, bleeding        Relevant Orders    CBC with Differential/Platelet (Completed)    PT and PTT (Completed)    Breast cancer screening        Relevant Orders    MM Digital Screening    Frequent loose stools        Relevant Orders    Celiac Disease Ab Screen w/Rfx (Completed)    Status post complete hysterectomy         benign reasons, does not need pap       Follow up plan: Return in about 1 year (around 03/24/2016) for next physical.  Orders Placed This Encounter  Procedures  . DG Bone Density  . MM Digital Screening  . CBC with Differential/Platelet  . Lipid Panel w/o Chol/HDL Ratio  . Comprehensive metabolic panel  . PT and PTT  . Celiac Disease Ab Screen w/Rfx  . HIV-1 RNA ultraquant reflex to gentyp+   An After Visit Summary was printed and given to the patient.

## 2015-03-26 LAB — COMPREHENSIVE METABOLIC PANEL
A/G RATIO: 2 (ref 1.1–2.5)
ALBUMIN: 4.4 g/dL (ref 3.5–5.5)
ALT: 10 IU/L (ref 0–32)
AST: 14 IU/L (ref 0–40)
Alkaline Phosphatase: 75 IU/L (ref 39–117)
BUN / CREAT RATIO: 18 (ref 9–23)
BUN: 13 mg/dL (ref 6–24)
Bilirubin Total: 0.4 mg/dL (ref 0.0–1.2)
CO2: 27 mmol/L (ref 18–29)
CREATININE: 0.72 mg/dL (ref 0.57–1.00)
Calcium: 9.5 mg/dL (ref 8.7–10.2)
Chloride: 102 mmol/L (ref 97–108)
GFR calc Af Amer: 118 mL/min/{1.73_m2} (ref >59)
GFR, EST NON AFRICAN AMERICAN: 102 mL/min/{1.73_m2} (ref >59)
GLOBULIN, TOTAL: 2.2 g/dL (ref 1.5–4.5)
Glucose: 67 mg/dL (ref 65–99)
Potassium: 5 mmol/L (ref 3.5–5.2)
Sodium: 143 mmol/L (ref 134–144)
Total Protein: 6.6 g/dL (ref 6.0–8.5)

## 2015-03-26 LAB — CBC WITH DIFFERENTIAL/PLATELET
BASOS ABS: 0.1 10*3/uL (ref 0.0–0.2)
Basos: 1 %
EOS (ABSOLUTE): 0.1 10*3/uL (ref 0.0–0.4)
Eos: 2 %
Hematocrit: 42.7 % (ref 34.0–46.6)
Hemoglobin: 14.8 g/dL (ref 11.1–15.9)
IMMATURE GRANS (ABS): 0 10*3/uL (ref 0.0–0.1)
IMMATURE GRANULOCYTES: 0 %
LYMPHS ABS: 2.1 10*3/uL (ref 0.7–3.1)
Lymphs: 36 %
MCH: 31.7 pg (ref 26.6–33.0)
MCHC: 34.7 g/dL (ref 31.5–35.7)
MCV: 91 fL (ref 79–97)
MONOS ABS: 0.6 10*3/uL (ref 0.1–0.9)
Monocytes: 9 %
NEUTROS PCT: 52 %
Neutrophils Absolute: 3.1 10*3/uL (ref 1.4–7.0)
PLATELETS: 274 10*3/uL (ref 150–379)
RBC: 4.67 x10E6/uL (ref 3.77–5.28)
RDW: 13.6 % (ref 12.3–15.4)
WBC: 5.9 10*3/uL (ref 3.4–10.8)

## 2015-03-26 LAB — LIPID PANEL W/O CHOL/HDL RATIO
CHOLESTEROL TOTAL: 237 mg/dL — AB (ref 100–199)
HDL: 45 mg/dL (ref >39)
LDL Calculated: 168 mg/dL — ABNORMAL HIGH (ref 0–99)
Triglycerides: 121 mg/dL (ref 0–149)
VLDL CHOLESTEROL CAL: 24 mg/dL (ref 5–40)

## 2015-03-26 LAB — PT AND PTT
INR: 1.1 (ref 0.8–1.2)
Prothrombin Time: 11.2 s (ref 9.1–12.0)
aPTT: 28 s (ref 24–33)

## 2015-03-27 DIAGNOSIS — Z720 Tobacco use: Secondary | ICD-10-CM

## 2015-03-27 HISTORY — DX: Tobacco use: Z72.0

## 2015-03-27 NOTE — Assessment & Plan Note (Signed)
Patient is not ready to quit; explained multiple consequences of smoking, including leukemia, several types of cancer, wrinkles, premature aging, stroke, heart attack, etc.

## 2015-03-28 ENCOUNTER — Encounter: Payer: Self-pay | Admitting: Family Medicine

## 2015-03-28 ENCOUNTER — Ambulatory Visit (INDEPENDENT_AMBULATORY_CARE_PROVIDER_SITE_OTHER): Payer: 59 | Admitting: Family Medicine

## 2015-03-28 VITALS — BP 100/69 | HR 69 | Temp 97.5°F | Wt 120.0 lb

## 2015-03-28 DIAGNOSIS — R238 Other skin changes: Secondary | ICD-10-CM

## 2015-03-28 DIAGNOSIS — R233 Spontaneous ecchymoses: Secondary | ICD-10-CM

## 2015-03-28 DIAGNOSIS — K068 Other specified disorders of gingiva and edentulous alveolar ridge: Secondary | ICD-10-CM

## 2015-03-28 LAB — HIV-1 RNA ULTRAQUANT REFLEX TO GENTYP+

## 2015-03-28 LAB — CELIAC DISEASE AB SCREEN W/RFX: ANTIGLIADIN ABS, IGA: 3 U (ref 0–19)

## 2015-03-28 NOTE — Progress Notes (Signed)
BP 100/69 mmHg  Pulse 69  Temp(Src) 97.5 F (36.4 C)  Wt 120 lb (54.432 kg)  SpO2 96%   Subjective:    Patient ID: Kathryn Fuller, female    DOB: Aug 22, 1971, 44 y.o.   MRN: [id number]  HPI: Kathryn Fuller is a 44 y.o. female  Chief Complaint  Patient presents with  . Bruise    on arm, after blood draw   Patient here because of large dark bruise on her left bicep; came up on Saturday; no nosebleeds, no blood from rectum; she does not recall any hx of terrible, prolonged bleeding after dental work, childbirth, etc.; the bruise on her leg is resolving; we did a PT, PTT, and platelet count last week because of that unexplained bruise  Relevant past medical, surgical, family and social history reviewed and updated as indicated. Interim medical history since our last visit reviewed. Allergies and medications reviewed and updated.  Review of Systems  Per HPI unless specifically indicated above     Objective:    BP 100/69 mmHg  Pulse 69  Temp(Src) 97.5 F (36.4 C)  Wt 120 lb (54.432 kg)  SpO2 96%  Wt Readings from Last 3 Encounters:  03/28/15 120 lb (54.432 kg)  03/25/15 119 lb (53.978 kg)  07/29/12 105 lb (47.628 kg)    Physical Exam  Constitutional: She appears well-developed and well-nourished. No distress.  Pulmonary/Chest: Effort normal.  Skin: Bruising (large longitudinal ecchymosis on the left bicep proximal to venipuncture site) and ecchymosis noted. No purpura noted.  No erythematous streaks, nothing to suggest cellulitis proximal to venipuncture site  Psychiatric: She has a normal mood and affect.    Results for orders placed or performed in visit on 03/25/15  CBC with Differential/Platelet  Result Value Ref Range   WBC 5.9 3.4 - 10.8 x10E3/uL   RBC 4.67 3.77 - 5.28 x10E6/uL   Hemoglobin 14.8 11.1 - 15.9 g/dL   Hematocrit 42.7 34.0 - 46.6 %   MCV 91 79 - 97 fL   MCH 31.7 26.6 - 33.0 pg   MCHC 34.7 31.5 - 35.7 g/dL   RDW 13.6 12.3 - 15.4 %   Platelets 274 150 - 379 x10E3/uL   Neutrophils 52 %   Lymphs 36 %   Monocytes 9 %   Eos 2 %   Basos 1 %   Neutrophils Absolute 3.1 1.4 - 7.0 x10E3/uL   Lymphocytes Absolute 2.1 0.7 - 3.1 x10E3/uL   Monocytes Absolute 0.6 0.1 - 0.9 x10E3/uL   EOS (ABSOLUTE) 0.1 0.0 - 0.4 x10E3/uL   Basophils Absolute 0.1 0.0 - 0.2 x10E3/uL   Immature Granulocytes 0 %   Immature Grans (Abs) 0.0 0.0 - 0.1 x10E3/uL  Lipid Panel w/o Chol/HDL Ratio  Result Value Ref Range   Cholesterol, Total 237 (H) 100 - 199 mg/dL   Triglycerides 121 0 - 149 mg/dL   HDL 45 >39 mg/dL   VLDL Cholesterol Cal 24 5 - 40 mg/dL   LDL Calculated 168 (H) 0 - 99 mg/dL  Comprehensive metabolic panel  Result Value Ref Range   Glucose 67 65 - 99 mg/dL   BUN 13 6 - 24 mg/dL   Creatinine, Ser 0.72 0.57 - 1.00 mg/dL   GFR calc non Af Amer 102 >59 mL/min/1.73   GFR calc Af Amer 118 >59 mL/min/1.73   BUN/Creatinine Ratio 18 9 - 23   Sodium 143 134 - 144 mmol/L   Potassium 5.0 3.5 - 5.2 mmol/L   Chloride  102 97 - 108 mmol/L   CO2 27 18 - 29 mmol/L   Calcium 9.5 8.7 - 10.2 mg/dL   Total Protein 6.6 6.0 - 8.5 g/dL   Albumin 4.4 3.5 - 5.5 g/dL   Globulin, Total 2.2 1.5 - 4.5 g/dL   Albumin/Globulin Ratio 2.0 1.1 - 2.5   Bilirubin Total 0.4 0.0 - 1.2 mg/dL   Alkaline Phosphatase 75 39 - 117 IU/L   AST 14 0 - 40 IU/L   ALT 10 0 - 32 IU/L  PT and PTT  Result Value Ref Range   INR 1.1 0.8 - 1.2   Prothrombin Time 11.2 9.1 - 12.0 sec   aPTT 28 24 - 33 sec  Celiac Disease Ab Screen w/Rfx  Result Value Ref Range   Antigliadin Abs, IgA 3 0 - 19 units  HIV-1 RNA ultraquant reflex to gentyp+  Result Value Ref Range   HIV1 RNA # SerPl PCR <20 copies/mL   HIV1 RNA Plas PCR-Log# CANCELED log10copy/mL   Genotype Assay CANCELED       Assessment & Plan:   Problem List Items Addressed This Visit    None    Visit Diagnoses    Easy bruising    -  Primary    last PT, PTT, platelet normal; check vonWillebrand factor; reviewed s/s to  necessitate further work-up, referral to hematology to check for acquired bleeding do    Relevant Orders    Von Willebrand panel    Bleeding gums        Relevant Orders    Von Willebrand panel       Follow up plan: No Follow-up on file.

## 2015-03-29 ENCOUNTER — Telehealth: Payer: Self-pay | Admitting: Family Medicine

## 2015-03-29 DIAGNOSIS — Z114 Encounter for screening for human immunodeficiency virus [HIV]: Secondary | ICD-10-CM

## 2015-03-29 NOTE — Telephone Encounter (Signed)
A thousand pardons, but I entered the wrong order code for HIV screening; it cannot be added on; please ask the patient to have test done now or in the future (next few months)

## 2015-03-29 NOTE — Telephone Encounter (Signed)
Lattie Haw with the lab is calling the patient to let her know to come back in for labs.

## 2015-03-31 LAB — VON WILLEBRAND PANEL
Factor VIII Activity: 80 % (ref 50–150)
Von Willebrand Ag: 71 % (ref 50–150)
Von Willebrand Factor: 72 % (ref 50–150)

## 2015-03-31 LAB — COAG STUDIES INTERP REPORT: PDF IMAGE: 0

## 2015-04-01 ENCOUNTER — Encounter: Payer: Self-pay | Admitting: Family Medicine

## 2015-06-05 ENCOUNTER — Encounter: Payer: Self-pay | Admitting: Family Medicine

## 2015-06-05 DIAGNOSIS — E785 Hyperlipidemia, unspecified: Secondary | ICD-10-CM

## 2015-06-05 HISTORY — DX: Hyperlipidemia, unspecified: E78.5

## 2015-06-09 ENCOUNTER — Inpatient Hospital Stay
Admission: EM | Admit: 2015-06-09 | Discharge: 2015-06-11 | DRG: 392 | Disposition: A | Discharge status: Home / Self Care | Payer: 59 | Attending: Surgery | Admitting: Surgery

## 2015-06-09 ENCOUNTER — Encounter: Payer: Self-pay | Admitting: Family Medicine

## 2015-06-09 ENCOUNTER — Emergency Department: Payer: 59

## 2015-06-09 ENCOUNTER — Ambulatory Visit (INDEPENDENT_AMBULATORY_CARE_PROVIDER_SITE_OTHER): Payer: 59 | Admitting: Family Medicine

## 2015-06-09 VITALS — BP 106/71 | HR 72 | Temp 97.4°F | Wt 120.0 lb

## 2015-06-09 DIAGNOSIS — M419 Scoliosis, unspecified: Secondary | ICD-10-CM | POA: Diagnosis present

## 2015-06-09 DIAGNOSIS — K5720 Diverticulitis of large intestine with perforation and abscess without bleeding: Principal | ICD-10-CM

## 2015-06-09 DIAGNOSIS — K5732 Diverticulitis of large intestine without perforation or abscess without bleeding: Secondary | ICD-10-CM | POA: Diagnosis present

## 2015-06-09 DIAGNOSIS — R1030 Lower abdominal pain, unspecified: Secondary | ICD-10-CM | POA: Diagnosis present

## 2015-06-09 DIAGNOSIS — F17210 Nicotine dependence, cigarettes, uncomplicated: Secondary | ICD-10-CM | POA: Diagnosis present

## 2015-06-09 DIAGNOSIS — N898 Other specified noninflammatory disorders of vagina: Secondary | ICD-10-CM

## 2015-06-09 LAB — CBC WITH DIFFERENTIAL/PLATELET
Basophils Absolute: 0 10*3/uL (ref 0–0.1)
Basophils Relative: 1 %
EOS ABS: 0.1 10*3/uL (ref 0–0.7)
EOS PCT: 1 %
HCT: 41.6 % (ref 35.0–47.0)
Hemoglobin: 14.2 g/dL (ref 12.0–16.0)
LYMPHS ABS: 2.1 10*3/uL (ref 1.0–3.6)
LYMPHS PCT: 23 %
MCH: 31.4 pg (ref 26.0–34.0)
MCHC: 34.1 g/dL (ref 32.0–36.0)
MCV: 92.2 fL (ref 80.0–100.0)
MONO ABS: 0.7 10*3/uL (ref 0.2–0.9)
Monocytes Relative: 7 %
Neutro Abs: 6.3 10*3/uL (ref 1.4–6.5)
Neutrophils Relative %: 68 %
PLATELETS: 286 10*3/uL (ref 150–440)
RBC: 4.52 MIL/uL (ref 3.80–5.20)
RDW: 12.9 % (ref 11.5–14.5)
WBC: 9.2 10*3/uL (ref 3.6–11.0)

## 2015-06-09 LAB — URINALYSIS COMPLETE WITH MICROSCOPIC (ARMC ONLY)
Bacteria, UA: NONE SEEN
Bilirubin Urine: NEGATIVE
GLUCOSE, UA: NEGATIVE mg/dL
KETONES UR: NEGATIVE mg/dL
Leukocytes, UA: NEGATIVE
Nitrite: NEGATIVE
Protein, ur: NEGATIVE mg/dL
SPECIFIC GRAVITY, URINE: 1.002 — AB (ref 1.005–1.030)
SQUAMOUS EPITHELIAL / LPF: NONE SEEN
pH: 6 (ref 5.0–8.0)

## 2015-06-09 LAB — COMPREHENSIVE METABOLIC PANEL
ALBUMIN: 4 g/dL (ref 3.5–5.0)
ALT: 11 U/L — AB (ref 14–54)
AST: 17 U/L (ref 15–41)
Alkaline Phosphatase: 78 U/L (ref 38–126)
Anion gap: 6 (ref 5–15)
BILIRUBIN TOTAL: 0.6 mg/dL (ref 0.3–1.2)
BUN: 9 mg/dL (ref 6–20)
CHLORIDE: 103 mmol/L (ref 101–111)
CO2: 27 mmol/L (ref 22–32)
CREATININE: 0.65 mg/dL (ref 0.44–1.00)
Calcium: 9.1 mg/dL (ref 8.9–10.3)
GFR calc Af Amer: >60 mL/min (ref >60)
GLUCOSE: 114 mg/dL — AB (ref 65–99)
POTASSIUM: 3.7 mmol/L (ref 3.5–5.1)
Sodium: 136 mmol/L (ref 135–145)
Total Protein: 7.1 g/dL (ref 6.5–8.1)

## 2015-06-09 LAB — UA/M W/RFLX CULTURE, ROUTINE
BILIRUBIN UA: NEGATIVE
GLUCOSE, UA: NEGATIVE
KETONES UA: NEGATIVE
LEUKOCYTES UA: NEGATIVE
Nitrite, UA: NEGATIVE
PROTEIN UA: NEGATIVE
SPEC GRAV UA: 1.01 (ref 1.005–1.030)
Urobilinogen, Ur: 0.2 mg/dL (ref 0.2–1.0)
pH, UA: 6 (ref 5.0–7.5)

## 2015-06-09 LAB — LIPASE, BLOOD: LIPASE: 38 U/L (ref 22–51)

## 2015-06-09 IMAGING — CT CT ABD-PELV W/ CM
2 of 5 series · 15 of 46 positions shown, 17 images · IV contrast (omnipaque)
Comparison: CT urogram [DATE]

CLINICAL DATA: Right lower abdominal pain radiating to the vagina
and left flank. Last bowel movement 4 days ago. Initial encounter.

EXAM:
CT ABDOMEN AND PELVIS WITH CONTRAST
TECHNIQUE: Multidetector CT imaging of the abdomen and pelvis was performed
using the standard protocol following bolus administration of
intravenous contrast.
CONTRAST:  100mL OMNIPAQUE IOHEXOL 300 MG/ML  SOLN

[Series 2: routine abd pel with · axial · 0.67mm/px · z∈[-288,+82]mm · 12 of 84 slices shown, 14 images]
[im 5/84  soft-tissue]
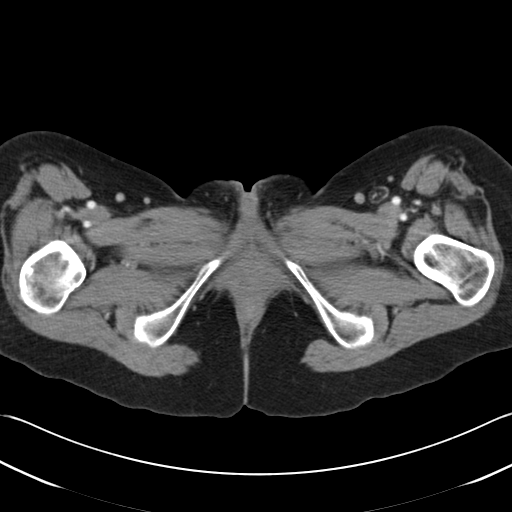
[im 5/84  bone]
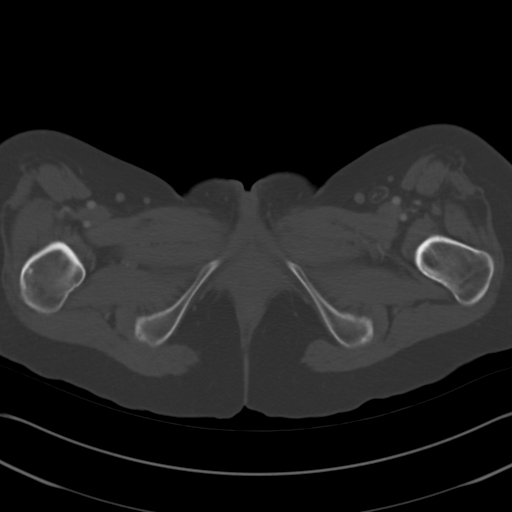
[im 14/84  soft-tissue]
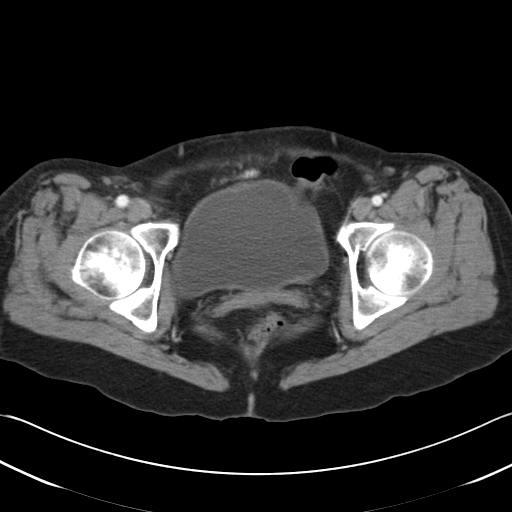
[im 18/84  soft-tissue]
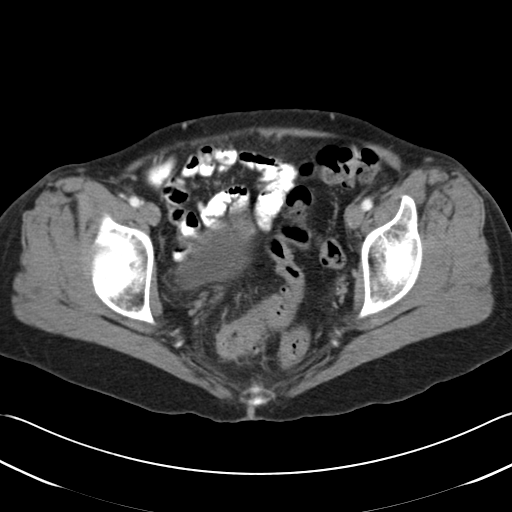
[im 27/84  soft-tissue]
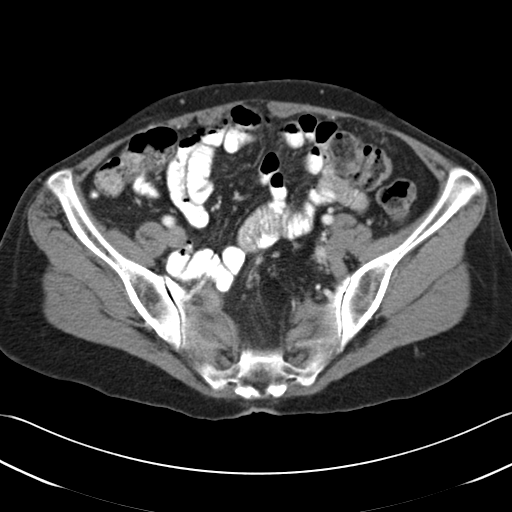
[im 31/84  soft-tissue]
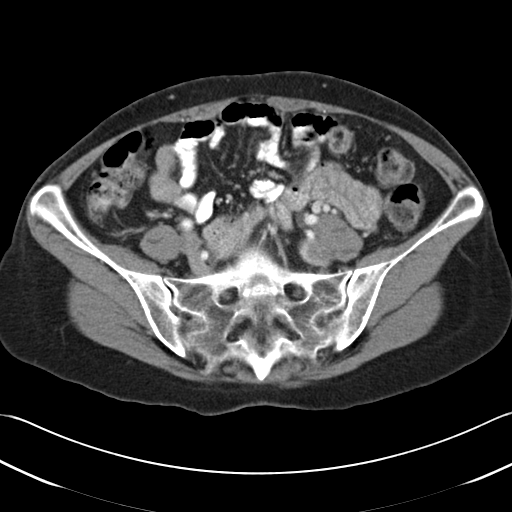
[im 40/84  soft-tissue]
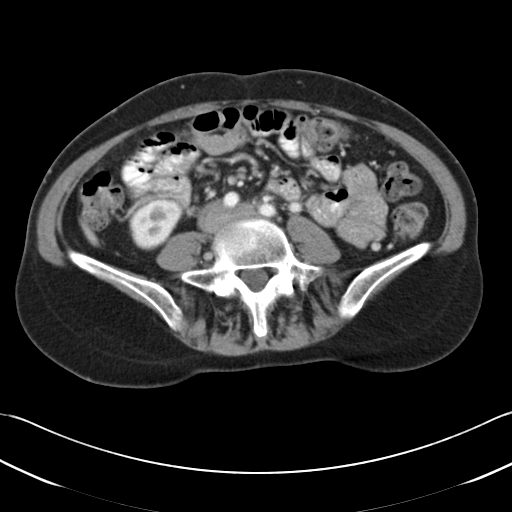
[im 44/84  soft-tissue]
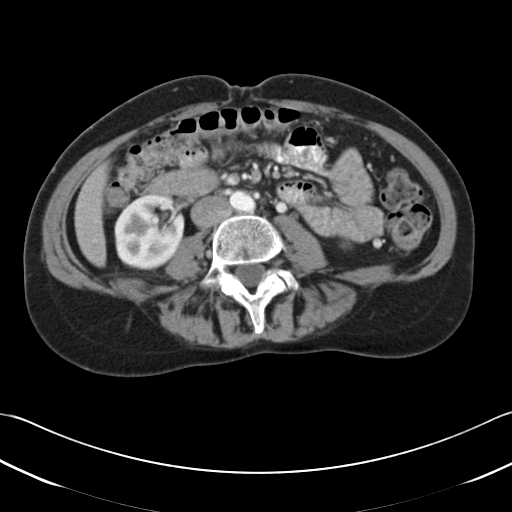
[im 53/84  soft-tissue]
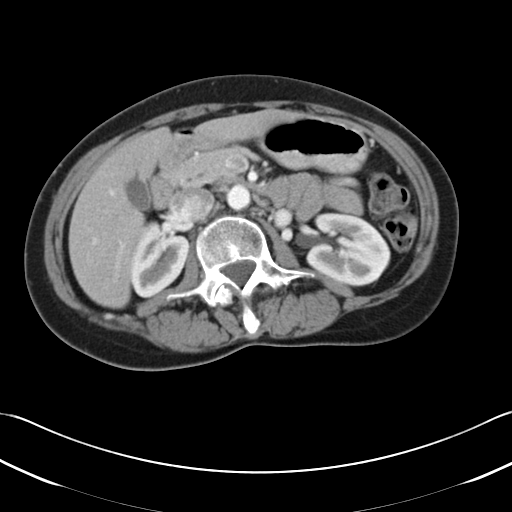
[im 57/84  soft-tissue]
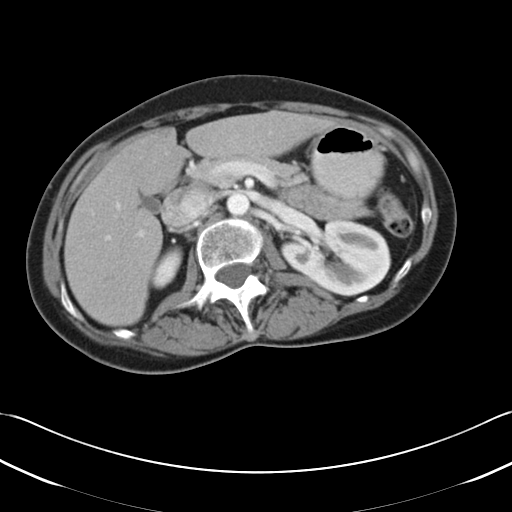
[im 57/84  bone]
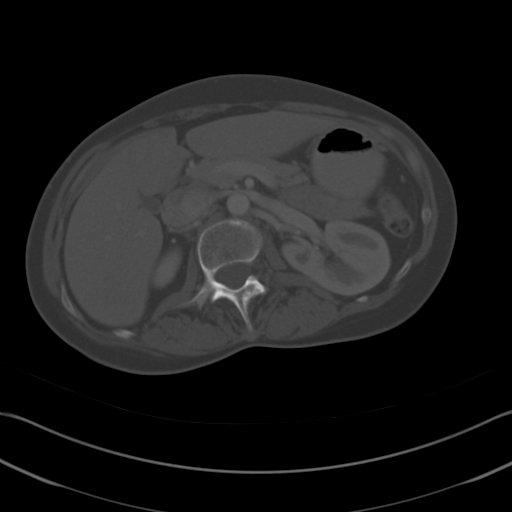
[im 66/84  soft-tissue]
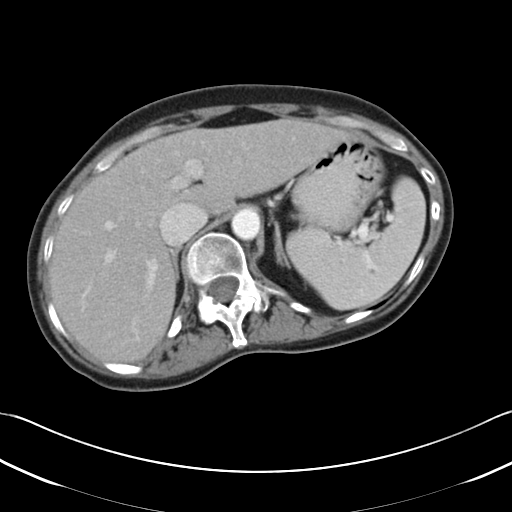
[im 70/84  soft-tissue]
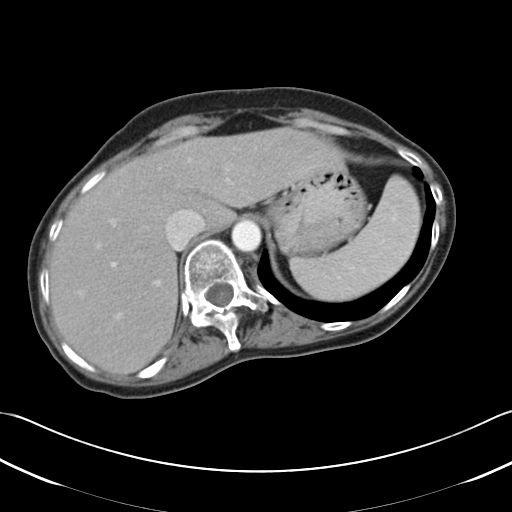
[im 79/84  soft-tissue]
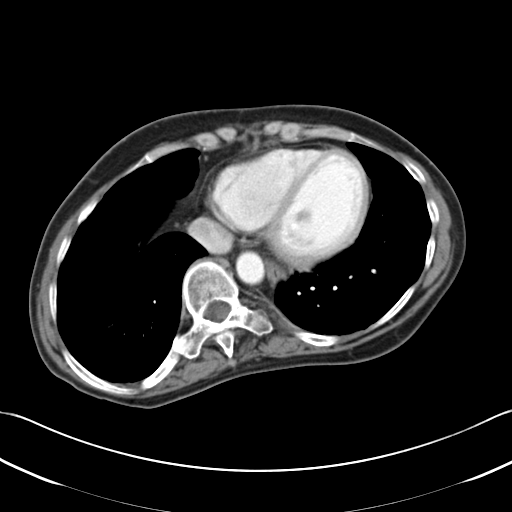

[Series 5: cor routine abd pel with · coronal · 0.62mm/px · 3 of 108 slices shown]
[im 36/108  soft-tissue]
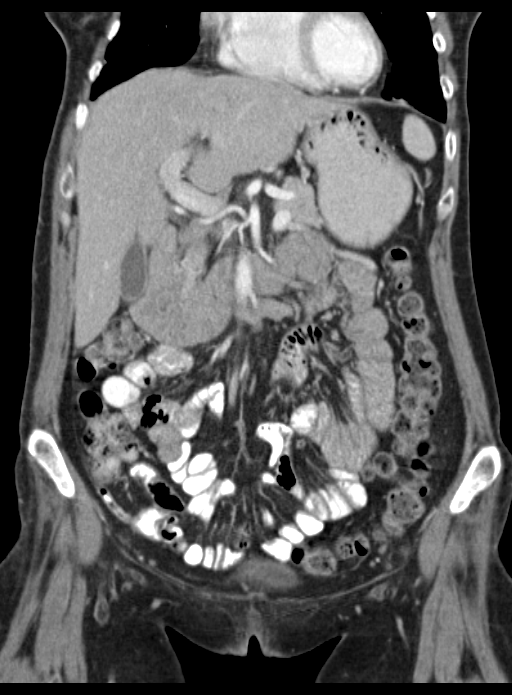
[im 48/108  soft-tissue]
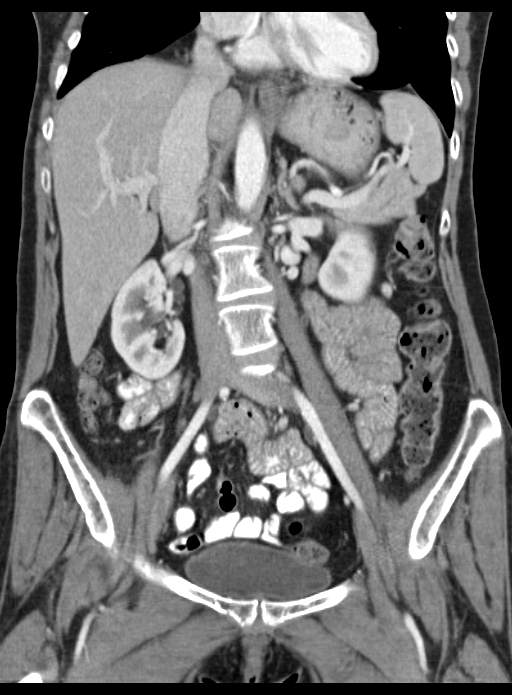
[im 60/108  soft-tissue]
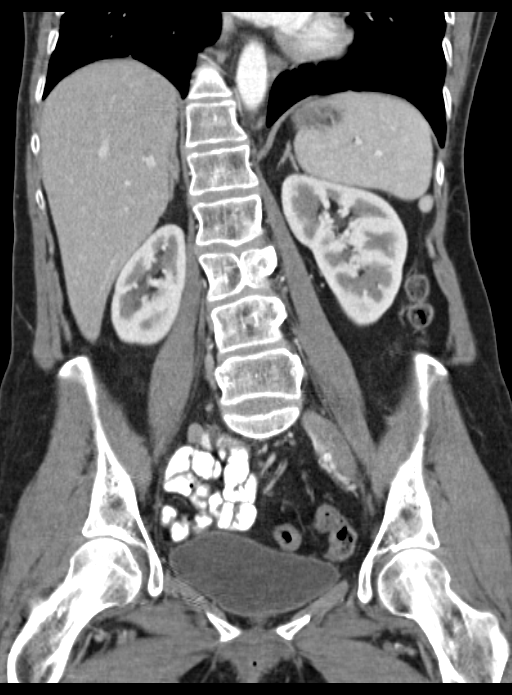

[15 of 46 positions shown; findings below may reference images not displayed]

FINDINGS: Lower chest: Clear lung bases. No significant pleural or pericardial
effusion.

Hepatobiliary: The liver is normal in density without focal
abnormality. No evidence of gallstones, gallbladder wall thickening
or biliary dilatation.

Pancreas: Unremarkable. No pancreatic ductal dilatation or
surrounding inflammatory changes.

Spleen: Normal in size without focal abnormality.

Adrenals/Urinary Tract: Both adrenal glands appear normal. There are
small low-density renal lesions bilaterally, too small to optimally
characterize, but likely cysts. No suspicious renal findings. No
evidence of urinary tract calculus or hydronephrosis. The bladder
appears unremarkable.

Stomach/Bowel: The stomach, small bowel, appendix and proximal colon
demonstrate no significant findings. There are diverticular changes
throughout the distal colon with sigmoid colon wall thickening and
surrounding inflammatory change. There is a peripherally enhancing
fluid collection posteriorly in the pelvis adjacent to the sigmoid
colon, measuring 2.3 cm on image 64 consistent with a small
peridiverticular abscess.

Vascular/Lymphatic: There are no enlarged abdominal or pelvic lymph
nodes. No significant vascular findings are present.

Reproductive: Status post hysterectomy. No evidence of adnexal mass.

Other: No evidence of abdominal wall mass or hernia.

Musculoskeletal: No acute or significant osseous findings.
Congenital semi butterfly L3 segment.
IMPRESSION: 1. Acute sigmoid diverticulitis with small peridiverticular abscess.
No evidence of bowel obstruction.
2. The appendix appears normal.
3. Previous hysterectomy.

## 2015-06-09 MED ORDER — IOHEXOL 240 MG/ML SOLN
50.0000 mL | INTRAMUSCULAR | Status: AC
  Administered 2015-06-09: 50 mL via ORAL
  Administered 2015-06-09: 25 mL via ORAL

## 2015-06-09 MED ORDER — HEPARIN SODIUM (PORCINE) 5000 UNIT/ML IJ SOLN
5000.0000 [IU] | Freq: Three times a day (TID) | INTRAMUSCULAR | Status: DC
  Administered 2015-06-09 – 2015-06-11 (×5): 5000 [IU] via SUBCUTANEOUS
  Filled 2015-06-09 (×5): qty 1

## 2015-06-09 MED ORDER — CIPROFLOXACIN IN D5W 400 MG/200ML IV SOLN
400.0000 mg | Freq: Once | INTRAVENOUS | Status: DC
  Filled 2015-06-09: qty 200

## 2015-06-09 MED ORDER — METRONIDAZOLE IN NACL 5-0.79 MG/ML-% IV SOLN
500.0000 mg | Freq: Three times a day (TID) | INTRAVENOUS | Status: DC
  Administered 2015-06-09 – 2015-06-11 (×5): 500 mg via INTRAVENOUS
  Filled 2015-06-09 (×8): qty 100

## 2015-06-09 MED ORDER — INFLUENZA VAC SPLIT QUAD 0.5 ML IM SUSY: 0.5000 mL | PREFILLED_SYRINGE | INTRAMUSCULAR | Status: DC

## 2015-06-09 MED ORDER — CIPROFLOXACIN IN D5W 400 MG/200ML IV SOLN
400.0000 mg | Freq: Two times a day (BID) | INTRAVENOUS | Status: DC
  Administered 2015-06-09 – 2015-06-11 (×4): 400 mg via INTRAVENOUS
  Filled 2015-06-09 (×5): qty 200

## 2015-06-09 MED ORDER — LACTATED RINGERS IV SOLN
INTRAVENOUS | Status: DC
  Administered 2015-06-09 – 2015-06-11 (×4): via INTRAVENOUS

## 2015-06-09 MED ORDER — IOHEXOL 300 MG/ML  SOLN
100.0000 mL | Freq: Once | INTRAMUSCULAR | Status: AC | PRN
  Administered 2015-06-09: 100 mL via INTRAVENOUS

## 2015-06-09 MED ORDER — ONDANSETRON HCL 4 MG/2ML IJ SOLN: 4.0000 mg | Freq: Four times a day (QID) | INTRAMUSCULAR | Status: DC | PRN

## 2015-06-09 MED ORDER — SODIUM CHLORIDE 0.9 % IV BOLUS (SEPSIS)
1000.0000 mL | Freq: Once | INTRAVENOUS | Status: AC
  Administered 2015-06-09: 1000 mL via INTRAVENOUS

## 2015-06-09 MED ORDER — METRONIDAZOLE IN NACL 5-0.79 MG/ML-% IV SOLN
500.0000 mg | Freq: Once | INTRAVENOUS | Status: DC
  Filled 2015-06-09: qty 100

## 2015-06-09 MED ORDER — HYDROCODONE-ACETAMINOPHEN 5-325 MG PO TABS
1.0000 | ORAL_TABLET | ORAL | Status: DC | PRN
  Administered 2015-06-09 – 2015-06-10 (×3): 1 via ORAL
  Filled 2015-06-09 (×3): qty 1

## 2015-06-09 MED ORDER — ONDANSETRON HCL 4 MG PO TABS
4.0000 mg | ORAL_TABLET | Freq: Four times a day (QID) | ORAL | Status: DC | PRN
  Administered 2015-06-10: 4 mg via ORAL
  Filled 2015-06-09: qty 1

## 2015-06-09 MED ORDER — MORPHINE SULFATE (PF) 2 MG/ML IV SOLN: 2.0000 mg | INTRAVENOUS | Status: DC | PRN

## 2015-06-09 MED ORDER — PNEUMOCOCCAL VAC POLYVALENT 25 MCG/0.5ML IJ INJ: 0.5000 mL | INJECTION | INTRAMUSCULAR | Status: DC

## 2015-06-09 NOTE — ED Notes (Addendum)
Patient comes in from [facility] practice complaining of right and lower abdominal pain that radiates toward vagina and back toward left flank. Patient with audible bowel sounds in all four quadrants.  Last BM 3 days ago.  Denies and frequency, burning or foul smell to urine.

## 2015-06-09 NOTE — Assessment & Plan Note (Addendum)
Sent to ER for the guarding; will request they evaluate as part of their work-up

## 2015-06-09 NOTE — ED Notes (Signed)
Attempted to call report again.  Receiving nurse unavailable in isolation room and no other nurse available.  Receiving nurse to call this nurse back.

## 2015-06-09 NOTE — H&P (Signed)
Kathryn Fuller is an 44 y.o. female.    Chief Complaint: Left lower quadrant abdominal pain  HPI: This a patient with 7 days of abdominal pain in the left lower quadrant she's never had this before she denies fevers or chills denies melena or hematochezia her last bowel movement was 2 days ago and was normal not much since then she's had no nausea or vomiting and no melena or hematochezia  Past Medical History  Diagnosis Date  . Pneumonia   . Endometriosis     Past Surgical History  Procedure Laterality Date  . Inguinal hernia repair    . Abdominal hysterectomy      TAH-BSO    Family History  Problem Relation Age of Onset  . Diabetes Mother   . Heart disease Father   . Heart disease Maternal Uncle   . Heart disease Maternal Grandmother    Social History:  reports that she has been smoking Cigarettes.  She has a 25 pack-year smoking history. She has never used smokeless tobacco. She reports that she does not drink alcohol or use illicit drugs.  Allergies: No Known Allergies   (Not in a hospital admission)   Review of Systems  Constitutional: Negative for fever and chills.  HENT: Negative.   Eyes: Negative.   Respiratory: Negative.  Negative for cough, hemoptysis and shortness of breath.   Cardiovascular: Negative.  Negative for chest pain and palpitations.  Gastrointestinal: Positive for abdominal pain and constipation. Negative for heartburn, nausea, vomiting, diarrhea, blood in stool and melena.  Genitourinary: Negative.   Musculoskeletal: Negative.   Skin: Negative.   Neurological: Negative.  Negative for weakness.  Endo/Heme/Allergies: Negative.   Psychiatric/Behavioral: Negative.      Physical Exam:  BP 113/89 mmHg  Pulse 65  Temp(Src) 98.4 F (36.9 C) (Oral)  Resp 18  Ht 5' 4"  (1.626 m)  Wt 120 lb (54.432 kg)  BMI 20.59 kg/m2  SpO2 98%  Physical Exam  Constitutional: She is oriented to person, place, and time and well-developed,  well-nourished, and in no distress. No distress.  HENT:  Head: Normocephalic and atraumatic.  Eyes: No scleral icterus.  Neck: Normal range of motion.  Cardiovascular: Normal rate, regular rhythm and normal heart sounds.   Pulmonary/Chest: Effort normal and breath sounds normal. No respiratory distress. She has no wheezes. She has no rales.  Abdominal: Soft. She exhibits no distension. There is tenderness. There is no rebound and no guarding.  Left lower quadrant tenderness with minimal percussion tenderness no guarding minimal if any rebound tenderness  Musculoskeletal: Normal range of motion. She exhibits no edema.  Lymphadenopathy:    She has no cervical adenopathy.  Neurological: She is alert and oriented to person, place, and time.  Skin: Skin is warm and dry. No erythema.  Tattoos  Psychiatric: Mood, affect and judgment normal.        Results for orders placed or performed during the hospital encounter of 06/09/15 (from the past 48 hour(s))  Urinalysis complete, with microscopic ([facility] only)     Status: Abnormal   Collection Time: 06/09/15 11:55 AM  Result Value Ref Range   Color, Urine COLORLESS (A) YELLOW   APPearance CLEAR (A) CLEAR   Glucose, UA NEGATIVE NEGATIVE mg/dL   Bilirubin Urine NEGATIVE NEGATIVE   Ketones, ur NEGATIVE NEGATIVE mg/dL   Specific Gravity, Urine 1.002 (L) 1.005 - 1.030   Hgb urine dipstick 2+ (A) NEGATIVE   pH 6.0 5.0 - 8.0   Protein, ur NEGATIVE NEGATIVE  mg/dL   Nitrite NEGATIVE NEGATIVE   Leukocytes, UA NEGATIVE NEGATIVE   RBC / HPF 0-5 0 - 5 RBC/hpf   WBC, UA 0-5 0 - 5 WBC/hpf   Bacteria, UA NONE SEEN NONE SEEN   Squamous Epithelial / LPF NONE SEEN NONE SEEN  Lipase, blood     Status: None   Collection Time: 06/09/15 11:55 AM  Result Value Ref Range   Lipase 38 22 - 51 U/L  Comprehensive metabolic panel     Status: Abnormal   Collection Time: 06/09/15 11:55 AM  Result Value Ref Range   Sodium 136 135 - 145 mmol/L   Potassium 3.7 3.5 -  5.1 mmol/L   Chloride 103 101 - 111 mmol/L   CO2 27 22 - 32 mmol/L   Glucose, Bld 114 (H) 65 - 99 mg/dL   BUN 9 6 - 20 mg/dL   Creatinine, Ser 0.65 0.44 - 1.00 mg/dL   Calcium 9.1 8.9 - 10.3 mg/dL   Total Protein 7.1 6.5 - 8.1 g/dL   Albumin 4.0 3.5 - 5.0 g/dL   AST 17 15 - 41 U/L   ALT 11 (L) 14 - 54 U/L   Alkaline Phosphatase 78 38 - 126 U/L   Total Bilirubin 0.6 0.3 - 1.2 mg/dL   GFR calc non Af Amer >60 >60 mL/min   GFR calc Af Amer >60 >60 mL/min    Comment: (NOTE) The eGFR has been calculated using the CKD EPI equation. This calculation has not been validated in all clinical situations. eGFR's persistently <60 mL/min signify possible Chronic Kidney Disease.    Anion gap 6 5 - 15  CBC WITH DIFFERENTIAL     Status: None   Collection Time: 06/09/15 11:55 AM  Result Value Ref Range   WBC 9.2 3.6 - 11.0 K/uL   RBC 4.52 3.80 - 5.20 MIL/uL   Hemoglobin 14.2 12.0 - 16.0 g/dL   HCT 41.6 35.0 - 47.0 %   MCV 92.2 80.0 - 100.0 fL   MCH 31.4 26.0 - 34.0 pg   MCHC 34.1 32.0 - 36.0 g/dL   RDW 12.9 11.5 - 14.5 %   Platelets 286 150 - 440 K/uL   Neutrophils Relative % 68 %   Neutro Abs 6.3 1.4 - 6.5 K/uL   Lymphocytes Relative 23 %   Lymphs Abs 2.1 1.0 - 3.6 K/uL   Monocytes Relative 7 %   Monocytes Absolute 0.7 0.2 - 0.9 K/uL   Eosinophils Relative 1 %   Eosinophils Absolute 0.1 0 - 0.7 K/uL   Basophils Relative 1 %   Basophils Absolute 0.0 0 - 0.1 K/uL   Ct Abdomen Pelvis W Contrast  06/09/2015   CLINICAL DATA:  Right lower abdominal pain radiating to the vagina and left flank. Last bowel movement 4 days ago. Initial encounter.  EXAM: CT ABDOMEN AND PELVIS WITH CONTRAST  TECHNIQUE: Multidetector CT imaging of the abdomen and pelvis was performed using the standard protocol following bolus administration of intravenous contrast.  CONTRAST:  170m OMNIPAQUE IOHEXOL 300 MG/ML  SOLN  COMPARISON:  CT urogram 06/06/2012  FINDINGS: Lower chest: Clear lung bases. No significant pleural  or pericardial effusion.  Hepatobiliary: The liver is normal in density without focal abnormality. No evidence of gallstones, gallbladder wall thickening or biliary dilatation.  Pancreas: Unremarkable. No pancreatic ductal dilatation or surrounding inflammatory changes.  Spleen: Normal in size without focal abnormality.  Adrenals/Urinary Tract: Both adrenal glands appear normal. There are small low-density renal lesions bilaterally,  too small to optimally characterize, but likely cysts. No suspicious renal findings. No evidence of urinary tract calculus or hydronephrosis. The bladder appears unremarkable.  Stomach/Bowel: The stomach, small bowel, appendix and proximal colon demonstrate no significant findings. There are diverticular changes throughout the distal colon with sigmoid colon wall thickening and surrounding inflammatory change. There is a peripherally enhancing fluid collection posteriorly in the pelvis adjacent to the sigmoid colon, measuring 2.3 cm on image 64 consistent with a small peridiverticular abscess.  Vascular/Lymphatic: There are no enlarged abdominal or pelvic lymph nodes. No significant vascular findings are present.  Reproductive: Status post hysterectomy. No evidence of adnexal mass.  Other: No evidence of abdominal wall mass or hernia.  Musculoskeletal: No acute or significant osseous findings. Congenital semi butterfly L3 segment.  IMPRESSION: 1. Acute sigmoid diverticulitis with small peridiverticular abscess. No evidence of bowel obstruction. 2. The appendix appears normal. 3. Previous hysterectomy.   Electronically Signed   By: Richardean Sale M.D.   On: 06/09/2015 16:53     Assessment/Plan  CT scan personally reviewed showing microperforation with very small fluid collection suggestive of an abscess from perforated diverticulitis  Admission to the hospital with IV anabiotic's discussed with her the options and the risks of a potential exploratory laparotomy with likely  colostomy if she were to worsen she understood and agreed with this plan. Discussed with ER visit physician  Florene Glen, MD, FACS

## 2015-06-09 NOTE — Progress Notes (Signed)
BP 106/71 mmHg  Pulse 72  Temp(Src) 97.4 F (36.3 C)  Wt 120 lb (54.432 kg)  SpO2 98%   Subjective:    Patient ID: Kathryn Fuller, female    DOB: [birth year], 44 y.o.   MRN: [id number]  HPI: Kathryn Fuller is a 44 y.o. female  Chief Complaint  Patient presents with  . Abdominal Pain    for almost a week. raidating pain. no fever. no N/V/D.   She is here for an acute visit; hurts in the middle of the stomach, then sharp pains will go down to the vagina; radiates to the back; sharp and feels like someone is stabbing her More intense yesterday; can go for a few hours of pain, subsides for a little Going on for a week now See ROS; also having vaginal discharge  Relevant past medical, surgical, family and social history reviewed and updated as indicated. Interim medical history since our last visit reviewed. Allergies and medications reviewed and updated.  No personal hx of kidney stones; s/p full hysterectomy and BSO; still has appendix No family hx of kidney stones  Review of Systems  Constitutional: Negative for fever, chills and appetite change.  Gastrointestinal: Positive for abdominal pain. Negative for nausea, vomiting, diarrhea, constipation, blood in stool and abdominal distention.  Genitourinary: Positive for dysuria, urgency, frequency, flank pain (left worse than right, can go all the way around) and vaginal discharge (little discharge when she wipes, sometimes yellow or dark; last sexual activity a few weeks, no new partner). Negative for hematuria, enuresis (not with this issue, but happened a couple of months ago) and difficulty urinating.  Per HPI unless specifically indicated above     Objective:    BP 106/71 mmHg  Pulse 72  Temp(Src) 97.4 F (36.3 C)  Wt 120 lb (54.432 kg)  SpO2 98%  Wt Readings from Last 3 Encounters:  06/09/15 120 lb (54.432 kg)  03/28/15 120 lb (54.432 kg)  03/25/15 119 lb (53.978 kg)    Physical Exam  Constitutional: She  appears well-developed and well-nourished. No distress.  Eyes: No scleral icterus.  Cardiovascular: Normal rate and regular rhythm.   Pulmonary/Chest: Effort normal and breath sounds normal.  Abdominal: Soft. Bowel sounds are normal. She exhibits distension. There is tenderness in the right upper quadrant and left lower quadrant. There is rebound, guarding and CVA tenderness. There is no rigidity.  Musculoskeletal:       Thoracic back: She exhibits deformity (marked kyphoscoliosis).  Skin: Skin is warm and dry.  Abdominal striae; no vesicular rash  Psychiatric: She has a normal mood and affect. Her behavior is normal. Judgment and thought content normal.  cooperative   Results for orders placed or performed in visit on 03/28/15  Von Willebrand panel  Result Value Ref Range   Factor VIII Activity 80 50 - 150 %   Von Willebrand Ag 71 50 - 150 %   Von Willebrand Factor 72 50 - 150 %  Coag Studies Interp Report  Result Value Ref Range   Interpretation Note    PDF Image .       Assessment & Plan:   Problem List Items Addressed This Visit      Other   Abdominal pain, lower - Primary    ddx includes urinary tract infection, pyelonephritis, peritonitis, diverticulitis other infection; she has guarding and rebound (significant pain, severe and could be sign of serious infection), so she was sent straight to the emergency department for evaluation and  work-up and treatment      Relevant Orders   UA/M w/rflx Culture, Routine   Vaginal discharge    Sent to ER for the guarding; will request they evaluate as part of their work-up         Follow up plan: Return if symptoms worsen or fail to improve.  I tried three times to give check-out to someone in the ER and was unable; tried [phone], tried [phone] x 2; I will ask one of my staff to try again

## 2015-06-09 NOTE — Assessment & Plan Note (Addendum)
ddx includes urinary tract infection, pyelonephritis, peritonitis, diverticulitis other infection; she has guarding and rebound (significant pain, severe and could be sign of serious infection), so she was sent straight to the emergency department for evaluation and work-up and treatment

## 2015-06-09 NOTE — ED Notes (Signed)
Sent in from md's office for abd pain and tenderness

## 2015-06-09 NOTE — Patient Instructions (Signed)
Go from here to the ER

## 2015-06-09 NOTE — ED Provider Notes (Signed)
[facility] Emergency Department Provider Note  ____________________________________________  Time seen: Approximately 1:33 PM  I have reviewed the triage vital signs and the nursing notes.   HISTORY  Chief Complaint Abdominal Pain    HPI Kathryn Fuller is a 44 y.o. female with history of scoliosis, status post which she describes as a total hysterectomy 12 years ago ("they took everything out, even the ovaries) who presents for one week of worsening diffuse lower abdominal pain, worse in the right lower quadrant and left lower quadrant, constant, aching but sometimes sharp. No nausea, vomiting, diarrhea, fevers or chills. No pain or burning with urination or blood in her urine. No abnormal vaginal bleeding or vaginal discharge. No chest pain or difficulty breathing. Seen by her primary care doctor today with tenderness on exam and referred to the emergency department. No modifying factors. Currently her symptoms are mild to moderate.   Past Medical History  Diagnosis Date  . Pneumonia   . Endometriosis     Patient Active Problem List   Diagnosis Date Noted  . Abdominal pain, lower 06/09/2015  . Vaginal discharge 06/09/2015  . Dyslipidemia 06/05/2015  . Tobacco abuse 03/27/2015    Past Surgical History  Procedure Laterality Date  . Inguinal hernia repair    . Abdominal hysterectomy      TAH-BSO    Current Outpatient Rx  Name  Route  Sig  Dispense  Refill  . ibuprofen (ADVIL,MOTRIN) 200 MG tablet   Oral   Take 800 mg by mouth every 8 (eight) hours as needed. For pain           Allergies Review of patient's allergies indicates no known allergies.  Family History  Problem Relation Age of Onset  . Diabetes Mother   . Heart disease Father   . Heart disease Maternal Uncle   . Heart disease Maternal Grandmother     Social History Social History  Substance Use Topics  . Smoking status: Current Every Day Smoker -- 1.00 packs/day for 25  years    Types: Cigarettes  . Smokeless tobacco: Never Used  . Alcohol Use: No    Review of Systems Constitutional: No fever/chills Eyes: No visual changes. ENT: No sore throat. Cardiovascular: Denies chest pain. Respiratory: Denies shortness of breath. Gastrointestinal: + abdominal pain.  No nausea, no vomiting.  No diarrhea.  No constipation. Genitourinary: Negative for dysuria. Musculoskeletal: Negative for back pain. Skin: Negative for rash. Neurological: Negative for headaches, focal weakness or numbness.  10-point ROS otherwise negative.  ____________________________________________   PHYSICAL EXAM:  VITAL SIGNS: ED Triage Vitals  Enc Vitals Group     BP 06/09/15 1150 114/81 mmHg     Pulse Rate 06/09/15 1150 59     Resp 06/09/15 1150 18     Temp 06/09/15 1150 98.4 F (36.9 C)     Temp Source 06/09/15 1150 Oral     SpO2 06/09/15 1150 96 %     Weight 06/09/15 1150 120 lb (54.432 kg)     Height 06/09/15 1150 5\' 4"  (1.626 m)     Head Cir --      Peak Flow --      Pain Score 06/09/15 1150 6     Pain Loc --      Pain Edu? --      Excl. in [place]? --     Constitutional: Alert and oriented. Well appearing and in no acute distress. Eyes: Conjunctivae are normal. PERRL. EOMI. Head: Atraumatic. Nose: No  congestion/rhinnorhea. Mouth/Throat: Mucous membranes are moist.  Oropharynx non-erythematous. Neck: No stridor.   Cardiovascular: Normal rate, regular rhythm. Grossly normal heart sounds.  Good peripheral circulation. Respiratory: Normal respiratory effort.  No retractions. Lungs CTAB. Gastrointestinal: Soft with moderate tenderness throughout the lower abdomen and mild distension. No CVA tenderness. Genitourinary: deferred Musculoskeletal: No lower extremity tenderness nor edema.  No joint effusions. Neurologic:  Normal speech and language. No gross focal neurologic deficits are appreciated. No gait instability. Skin:  Skin is warm, dry and intact. No rash  noted. Psychiatric: Mood and affect are normal. Speech and behavior are normal.  ____________________________________________   LABS (all labs ordered are listed, but only abnormal results are displayed)  Labs Reviewed  URINALYSIS COMPLETEWITH MICROSCOPIC ([place]) - Abnormal; Notable for the following:    Color, Urine COLORLESS (*)    APPearance CLEAR (*)    Specific Gravity, Urine 1.002 (*)    Hgb urine dipstick 2+ (*)    All other components within normal limits  COMPREHENSIVE METABOLIC PANEL - Abnormal; Notable for the following:    Glucose, Bld 114 (*)    ALT 11 (*)    All other components within normal limits  CULTURE, BLOOD (ROUTINE X 2)  CULTURE, BLOOD (ROUTINE X 2)  LIPASE, BLOOD  CBC WITH DIFFERENTIAL/PLATELET   ____________________________________________  EKG  none ____________________________________________  RADIOLOGY  CT abdomen and pelvis IMPRESSION: 1. Acute sigmoid diverticulitis with small peridiverticular abscess. No evidence of bowel obstruction. 2. The appendix appears normal. 3. Previous hysterectomy. ____________________________________________   PROCEDURES  Procedure(s) performed: None  Critical Care performed: No  ____________________________________________   INITIAL IMPRESSION / ASSESSMENT AND PLAN / ED COURSE  Pertinent labs & imaging results that were available during my care of the patient were reviewed by me and considered in my medical decision making (see chart for details).  Kathryn Fuller is a 44 y.o. female with history of scoliosis, status post which she describes as a total hysterectomy 12 years ago ("they took everything out, even the ovaries) who presents for one week of worsening diffuse lower abdominal pain. On exam, she is generally well-appearing and in no acute distress. Vital signs stable, she is afebrile. She does have moderate tenderness to palpation throughout the lower abdomen is mild distention. Labs  reviewed. Normal CBC, CMP and lipase. Urinalysis is not consistent with urination tract infection, elevation of red blood cell count in her urine. Given her marked tenderness to palpation throughout the lower abdomen, multiple abdominal operations,we'll obtain CT of the abdomen and pelvis to further evaluate cause of her abdominal pain.  ----------------------------------------- 5:11 PM on 06/09/2015 -----------------------------------------  Abdomen and pelvis showed acute diverticulitis with small peridiverticular abscess. I discussed the case with Dr. Burt Knack of Gen. surgery who will evaluate and admit. We'll give IV Cipro, Flagyl, fluids.  ____________________________________________   FINAL CLINICAL IMPRESSION(S) / ED DIAGNOSES  Final diagnoses:  Lower abdominal pain  Diverticulitis of large intestine with abscess without bleeding      Joanne Gavel, MD 06/09/15 2201

## 2015-06-09 NOTE — ED Notes (Signed)
Dr cooper to bedside

## 2015-06-09 NOTE — ED Notes (Signed)
Attempted to call report.  Receiving nurse still in report and unable to take call.  Other nurse not available to take report.  This nurse to call again in about 5 minutes.

## 2015-06-10 LAB — CBC
HCT: 37.4 % (ref 35.0–47.0)
Hemoglobin: 12.8 g/dL (ref 12.0–16.0)
MCH: 31.6 pg (ref 26.0–34.0)
MCHC: 34.1 g/dL (ref 32.0–36.0)
MCV: 92.7 fL (ref 80.0–100.0)
PLATELETS: 253 10*3/uL (ref 150–440)
RBC: 4.04 MIL/uL (ref 3.80–5.20)
RDW: 13 % (ref 11.5–14.5)
WBC: 6.3 10*3/uL (ref 3.6–11.0)

## 2015-06-10 NOTE — Progress Notes (Signed)
   06/10/15 0930  Clinical Encounter Type  Visited With Patient  Visit Type Initial  Referral From Nurse  Consult/Referral To Chaplain  Spiritual Encounters  Spiritual Needs Literature  Advance Directives (For Healthcare)  Does patient have an advance directive? No  Would patient like information on creating an advanced directive? Yes - Scientist, clinical (histocompatibility and immunogenetics) given  Provided pastoral presence, support and education/forms on HCPOA/Living Will.  Patient may complete while here.  Will contact us if or when she decides to complete.  [place] [phone]

## 2015-06-10 NOTE — Plan of Care (Signed)
Problem: Discharge Progression Outcomes Goal: Other Discharge Outcomes/Goals Outcome: Progressing VSS. Pt reported Lower abd pain, but declined medication. Zofran given once for nausea with good effect. Tolerates clear liquid diet. 1xloose stool in am. Continue IVF and ABX.

## 2015-06-10 NOTE — Plan of Care (Signed)
Problem: Discharge Progression Outcomes Goal: Other Discharge Outcomes/Goals Outcome: Progressing Plan of care progress to goal: 1. Pain: Norco one tab given once for pain with improvement of her abdominal pain. 2. Hemodynamics: IVF infusing. LR at 125 mls/hr. Some loose stools initially. May have come for CT contrast. 3. Complications resolved: Pt was admitted by surgeon  (Dr Burt Knack) no plans for surgery at this time. Medical dx of diverticulitis with diverticular abscess.  4. Diet: Pt c/o mild nausea. No emesis noted. Taking only sips. 5. Activity: Up ad lib with standby assist.

## 2015-06-10 NOTE — Care Management (Signed)
Admitted to this facility with the diagnosis of diverticulitis. Lives with husband, Marzetta Board, [phone]). Seen Dr. Rosine Door yesterday at [facility] and was sent to the hospital. No home health. Takes care of both basic and instrumental activities of daily living herself. Still drives. Works as a Freight forwarder at [employer]. No falls. Good appetite. Husband will transport. Surgical consult per Dr. Burt Knack. Liquid diet today, if tolerates, advanced tomorrow, Shelbie Ammons RN MSN Care Management [phone]

## 2015-06-10 NOTE — Progress Notes (Signed)
CC: llq abd pain Subjective: Patient states that her pain is improved she has had some loose stools now denies fevers or chills and overall feels better no nausea or vomiting.  Objective: Vital signs in last 24 hours: Temp:  [97.4 F (36.3 C)-98.4 F (36.9 C)] 97.5 F (36.4 C) (09/30 0518) Pulse Rate:  [54-72] 54 (09/30 0518) Resp:  [16-18] 18 (09/30 0518) BP: (93-114)/(58-89) 108/58 mmHg (09/30 0518) SpO2:  [95 %-100 %] 96 % (09/30 0518) Weight:  [120 lb (54.432 kg)-123 lb 11.2 oz (56.11 kg)] 123 lb 11.2 oz (56.11 kg) (09/29 2042) Last BM Date: 06/09/15  Intake/Output from previous day:   Intake/Output this shift:    Physical exam:  Soft abdomen slightly distended minimally tender in the left lower quadrant without peritoneal signs Nontender calves no scleral icterus   Lab Results: CBC   Recent Labs  06/09/15 1155 06/10/15 0503  WBC 9.2 6.3  HGB 14.2 12.8  HCT 41.6 37.4  PLT 286 253   BMET  Recent Labs  06/09/15 1155  NA 136  K 3.7  CL 103  CO2 27  GLUCOSE 114*  BUN 9  CREATININE 0.65  CALCIUM 9.1   PT/INR No results for input(s): LABPROT, INR in the last 72 hours. ABG No results for input(s): PHART, HCO3 in the last 72 hours.  Invalid input(s): PCO2, PO2  Studies/Results: Ct Abdomen Pelvis W Contrast  06/09/2015   CLINICAL DATA:  Right lower abdominal pain radiating to the vagina and left flank. Last bowel movement 4 days ago. Initial encounter.  EXAM: CT ABDOMEN AND PELVIS WITH CONTRAST  TECHNIQUE: Multidetector CT imaging of the abdomen and pelvis was performed using the standard protocol following bolus administration of intravenous contrast.  CONTRAST:  127mL OMNIPAQUE IOHEXOL 300 MG/ML  SOLN  COMPARISON:  CT urogram 06/06/2012  FINDINGS: Lower chest: Clear lung bases. No significant pleural or pericardial effusion.  Hepatobiliary: The liver is normal in density without focal abnormality. No evidence of gallstones, gallbladder wall thickening or  biliary dilatation.  Pancreas: Unremarkable. No pancreatic ductal dilatation or surrounding inflammatory changes.  Spleen: Normal in size without focal abnormality.  Adrenals/Urinary Tract: Both adrenal glands appear normal. There are small low-density renal lesions bilaterally, too small to optimally characterize, but likely cysts. No suspicious renal findings. No evidence of urinary tract calculus or hydronephrosis. The bladder appears unremarkable.  Stomach/Bowel: The stomach, small bowel, appendix and proximal colon demonstrate no significant findings. There are diverticular changes throughout the distal colon with sigmoid colon wall thickening and surrounding inflammatory change. There is a peripherally enhancing fluid collection posteriorly in the pelvis adjacent to the sigmoid colon, measuring 2.3 cm on image 64 consistent with a small peridiverticular abscess.  Vascular/Lymphatic: There are no enlarged abdominal or pelvic lymph nodes. No significant vascular findings are present.  Reproductive: Status post hysterectomy. No evidence of adnexal mass.  Other: No evidence of abdominal wall mass or hernia.  Musculoskeletal: No acute or significant osseous findings. Congenital semi butterfly L3 segment.  IMPRESSION: 1. Acute sigmoid diverticulitis with small peridiverticular abscess. No evidence of bowel obstruction. 2. The appendix appears normal. 3. Previous hysterectomy.   Electronically Signed   By: Richardean Sale M.D.   On: 06/09/2015 16:53    Anti-infectives: Anti-infectives    Start     Dose/Rate Route Frequency Ordered Stop   06/09/15 1800  ciprofloxacin (CIPRO) IVPB 400 mg     400 mg 200 mL/hr over 60 Minutes Intravenous Every 12 hours 06/09/15 1745  06/09/15 1800  metroNIDAZOLE (FLAGYL) IVPB 500 mg     500 mg 100 mL/hr over 60 Minutes Intravenous Every 8 hours 06/09/15 1745     06/09/15 1715  metroNIDAZOLE (FLAGYL) IVPB 500 mg  Status:  Discontinued     500 mg 100 mL/hr over 60 Minutes  Intravenous  Once 06/09/15 1702 06/09/15 1831   06/09/15 1715  ciprofloxacin (CIPRO) IVPB 400 mg  Status:  Discontinued     400 mg 200 mL/hr over 60 Minutes Intravenous  Once 06/09/15 1702 06/09/15 1831      Assessment/Plan:  White blood cell count improved abdominal exam is improved as well. Emend continuing IV antibiotic's for presumed diverticulitis and switch to oral antibiotic's possibly as early as tomorrow with discharge tomorrow continue relative bowel rest with clear liquids.  Florene Glen, MD, FACS  06/10/2015

## 2015-06-11 DIAGNOSIS — K5720 Diverticulitis of large intestine with perforation and abscess without bleeding: Secondary | ICD-10-CM | POA: Insufficient documentation

## 2015-06-11 DIAGNOSIS — K5730 Diverticulosis of large intestine without perforation or abscess without bleeding: Secondary | ICD-10-CM

## 2015-06-11 DIAGNOSIS — R1030 Lower abdominal pain, unspecified: Secondary | ICD-10-CM | POA: Insufficient documentation

## 2015-06-11 HISTORY — DX: Diverticulosis of large intestine without perforation or abscess without bleeding: K57.30

## 2015-06-11 LAB — CBC WITH DIFFERENTIAL/PLATELET
Basophils Absolute: 0.1 10*3/uL (ref 0–0.1)
Basophils Relative: 1 %
EOS ABS: 0.1 10*3/uL (ref 0–0.7)
EOS PCT: 2 %
HCT: 37.2 % (ref 35.0–47.0)
Hemoglobin: 12.9 g/dL (ref 12.0–16.0)
LYMPHS ABS: 1.7 10*3/uL (ref 1.0–3.6)
Lymphocytes Relative: 33 %
MCH: 32 pg (ref 26.0–34.0)
MCHC: 34.8 g/dL (ref 32.0–36.0)
MCV: 92.1 fL (ref 80.0–100.0)
Monocytes Absolute: 0.4 10*3/uL (ref 0.2–0.9)
Monocytes Relative: 9 %
Neutro Abs: 2.9 10*3/uL (ref 1.4–6.5)
Neutrophils Relative %: 55 %
PLATELETS: 244 10*3/uL (ref 150–440)
RBC: 4.04 MIL/uL (ref 3.80–5.20)
RDW: 12.9 % (ref 11.5–14.5)
WBC: 5.2 10*3/uL (ref 3.6–11.0)

## 2015-06-11 MED ORDER — CIPROFLOXACIN HCL 500 MG PO TABS: 500.0000 mg | ORAL_TABLET | Freq: Two times a day (BID) | ORAL | Status: DC

## 2015-06-11 MED ORDER — METRONIDAZOLE 500 MG PO TABS: 500.0000 mg | ORAL_TABLET | Freq: Three times a day (TID) | ORAL | Status: DC

## 2015-06-11 MED ORDER — HYDROCODONE-ACETAMINOPHEN 5-325 MG PO TABS: 1.0000 | ORAL_TABLET | ORAL | Status: DC | PRN

## 2015-06-11 NOTE — Plan of Care (Signed)
Problem: Discharge Progression Outcomes Goal: Other Discharge Outcomes/Goals Plan of care progress to goal: Pain: given norco for headache with improvement Hemodynamically: VSS Diet: tolerating clear liquids

## 2015-06-11 NOTE — Progress Notes (Signed)
CC: Left lower quadrant pain Subjective: Patient's feeling better with her left lower quadrant pain almost resolved. She has no nausea vomiting and is tolerating clear liquid diet.  No fevers or chills no melena or hematochezia  Objective: Vital signs in last 24 hours: Temp:  [97.6 F (36.4 C)-98 F (36.7 C)] 97.7 F (36.5 C) (10/01 0511) Pulse Rate:  [44-52] 46 (10/01 0511) Resp:  [16-22] 22 (10/01 0511) BP: (89-107)/(53-61) 107/61 mmHg (10/01 0511) SpO2:  [96 %-98 %] 96 % (10/01 0511) Last BM Date: 06/10/15  Intake/Output from previous day: 09/30 0701 - 10/01 0700 In: 1000 [P.O.:1000] Out: -  Intake/Output this shift:    Physical exam:  Awake alert and oriented no acute distress Abdomen is soft nondistended minimally tender if any in the left lower quadrant. No peritoneal signs. Calves are nontender   Lab Results: CBC   Recent Labs  06/10/15 0503 06/11/15 0452  WBC 6.3 5.2  HGB 12.8 12.9  HCT 37.4 37.2  PLT 253 244   BMET  Recent Labs  06/09/15 1155  NA 136  K 3.7  CL 103  CO2 27  GLUCOSE 114*  BUN 9  CREATININE 0.65  CALCIUM 9.1   PT/INR No results for input(s): LABPROT, INR in the last 72 hours. ABG No results for input(s): PHART, HCO3 in the last 72 hours.  Invalid input(s): PCO2, PO2  Studies/Results: Ct Abdomen Pelvis W Contrast  06/09/2015   CLINICAL DATA:  Right lower abdominal pain radiating to the vagina and left flank. Last bowel movement 4 days ago. Initial encounter.  EXAM: CT ABDOMEN AND PELVIS WITH CONTRAST  TECHNIQUE: Multidetector CT imaging of the abdomen and pelvis was performed using the standard protocol following bolus administration of intravenous contrast.  CONTRAST:  181mL OMNIPAQUE IOHEXOL 300 MG/ML  SOLN  COMPARISON:  CT urogram 06/06/2012  FINDINGS: Lower chest: Clear lung bases. No significant pleural or pericardial effusion.  Hepatobiliary: The liver is normal in density without focal abnormality. No evidence of  gallstones, gallbladder wall thickening or biliary dilatation.  Pancreas: Unremarkable. No pancreatic ductal dilatation or surrounding inflammatory changes.  Spleen: Normal in size without focal abnormality.  Adrenals/Urinary Tract: Both adrenal glands appear normal. There are small low-density renal lesions bilaterally, too small to optimally characterize, but likely cysts. No suspicious renal findings. No evidence of urinary tract calculus or hydronephrosis. The bladder appears unremarkable.  Stomach/Bowel: The stomach, small bowel, appendix and proximal colon demonstrate no significant findings. There are diverticular changes throughout the distal colon with sigmoid colon wall thickening and surrounding inflammatory change. There is a peripherally enhancing fluid collection posteriorly in the pelvis adjacent to the sigmoid colon, measuring 2.3 cm on image 64 consistent with a small peridiverticular abscess.  Vascular/Lymphatic: There are no enlarged abdominal or pelvic lymph nodes. No significant vascular findings are present.  Reproductive: Status post hysterectomy. No evidence of adnexal mass.  Other: No evidence of abdominal wall mass or hernia.  Musculoskeletal: No acute or significant osseous findings. Congenital semi butterfly L3 segment.  IMPRESSION: 1. Acute sigmoid diverticulitis with small peridiverticular abscess. No evidence of bowel obstruction. 2. The appendix appears normal. 3. Previous hysterectomy.   Electronically Signed   By: Richardean Sale M.D.   On: 06/09/2015 16:53    Anti-infectives: Anti-infectives    Start     Dose/Rate Route Frequency Ordered Stop   06/11/15 0000  ciprofloxacin (CIPRO) 500 MG tablet     500 mg Oral 2 times daily 06/11/15 0810  06/11/15 0000  metroNIDAZOLE (FLAGYL) 500 MG tablet     500 mg Oral 3 times daily 06/11/15 0810     06/09/15 1800  ciprofloxacin (CIPRO) IVPB 400 mg     400 mg 200 mL/hr over 60 Minutes Intravenous Every 12 hours 06/09/15 1745      06/09/15 1800  metroNIDAZOLE (FLAGYL) IVPB 500 mg     500 mg 100 mL/hr over 60 Minutes Intravenous Every 8 hours 06/09/15 1745     06/09/15 1715  metroNIDAZOLE (FLAGYL) IVPB 500 mg  Status:  Discontinued     500 mg 100 mL/hr over 60 Minutes Intravenous  Once 06/09/15 1702 06/09/15 1831   06/09/15 1715  ciprofloxacin (CIPRO) IVPB 400 mg  Status:  Discontinued     400 mg 200 mL/hr over 60 Minutes Intravenous  Once 06/09/15 1702 06/09/15 1831      Assessment/Plan:  Patient doing very well diverticulitis is controlled will recommend discharging on oral antibiotic's to follow-up in 10 days.  Florene Glen, MD, FACS  06/11/2015

## 2015-06-11 NOTE — Progress Notes (Signed)
MD order received to discharge pt home today; verbally reviewed AVS with pt including medications/gave Rx for Norco to pt; no further questions voiced at this time; pt's discharge pending arrival of her family for a ride home

## 2015-06-11 NOTE — Progress Notes (Signed)
Pt's ride present for discharge home; pt discharged via wheelchair by nursing to the visitor's entrance

## 2015-06-11 NOTE — Discharge Summary (Signed)
Physician Discharge Summary  Patient ID: DHAMAR GREGORY MRN: [id number] DOB/AGE: 45-15-1972 45 y.o.  Admit date: 06/09/2015 Discharge date: 06/11/2015   Discharge Diagnoses:  Active Problems:   Diverticulitis large intestine   Procedures: None  Hospital Course: This patient was admitted with a presumptive diagnosis of acute diverticulitis with microperforation. She was treated with IV anabiotic's and her pain promptly improved and she is discharged in stable condition advancing to a full liquid diet to follow-up in my office in 10 days she is given oral analgesia 6 and oral antibiotic's and will continue her home medications.  Consults: None  Disposition: 01-Home or Self Care     Medication List    TAKE these medications        ciprofloxacin 500 MG tablet  Commonly known as:  CIPRO  Take 1 tablet (500 mg total) by mouth 2 (two) times daily.     HYDROcodone-acetaminophen 5-325 MG tablet  Commonly known as:  NORCO/VICODIN  Take 1-2 tablets by mouth every 4 (four) hours as needed for moderate pain.     metroNIDAZOLE 500 MG tablet  Commonly known as:  FLAGYL  Take 1 tablet (500 mg total) by mouth 3 (three) times daily.         Florene Glen, MD, FACS

## 2015-06-13 ENCOUNTER — Ambulatory Visit: Payer: 59 | Admitting: Family Medicine

## 2015-06-15 LAB — CULTURE, BLOOD (ROUTINE X 2)
Culture: NO GROWTH
Culture: NO GROWTH

## 2015-06-16 ENCOUNTER — Telehealth: Payer: Self-pay

## 2015-06-16 ENCOUNTER — Other Ambulatory Visit
Admission: RE | Admit: 2015-06-16 | Discharge: 2015-06-16 | Disposition: A | Discharge status: Home / Self Care | Payer: 59 | Source: Ambulatory Visit | Attending: Surgery | Admitting: Surgery

## 2015-06-16 ENCOUNTER — Encounter: Payer: Self-pay | Admitting: Surgery

## 2015-06-16 ENCOUNTER — Ambulatory Visit (INDEPENDENT_AMBULATORY_CARE_PROVIDER_SITE_OTHER): Payer: 59 | Admitting: Surgery

## 2015-06-16 ENCOUNTER — Ambulatory Visit
Admission: RE | Admit: 2015-06-16 | Discharge: 2015-06-16 | Disposition: A | Discharge status: Home / Self Care | Payer: 59 | Source: Ambulatory Visit | Attending: Surgery | Admitting: Surgery

## 2015-06-16 VITALS — BP 108/72 | HR 66 | Temp 98.1°F | Ht 64.0 in | Wt 118.0 lb

## 2015-06-16 DIAGNOSIS — K5792 Diverticulitis of intestine, part unspecified, without perforation or abscess without bleeding: Secondary | ICD-10-CM | POA: Insufficient documentation

## 2015-06-16 DIAGNOSIS — K5730 Diverticulosis of large intestine without perforation or abscess without bleeding: Secondary | ICD-10-CM

## 2015-06-16 DIAGNOSIS — R1032 Left lower quadrant pain: Secondary | ICD-10-CM

## 2015-06-16 DIAGNOSIS — K5720 Diverticulitis of large intestine with perforation and abscess without bleeding: Secondary | ICD-10-CM | POA: Insufficient documentation

## 2015-06-16 LAB — CBC WITH DIFFERENTIAL/PLATELET
Basophils Absolute: 0.1 10*3/uL (ref 0–0.1)
Basophils Relative: 1 %
EOS PCT: 1 %
Eosinophils Absolute: 0.1 10*3/uL (ref 0–0.7)
HCT: 42.5 % (ref 35.0–47.0)
Hemoglobin: 14.2 g/dL (ref 12.0–16.0)
LYMPHS ABS: 1.8 10*3/uL (ref 1.0–3.6)
LYMPHS PCT: 29 %
MCH: 30.8 pg (ref 26.0–34.0)
MCHC: 33.4 g/dL (ref 32.0–36.0)
MCV: 92 fL (ref 80.0–100.0)
Monocytes Absolute: 0.6 10*3/uL (ref 0.2–0.9)
Monocytes Relative: 9 %
Neutro Abs: 3.7 10*3/uL (ref 1.4–6.5)
Neutrophils Relative %: 60 %
PLATELETS: 291 10*3/uL (ref 150–440)
RBC: 4.62 MIL/uL (ref 3.80–5.20)
RDW: 13.1 % (ref 11.5–14.5)
WBC: 6.2 10*3/uL (ref 3.6–11.0)

## 2015-06-16 IMAGING — CT CT ABD-PELV W/ CM
1 of 3 series · 14 of 32 positions shown, 19 images · IV contrast (omnipaque)
Comparison: [DATE]

CLINICAL DATA: Recent diagnosis of acute diverticulitis with
abscess formation, followup examination

EXAM:
CT ABDOMEN AND PELVIS WITH CONTRAST
TECHNIQUE: Multidetector CT imaging of the abdomen and pelvis was performed
using the standard protocol following bolus administration of
intravenous contrast.
CONTRAST:  75mL OMNIPAQUE IOHEXOL 350 MG/ML SOLN

[Series 2: routine abd pel with · axial · 0.70mm/px · z∈[-966,-592]mm · 14 of 85 slices shown, 19 images]
[im 5/85  soft-tissue]
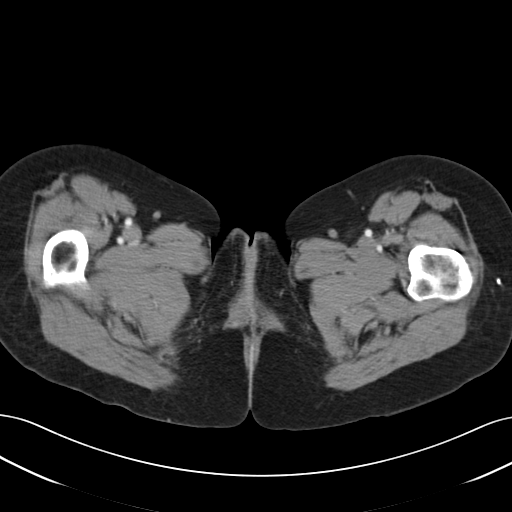
[im 5/85  bone]
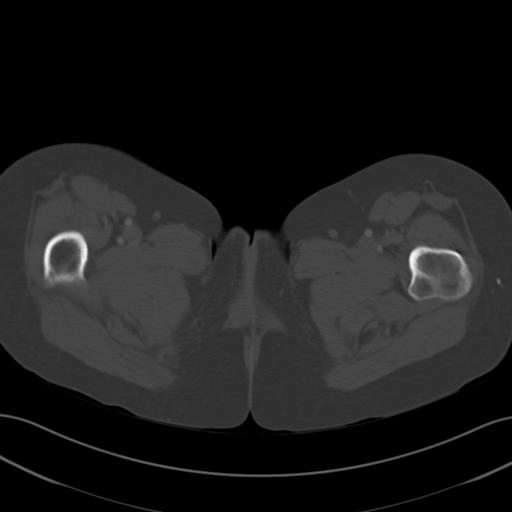
[im 14/85  soft-tissue]
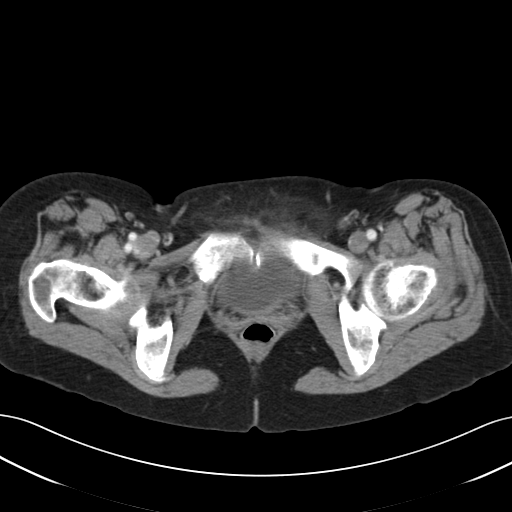
[im 18/85  soft-tissue]
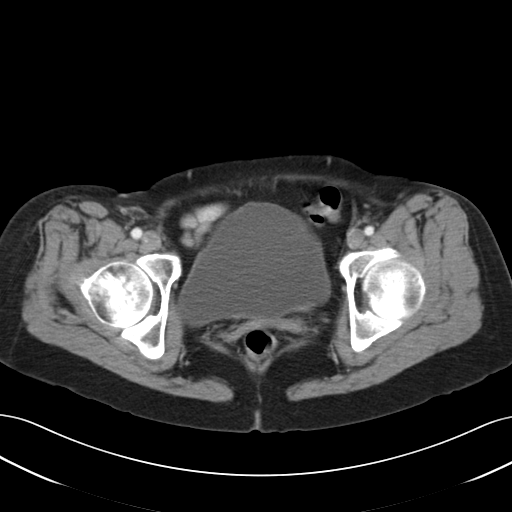
[im 23/85  soft-tissue]
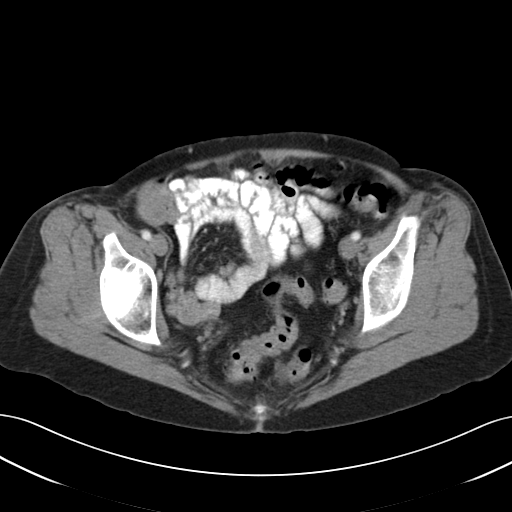
[im 31/85  soft-tissue]
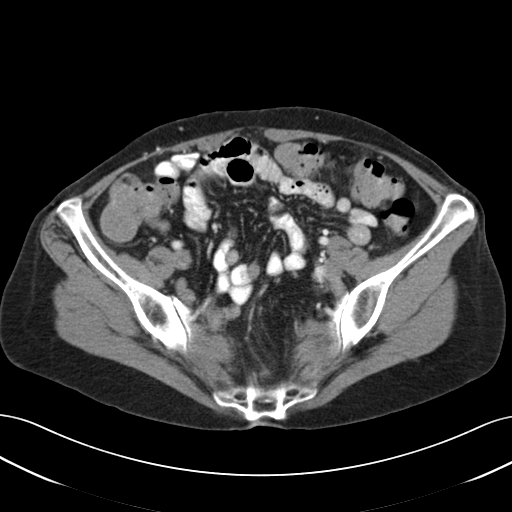
[im 36/85  soft-tissue]
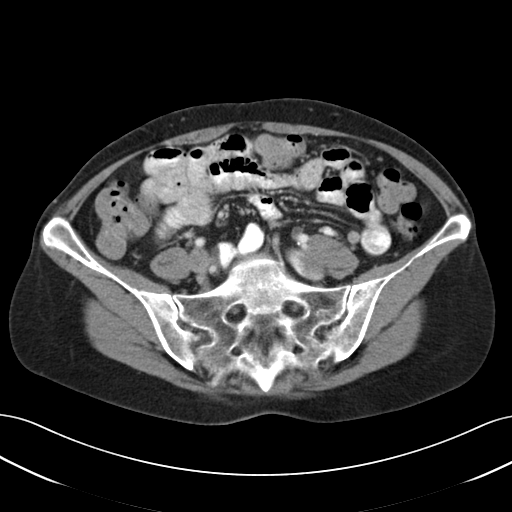
[im 45/85  soft-tissue]
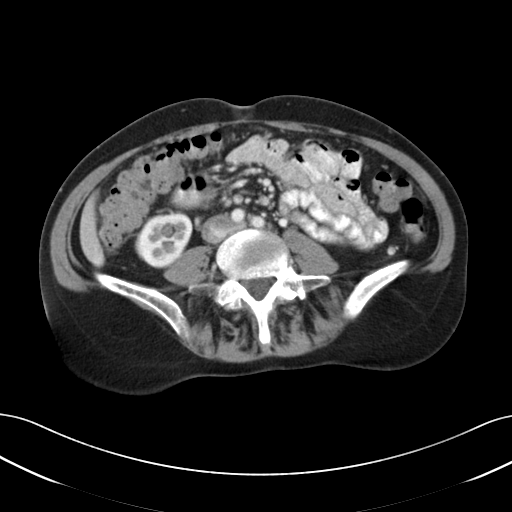
[im 49/85  soft-tissue]
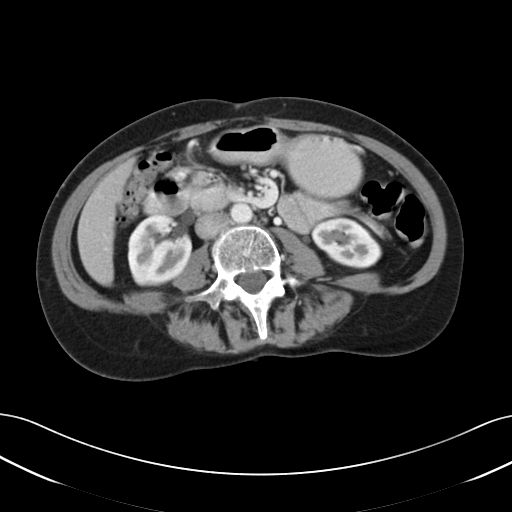
[im 54/85  soft-tissue]
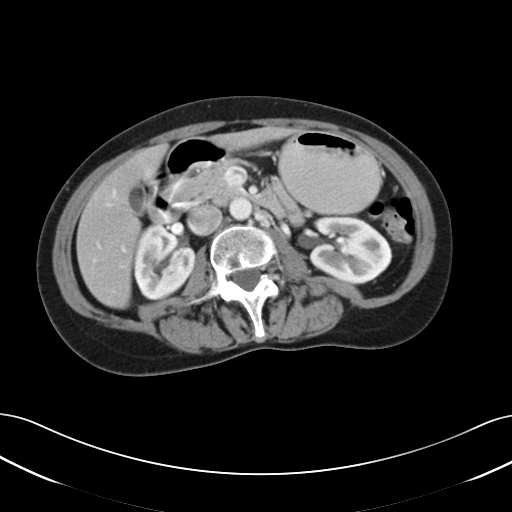
[im 54/85  bone]
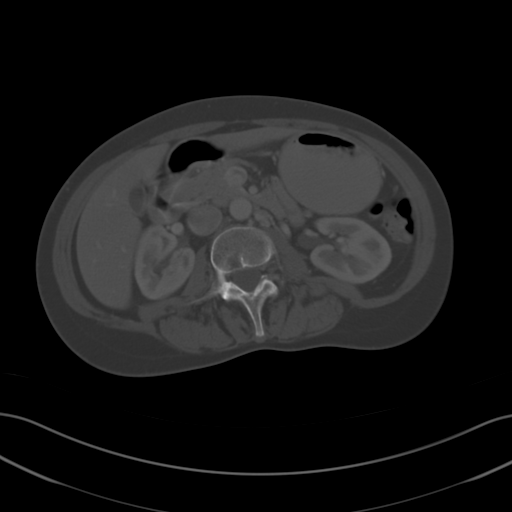
[im 62/85  soft-tissue]
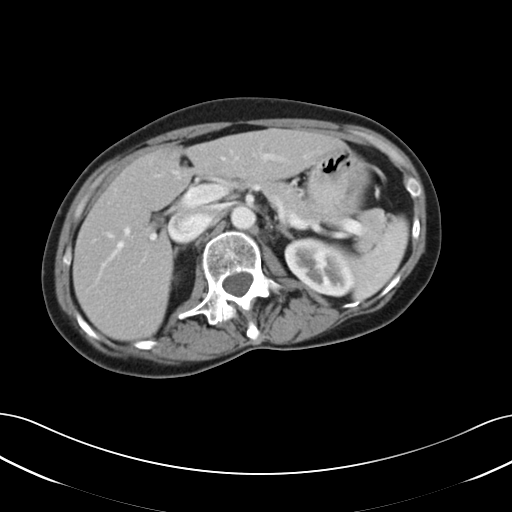
[im 67/85  soft-tissue]
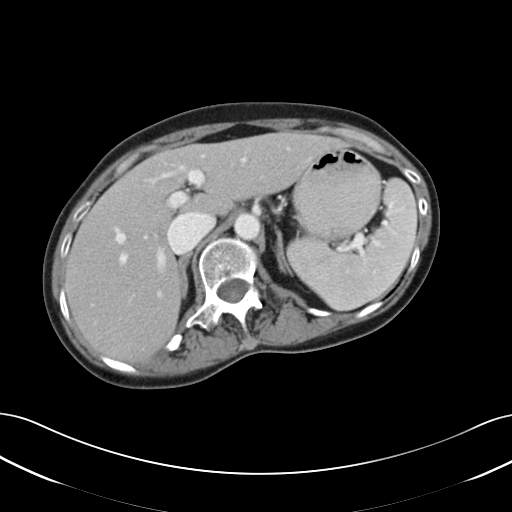
[im 67/85  lung]
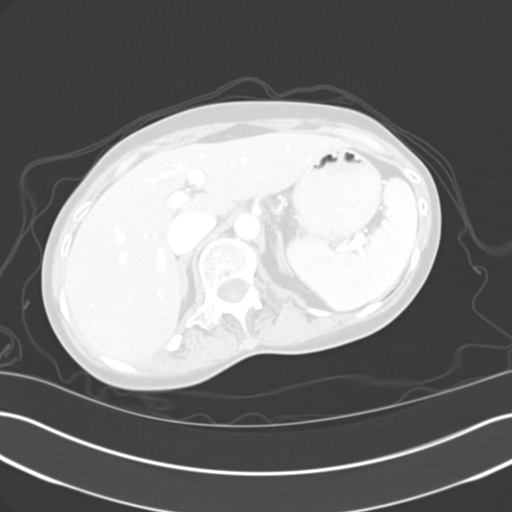
[im 71/85  soft-tissue]
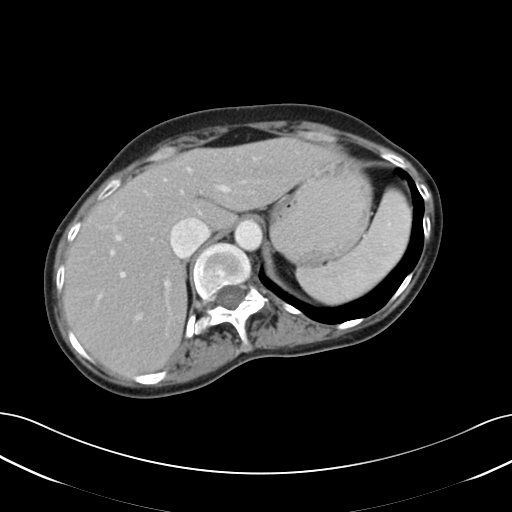
[im 71/85  lung]
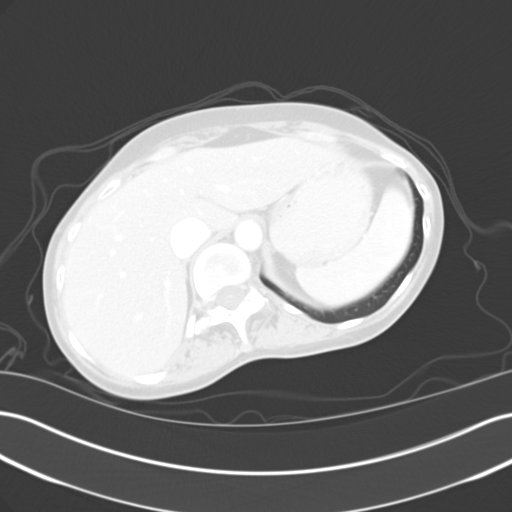
[im 76/85  lung]
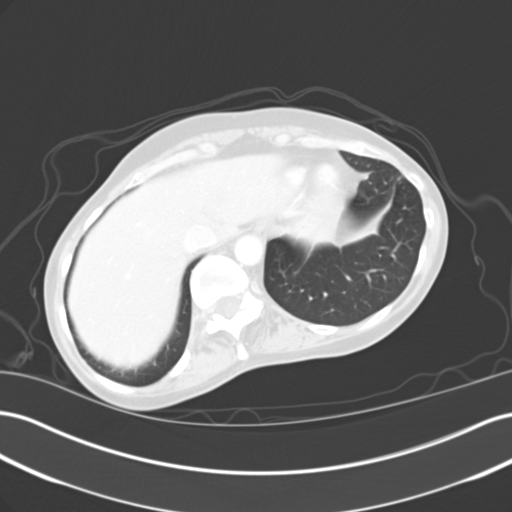
[im 80/85  soft-tissue]
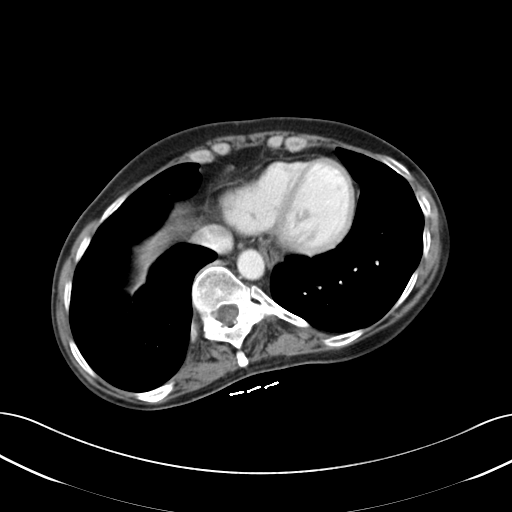
[im 80/85  lung]
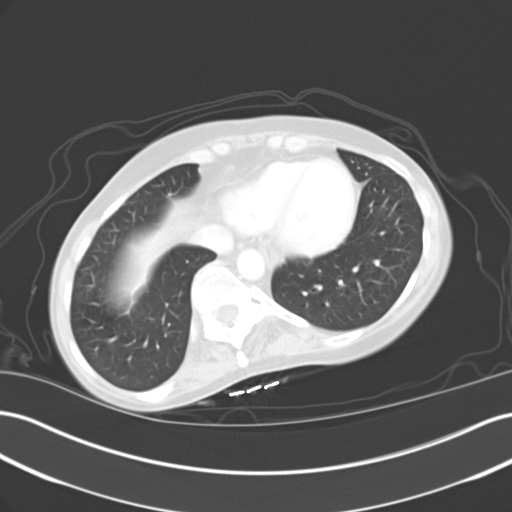

[14 of 32 positions shown; findings below may reference images not displayed]

FINDINGS: Lung bases are free of acute infiltrate or sizable effusion.

The liver is diffusely decreased in attenuation consistent with
fatty infiltration. The gallbladder is decompressed. The spleen,
adrenal glands and pancreas are within normal limits. Kidney show a
normal enhancement pattern without renal calculi or obstructive
changes. Normal excretion of contrast is seen.

The bladder is well distended. No intraluminal filling defect is
seen. Diverticular change of the colon is noted. No findings to
suggest acute diverticulitis are seen. The previously noted
inflammatory change and small fluid collection have nearly
completely resolved from the prior study. No definitive free air is
seen. No new focal perforation or fluid collection is noted.

The bony structures are within normal limits. Stable scoliosis is
noted. The uterus has been surgically removed. The appendix is
within normal limits.
IMPRESSION: Near-complete resolution of the previously seen changes of
diverticulitis. Particularly, the small fluid collection has nearly
completely resolved. No sizable abscess is seen.

No new focal abnormality is noted.

## 2015-06-16 MED ORDER — METRONIDAZOLE 500 MG PO TABS: 500.0000 mg | ORAL_TABLET | Freq: Three times a day (TID) | ORAL | Status: DC

## 2015-06-16 MED ORDER — IOHEXOL 350 MG/ML SOLN
75.0000 mL | Freq: Once | INTRAVENOUS | Status: AC | PRN
  Administered 2015-06-16: 75 mL via INTRAVENOUS

## 2015-06-16 MED ORDER — CIPRO 500 MG PO TABS: 500.0000 mg | ORAL_TABLET | Freq: Two times a day (BID) | ORAL | Status: DC

## 2015-06-16 NOTE — Telephone Encounter (Signed)
Called patient per Dr. Burt Knack to let her know that her CT Scan was near-complete resolution of the previously seen changes of diverticulitis. The small fluid collection has nearly completely resolved. No sizable abscess is seen. No new focal abnormality was noted. I also told her that her CBC was getting better too. I told patient to continue taking her antibiotics including her refill ones. Patient was excited to hear the great news.  Patient wanted to know what she was able to take for her constipation. I told her that she could buy Dulcolax OTC and to take it daily. Patient understood and had no further questions.

## 2015-06-16 NOTE — Progress Notes (Signed)
Outpatient Surgical Follow Up  06/16/2015  Kathryn Fuller is an 44 y.o. female.   CC: acute diverticulitis  HPI: patient was admitted the hospital last week with acute diverticulitis and was treated with IV anabiotic's her CT scan showed a 2 cm abscess suggestive of microperforation. In the hospital she did well on IV anabiotic's was transitioned to oral antibiotic's and discharged. Is here for follow-up.  Past Medical History  Diagnosis Date  . Pneumonia   . Endometriosis     Past Surgical History  Procedure Laterality Date  . Inguinal hernia repair    . Abdominal hysterectomy      TAH-BSO    Family History  Problem Relation Age of Onset  . Diabetes Mother   . Heart disease Father   . Heart disease Maternal Uncle   . Heart disease Maternal Grandmother     Social History:  reports that she has been smoking Cigarettes.  She has a 25 pack-year smoking history. She has never used smokeless tobacco. She reports that she does not drink alcohol or use illicit drugs.  Allergies: No Known Allergies  Medications reviewed.   Review of Systems:   Review of Systems  Constitutional: Positive for malaise/fatigue. Negative for fever and chills.  HENT: Negative.   Eyes: Negative.   Respiratory: Negative.   Cardiovascular: Negative.  Negative for chest pain and palpitations.  Gastrointestinal: Positive for abdominal pain, diarrhea and constipation. Negative for heartburn, nausea, vomiting, blood in stool and melena.       She is having in frequent bowel movements but when she does they are very loose.  Genitourinary: Negative for dysuria and urgency.  Musculoskeletal: Negative.   Skin: Negative.   Neurological: Negative.  Negative for weakness.  Endo/Heme/Allergies: Negative.   Psychiatric/Behavioral: Negative.      Physical Exam:  There were no vitals taken for this visit.  Physical Exam  Constitutional: She is oriented to person, place, and time and well-developed,  well-nourished, and in no distress. No distress.  HENT:  Head: Normocephalic and atraumatic.  Eyes: Pupils are equal, round, and reactive to light. Right eye exhibits no discharge. Left eye exhibits no discharge. No scleral icterus.  Neck: Normal range of motion.  Cardiovascular: Normal rate, regular rhythm and normal heart sounds.   Pulmonary/Chest: Effort normal and breath sounds normal. No respiratory distress. She has no wheezes. She has no rales.  Abdominal: Soft. She exhibits no distension. There is tenderness. There is no rebound and no guarding.  Minimal tenderness in left lower quadrant no peritoneal signs, and animal suprapubic tenderness  Musculoskeletal: Normal range of motion. She exhibits no edema.  Lymphadenopathy:    She has no cervical adenopathy.  Neurological: She is alert and oriented to person, place, and time.  Skin: Skin is warm and dry. No erythema.  Psychiatric: Mood, affect and judgment normal.      No results found for this or any previous visit (from the past 48 hour(s)). No results found.  Assessment/Plan:   acute diverticulitis with microperforation and  2 cm abscess which has likely resolved. She was treated initially with IV anabiotic's and now oral antibiotic's. She has improved considerably but has not resolved. With the findings of a small abscess previously I would like to repeat her CAT scan at this point and see if that is resolving or worsening and would continue on the oral antibiotic's for now I will refill her oral antibiotic's and call her with results of the CT scan.  Kathryn Fuller  Melchor Amour, MD, FACS

## 2015-07-06 ENCOUNTER — Telehealth: Payer: Self-pay

## 2015-07-06 NOTE — Telephone Encounter (Signed)
Patient called back and I reminded her to call [facility] to schedule her Dexa and Mammo.

## 2015-07-06 NOTE — Telephone Encounter (Signed)
-----   Message from Arnetha Courser, MD sent at 07/01/2015  4:43 PM EDT ----- Regarding: Overdue DEXA   ----- Message -----    From: SYSTEM    Sent: 03/26/2015  12:04 AM      To: Arnetha Courser, MD

## 2015-07-25 ENCOUNTER — Ambulatory Visit
Admission: RE | Admit: 2015-07-25 | Discharge: 2015-07-25 | Disposition: A | Discharge status: Home / Self Care | Payer: 59 | Source: Ambulatory Visit | Attending: Family Medicine | Admitting: Family Medicine

## 2015-07-25 DIAGNOSIS — Z90710 Acquired absence of both cervix and uterus: Secondary | ICD-10-CM | POA: Insufficient documentation

## 2015-07-25 DIAGNOSIS — Z13820 Encounter for screening for osteoporosis: Secondary | ICD-10-CM | POA: Diagnosis present

## 2015-07-25 DIAGNOSIS — Z1239 Encounter for other screening for malignant neoplasm of breast: Secondary | ICD-10-CM

## 2015-07-25 DIAGNOSIS — Z1231 Encounter for screening mammogram for malignant neoplasm of breast: Secondary | ICD-10-CM | POA: Diagnosis not present

## 2015-07-25 DIAGNOSIS — M419 Scoliosis, unspecified: Secondary | ICD-10-CM | POA: Insufficient documentation

## 2015-07-25 DIAGNOSIS — Z780 Asymptomatic menopausal state: Secondary | ICD-10-CM | POA: Insufficient documentation

## 2015-07-25 IMAGING — MG MM DIGITAL SCREENING BILAT W/ CAD
4 series · 4 of 4 positions shown · non-contrast
Comparison: Previous exam(s).

CLINICAL DATA: Screening.

EXAM:
DIGITAL SCREENING BILATERAL MAMMOGRAM WITH CAD

[R CC]
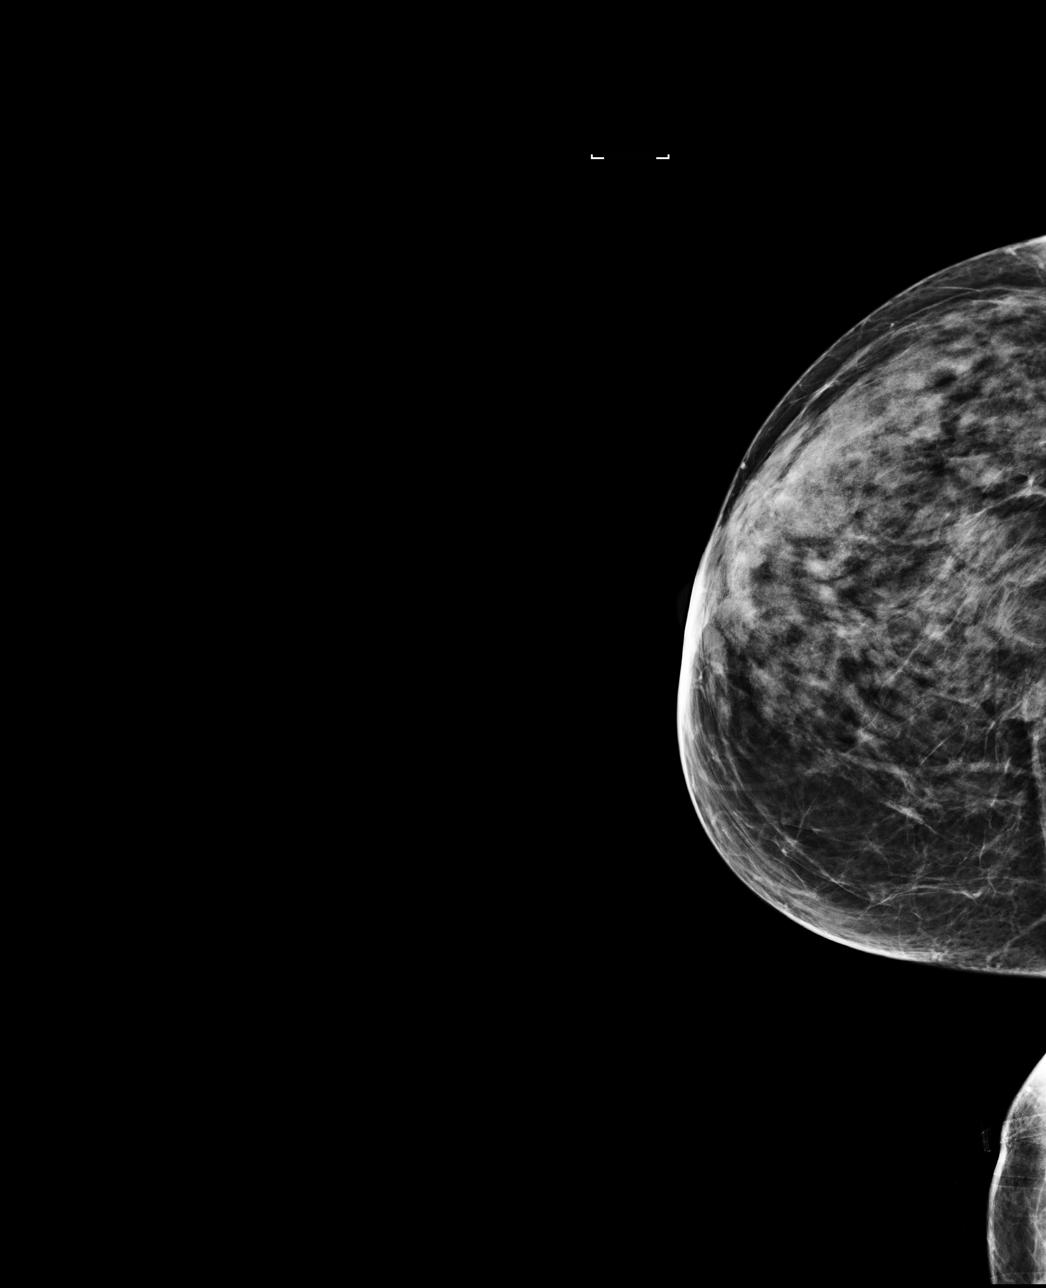

[L MLO]
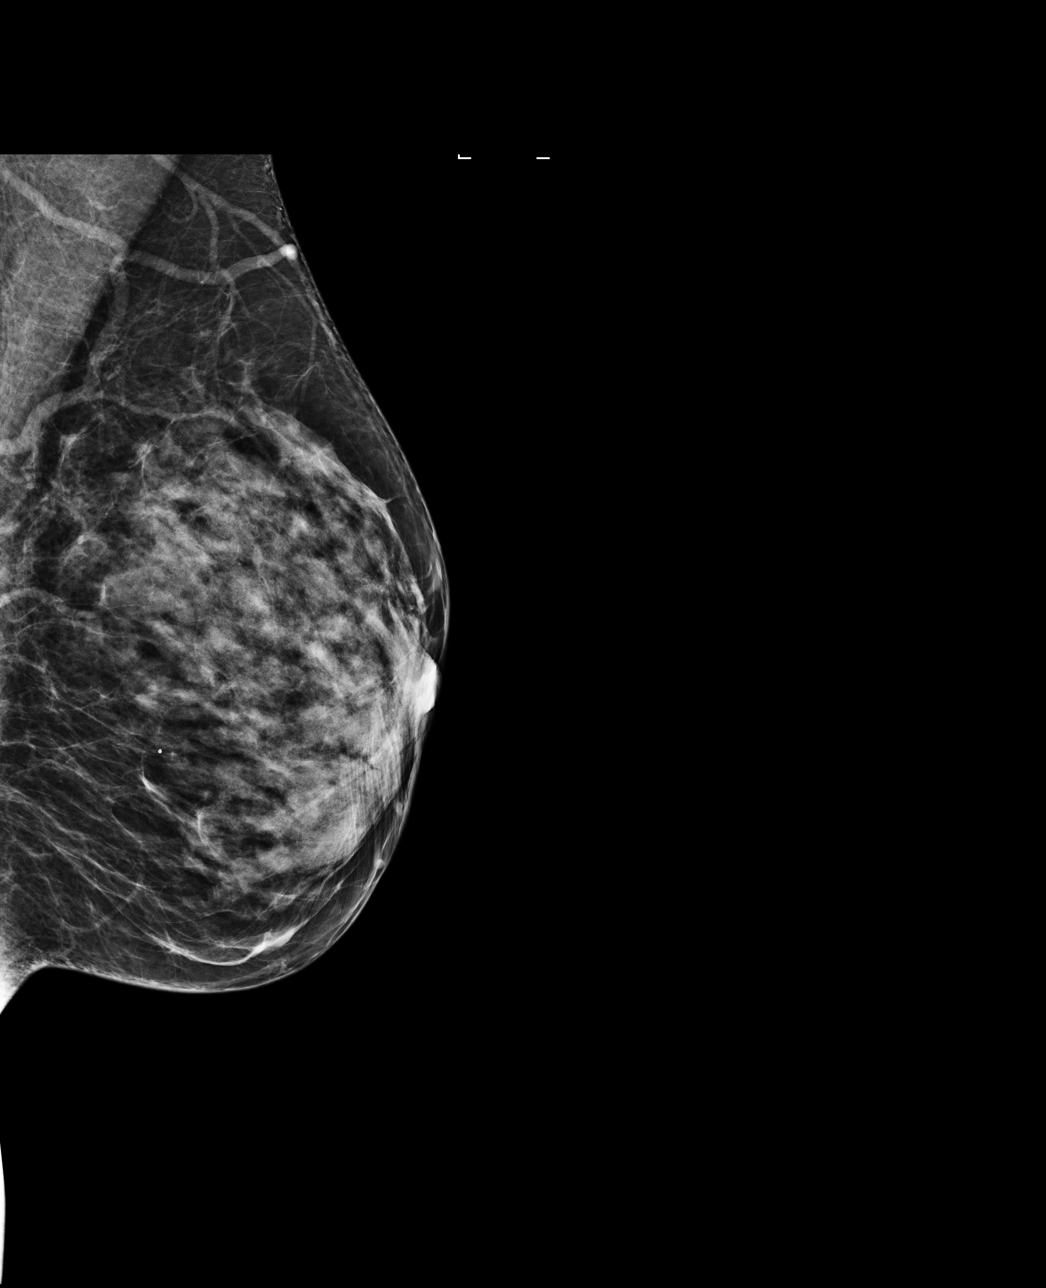

[L CC]
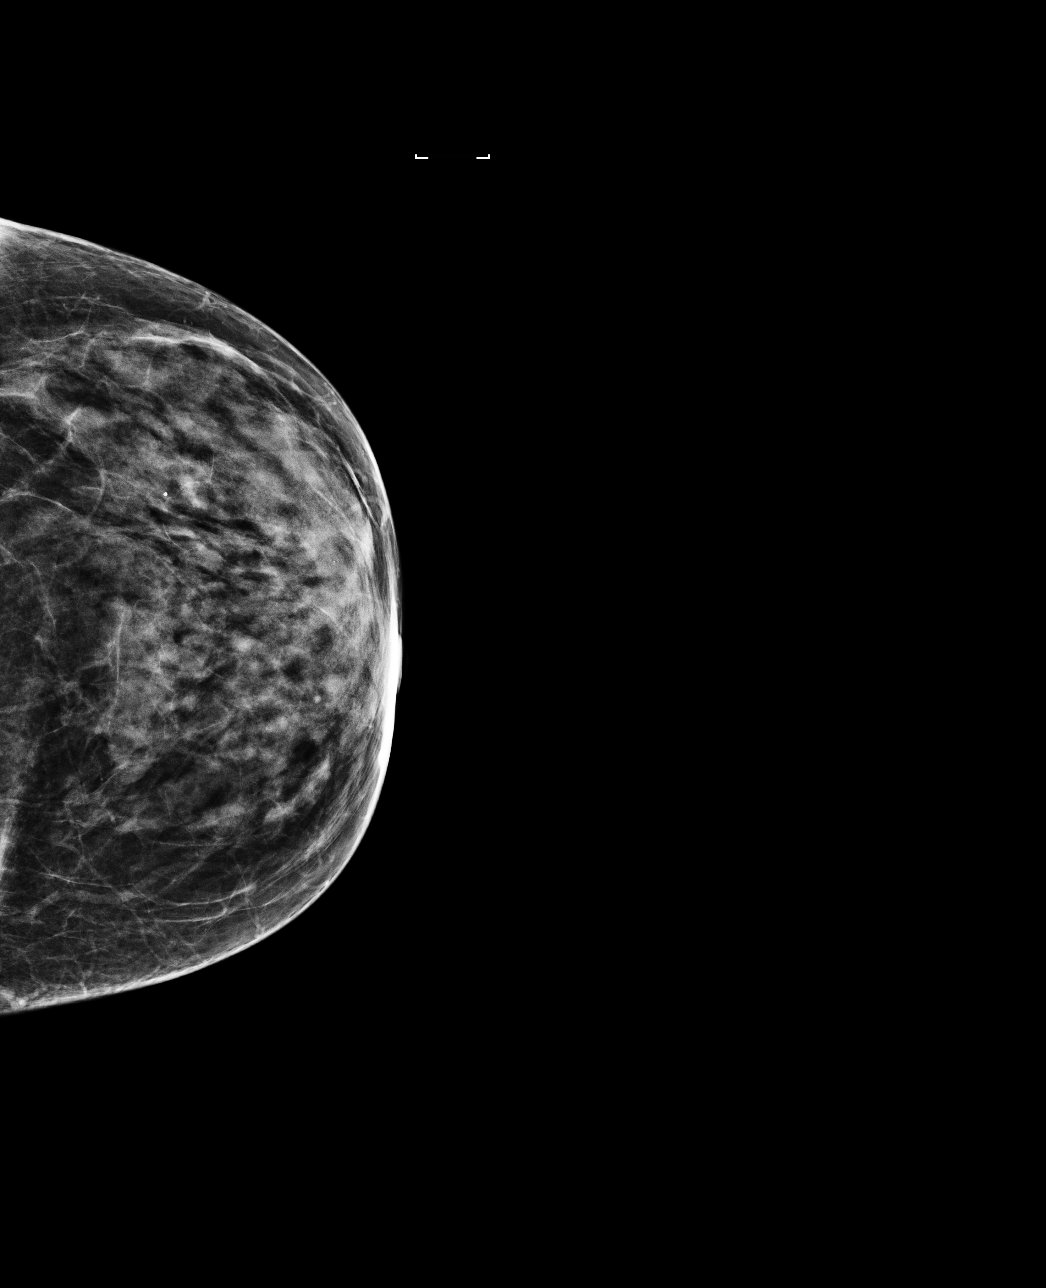

[R MLO]
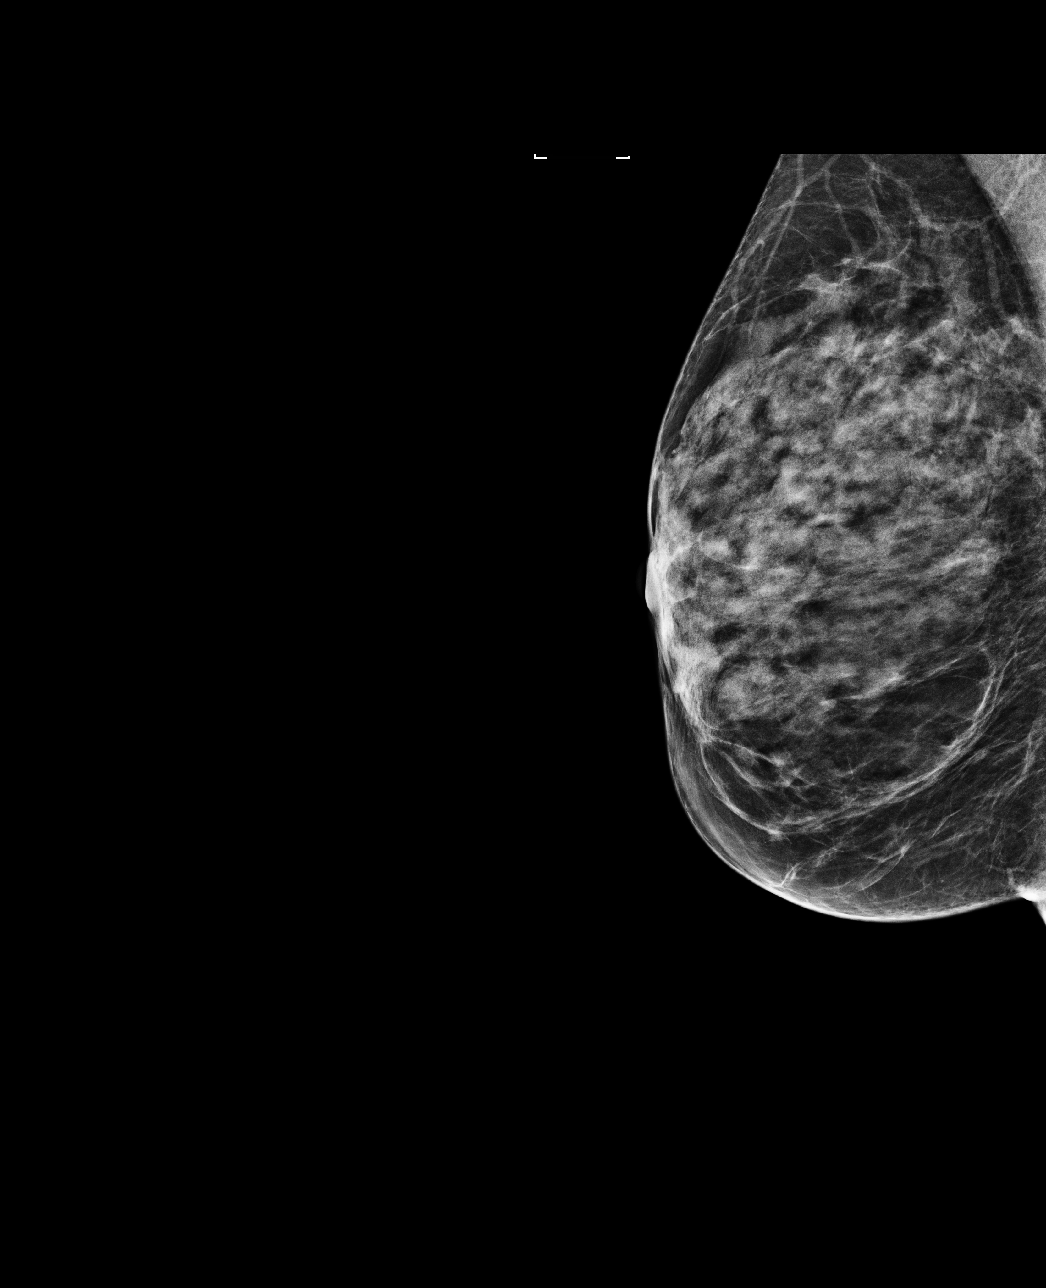

[4 of 4 positions shown; findings below may reference images not displayed]

ACR Breast Density Category c: The breast tissue is heterogeneously
dense, which may obscure small masses.
FINDINGS: There are no findings suspicious for malignancy. Images were
processed with CAD.
IMPRESSION: No mammographic evidence of malignancy. A result letter of this
screening mammogram will be mailed directly to the patient.

RECOMMENDATION:
Screening mammogram in one year. (Code:[0J])

BI-RADS CATEGORY  1: Negative.

## 2015-08-09 ENCOUNTER — Encounter: Payer: Self-pay | Admitting: Family Medicine

## 2015-08-09 ENCOUNTER — Ambulatory Visit (INDEPENDENT_AMBULATORY_CARE_PROVIDER_SITE_OTHER): Payer: 59 | Admitting: Family Medicine

## 2015-08-09 ENCOUNTER — Ambulatory Visit: Payer: Self-pay | Admitting: Family Medicine

## 2015-08-09 VITALS — BP 102/70 | HR 69 | Temp 97.4°F | Wt 121.0 lb

## 2015-08-09 DIAGNOSIS — M810 Age-related osteoporosis without current pathological fracture: Secondary | ICD-10-CM | POA: Diagnosis not present

## 2015-08-09 DIAGNOSIS — Z720 Tobacco use: Secondary | ICD-10-CM | POA: Diagnosis not present

## 2015-08-09 MED ORDER — ALENDRONATE SODIUM 70 MG PO TABS: 70.0000 mg | ORAL_TABLET | ORAL | Status: DC

## 2015-08-09 NOTE — Assessment & Plan Note (Addendum)
So important to try to quit smoking; explained association with bone loss; I am here to help if and when she is finally ready to quit

## 2015-08-09 NOTE — Progress Notes (Signed)
BP 102/70 mmHg  Pulse 69  Temp(Src) 97.4 F (36.3 C)  Wt 121 lb (54.885 kg)  SpO2 97%   Subjective:    Patient ID: Kathryn Fuller, female    DOB: 1971-01-08, 44 y.o.   MRN: [id number]  HPI: Kathryn Fuller is a 44 y.o. female  Chief Complaint  Patient presents with  . Bone Density    Discuss Bone Density Results   This was the first DEXA scan she ever had and it discovered osteoporosis; she is here to discuss this She thinks her grandmother maybe had thin bones; no fractures that she knew of Patient does smoke, 1 ppd, changes with stress She did try to quit once and managed to quit on her own for week; stress made her pick back up She did vapor; she took a pill, but wanted to unzip her skin from her body; will not take that again Drinks milk, lots of milk; does eat green beans and peas and lettuce, but no other dark leafy greens; no tofu, no almond milk No history of prednisone use when younger No prolonged bedrest She does stay active and on her feet all day long Full hysterectomy 13 years ago, ovaries removed; they tried her on hormones and she used Novaring for almost a year; she did not have any problems to her knowledge No heartburn  Relevant past medical, surgical, family and social history reviewed and updated as indicated. Interim medical history since our last visit reviewed. Allergies and medications reviewed and updated.  Review of Systems  Per HPI unless specifically indicated above     Objective:    BP 102/70 mmHg  Pulse 69  Temp(Src) 97.4 F (36.3 C)  Wt 121 lb (54.885 kg)  SpO2 97%  Wt Readings from Last 3 Encounters:  08/09/15 121 lb (54.885 kg)  06/16/15 118 lb (53.524 kg)  06/09/15 123 lb 11.2 oz (56.11 kg)    Physical Exam  Constitutional: She appears well-developed and well-nourished.  Appears older than stated age  Cardiovascular: Normal rate and regular rhythm.   Pulmonary/Chest: Effort normal and breath sounds normal.   Musculoskeletal:       Thoracic back: She exhibits no deformity.  No thoracic kyphosis  Skin:  Considerably aged, consistent with smoking      Assessment & Plan:   Problem List Items Addressed This Visit      Musculoskeletal and Integument   Osteoporosis - Primary    Discussed her DEXA in detail and reviewed osteoporosis, factors that hasten bone loss; encouraged smoking cessation; we discussed r/b/a to estrogen therapy for bone loss; she opted for no HRT, but does agree to bisphosphonate therapy; since she is young, we'll try one year of bisphosphonate, then repeat DEXA to see effectiveness; explained five year lifetime limit of this class of meds; explained other options; calcium, vit D, fall prevention, smoking cessation, etc all reviewed; pt agrees with plan      Relevant Medications   alendronate (FOSAMAX) 70 MG tablet     Other   Tobacco abuse    So important to try to quit smoking; explained association with bone loss; I am here to help if and when she is finally ready to quit         Follow up plan: Return in about 1 year (around 08/08/2016) for osteoporosis.  Meds ordered this encounter  Medications  . alendronate (FOSAMAX) 70 MG tablet    Sig: Take 1 tablet (70 mg total) by mouth every 7 (  seven) days. Take with a full glass of water on an empty stomach.    Dispense:  4 tablet    Refill:  11   An after-visit summary was printed and given to the patient at [facility].  Please see the patient instructions which may contain other information and recommendations beyond what is mentioned above in the assessment and plan.

## 2015-08-09 NOTE — Patient Instructions (Addendum)
Do get three servings of calcium daily Take 1000 iu of vitamin D3 daily Start the new medicine for your bones We'll have you take that for one year and then rescan your bones Really try to quit smoking -- so important for your bones Practice good fall precautions Do call 1-800-QUIT-NOW for help quitting your cigarettes if needed DO NOT USE FLAVORED e-cigs AVOID chiropractors  Osteoporosis Osteoporosis is the thinning and loss of density in the bones. Osteoporosis makes the bones more brittle, fragile, and likely to break (fracture). Over time, osteoporosis can cause the bones to become so weak that they fracture after a simple fall. The bones most likely to fracture are the bones in the hip, wrist, and spine. CAUSES  The exact cause is not known. RISK FACTORS Anyone can develop osteoporosis. You may be at greater risk if you have a family history of the condition or have poor nutrition. You may also have a higher risk if you are:   Female.   45 years old or older.  A smoker.  Not physically active.   White or Asian.  Slender. SIGNS AND SYMPTOMS  A fracture might be the first sign of the disease, especially if it results from a fall or injury that would not usually cause a bone to break. Other signs and symptoms include:   Low back and neck pain.  Stooped posture.  Height loss. DIAGNOSIS  To make a diagnosis, your health care provider may:  Take a medical history.  Perform a physical exam.  Order tests, such as:  A bone mineral density test.  A dual-energy X-ray absorptiometry test. TREATMENT  The goal of osteoporosis treatment is to strengthen your bones to reduce your risk of a fracture. Treatment may involve:  Making lifestyle changes, such as:  Eating a diet rich in calcium.  Doing weight-bearing and muscle-strengthening exercises.  Stopping tobacco use.  Limiting alcohol intake.  Taking medicine to slow the process of bone loss or to increase bone  density.  Monitoring your levels of calcium and vitamin D. HOME CARE INSTRUCTIONS  Include calcium and vitamin D in your diet. Calcium is important for bone health, and vitamin D helps the body absorb calcium.  Perform weight-bearing and muscle-strengthening exercises as directed by your health care provider.  Do not use any tobacco products, including cigarettes, chewing tobacco, and electronic cigarettes. If you need help quitting, ask your health care provider.  Limit your alcohol intake.  Take medicines only as directed by your health care provider.  Keep all follow-up visits as directed by your health care provider. This is important.  Take precautions at home to lower your risk of falling, such as:  Keeping rooms well lit and clutter free.  Installing safety rails on stairs.  Using rubber mats in the bathroom and other areas that are often wet or slippery. SEEK IMMEDIATE MEDICAL CARE IF:  You fall or injure yourself.    This information is not intended to replace advice given to you by your health care provider. Make sure you discuss any questions you have with your health care provider.   Document Released: 06/06/2005 Document Revised: 09/17/2014 Document Reviewed: 02/04/2014 Elsevier Interactive Patient Education 2016 [employer]. Steps to Quit Smoking  Smoking tobacco can be harmful to your health and can affect almost every organ in your body. Smoking puts you, and those around you, at risk for developing many serious chronic diseases. Quitting smoking is difficult, but it is one of the best things  that you can do for your health. It is never too late to quit. WHAT ARE THE BENEFITS OF QUITTING SMOKING? When you quit smoking, you lower your risk of developing serious diseases and conditions, such as:  Lung cancer or lung disease, such as COPD.  Heart disease.  Stroke.  Heart attack.  Infertility.  Osteoporosis and bone fractures. Additionally, symptoms such  as coughing, wheezing, and shortness of breath may get better when you quit. You may also find that you get sick less often because your body is stronger at fighting off colds and infections. If you are pregnant, quitting smoking can help to reduce your chances of having a baby of low birth weight. HOW DO I GET READY TO QUIT? When you decide to quit smoking, create a plan to make sure that you are successful. Before you quit:  Pick a date to quit. Set a date within the next two weeks to give you time to prepare.  Write down the reasons why you are quitting. Keep this list in places where you will see it often, such as on your bathroom mirror or in your car or wallet.  Identify the people, places, things, and activities that make you want to smoke (triggers) and avoid them. Make sure to take these actions:  Throw away all cigarettes at home, at work, and in your car.  Throw away smoking accessories, such as Scientist, research (medical).  Clean your car and make sure to empty the ashtray.  Clean your home, including curtains and carpets.  Tell your family, friends, and coworkers that you are quitting. Support from your loved ones can make quitting easier.  Talk with your health care provider about your options for quitting smoking.  Find out what treatment options are covered by your health insurance. WHAT STRATEGIES CAN I USE TO QUIT SMOKING?  Talk with your healthcare provider about different strategies to quit smoking. Some strategies include:  Quitting smoking altogether instead of gradually lessening how much you smoke over a period of time. Research shows that quitting "cold [place]" is more successful than gradually quitting.  Attending in-person counseling to help you build problem-solving skills. You are more likely to have success in quitting if you attend several counseling sessions. Even short sessions of 10 minutes can be effective.  Finding resources and support systems that can  help you to quit smoking and remain smoke-free after you quit. These resources are most helpful when you use them often. They can include:  Online chats with a Social worker.  Telephone quitlines.  Printed Furniture conservator/restorer.  Support groups or group counseling.  Text messaging programs.  Mobile phone applications.  Taking medicines to help you quit smoking. (If you are pregnant or breastfeeding, talk with your health care provider first.) Some medicines contain nicotine and some do not. Both types of medicines help with cravings, but the medicines that include nicotine help to relieve withdrawal symptoms. Your health care provider may recommend:  Nicotine patches, gum, or lozenges.  Nicotine inhalers or sprays.  Non-nicotine medicine that is taken by mouth. Talk with your health care provider about combining strategies, such as taking medicines while you are also receiving in-person counseling. Using these two strategies together makes you more likely to succeed in quitting than if you used either strategy on its own. If you are pregnant or breastfeeding, talk with your health care provider about finding counseling or other support strategies to quit smoking. Do not take medicine to help you quit smoking unless  told to do so by your health care provider. WHAT THINGS CAN I DO TO MAKE IT EASIER TO QUIT? Quitting smoking might feel overwhelming at first, but there is a lot that you can do to make it easier. Take these important actions:  Reach out to your family and friends and ask that they support and encourage you during this time. Call telephone quitlines, reach out to support groups, or work with a counselor for support.  Ask people who smoke to avoid smoking around you.  Avoid places that trigger you to smoke, such as bars, parties, or smoke-break areas at work.  Spend time around people who do not smoke.  Lessen stress in your life, because stress can be a smoking trigger for some  people. To lessen stress, try:  Exercising regularly.  Deep-breathing exercises.  Yoga.  Meditating.  Performing a body scan. This involves closing your eyes, scanning your body from head to toe, and noticing which parts of your body are particularly tense. Purposefully relax the muscles in those areas.  Download or purchase mobile phone or tablet apps (applications) that can help you stick to your quit plan by providing reminders, tips, and encouragement. There are many free apps, such as QuitGuide from the [employer] Office manager for Disease Control and Prevention). You can find other support for quitting smoking (smoking cessation) through smokefree.gov and other websites. HOW WILL I FEEL WHEN I QUIT SMOKING? Within the first 24 hours of quitting smoking, you may start to feel some withdrawal symptoms. These symptoms are usually most noticeable 2-3 days after quitting, but they usually do not last beyond 2-3 weeks. Changes or symptoms that you might experience include:  Mood swings.  Restlessness, anxiety, or irritation.  Difficulty concentrating.  Dizziness.  Strong cravings for sugary foods in addition to nicotine.  Mild weight gain.  Constipation.  Nausea.  Coughing or a sore throat.  Changes in how your medicines work in your body.  A depressed mood.  Difficulty sleeping (insomnia). After the first 2-3 weeks of quitting, you may start to notice more positive results, such as:  Improved sense of smell and taste.  Decreased coughing and sore throat.  Slower heart rate.  Lower blood pressure.  Clearer skin.i  The ability to breathe more easily.  Fewer sick days. Quitting smoking is very challenging for most people. Do not get discouraged if you are not successful the first time. Some people need to make many attempts to quit before they achieve long-term success. Do your best to stick to your quit plan, and talk with your health care provider if you have any questions  or concerns.   This information is not intended to replace advice given to you by your health care provider. Make sure you discuss any questions you have with your health care provider.   Document Released: 08/21/2001 Document Revised: 01/11/2015 Document Reviewed: 01/11/2015 Elsevier Interactive Patient Education [employer].

## 2015-08-13 NOTE — Assessment & Plan Note (Signed)
Discussed her DEXA in detail and reviewed osteoporosis, factors that hasten bone loss; encouraged smoking cessation; we discussed r/b/a to estrogen therapy for bone loss; she opted for no HRT, but does agree to bisphosphonate therapy; since she is young, we'll try one year of bisphosphonate, then repeat DEXA to see effectiveness; explained five year lifetime limit of this class of meds; explained other options; calcium, vit D, fall prevention, smoking cessation, etc all reviewed; pt agrees with plan

## 2016-03-26 ENCOUNTER — Encounter: Payer: 59 | Admitting: Family Medicine

## 2016-07-26 ENCOUNTER — Ambulatory Visit (INDEPENDENT_AMBULATORY_CARE_PROVIDER_SITE_OTHER): Payer: 59 | Admitting: Family Medicine

## 2016-07-26 ENCOUNTER — Encounter (INDEPENDENT_AMBULATORY_CARE_PROVIDER_SITE_OTHER): Payer: Self-pay

## 2016-07-26 ENCOUNTER — Encounter: Payer: Self-pay | Admitting: Family Medicine

## 2016-07-26 VITALS — BP 128/71 | HR 69 | Temp 97.6°F | Ht 65.25 in | Wt 122.0 lb

## 2016-07-26 DIAGNOSIS — R1031 Right lower quadrant pain: Secondary | ICD-10-CM

## 2016-07-26 MED ORDER — CYCLOBENZAPRINE HCL 10 MG PO TABS: 10.0000 mg | ORAL_TABLET | Freq: Three times a day (TID) | ORAL | 0 refills | Status: DC | PRN

## 2016-07-26 NOTE — Patient Instructions (Signed)
Follow up as needed

## 2016-07-26 NOTE — Progress Notes (Signed)
BP 128/71   Pulse 69   Temp 97.6 F (36.4 C)   Ht 5' 5.25" (1.657 m)   Wt 122 lb (55.3 kg)   SpO2 98%   BMI 20.15 kg/m    Subjective:    Patient ID: Kathryn Fuller, female    DOB: [birth year], 45 y.o.   MRN: [id number]  HPI: Kathryn Fuller is a 45 y.o. female  Chief Complaint  Patient presents with  . Knot    on abdomen. Noticed it last night. Was burning, felt like fluid was in it. Feels like theres a pull. Painful at times.   Patient presents for a painful place in RLQ that she first noticed yesterday. States that area of her abdomen looks different - swollen, bulging. Feels a burning, pulling pain there with movement or pressure. Denies fever, chills, N/V/D, or abdominal pain not directly related to the area. No known trauma or movements that are out of the ordinary recently. Has not tried taking anything OTC for sxs. Hx of incisional hernia s/p hysterectomy 10 or so years ago.   Relevant past medical, surgical, family and social history reviewed and updated as indicated. Interim medical history since our last visit reviewed. Allergies and medications reviewed and updated.  Review of Systems  Constitutional: Negative.   HENT: Negative.   Respiratory: Negative.   Cardiovascular: Negative.   Gastrointestinal: Positive for abdominal pain. Negative for constipation, diarrhea, nausea and vomiting.  Genitourinary: Negative.   Musculoskeletal: Negative.   Skin: Negative.   Neurological: Negative.   Psychiatric/Behavioral: Negative.     Per HPI unless specifically indicated above     Objective:    BP 128/71   Pulse 69   Temp 97.6 F (36.4 C)   Ht 5' 5.25" (1.657 m)   Wt 122 lb (55.3 kg)   SpO2 98%   BMI 20.15 kg/m   Wt Readings from Last 3 Encounters:  07/26/16 122 lb (55.3 kg)  08/09/15 121 lb (54.9 kg)  06/16/15 118 lb (53.5 kg)    Physical Exam  Constitutional: She is oriented to person, place, and time. She appears well-developed and  well-nourished. No distress.  HENT:  Head: Atraumatic.  Eyes: Conjunctivae are normal. Pupils are equal, round, and reactive to light. No scleral icterus.  Neck: Normal range of motion. Neck supple.  Cardiovascular: Normal rate and normal heart sounds.   Pulmonary/Chest: Effort normal. No respiratory distress.  Abdominal: Soft. Bowel sounds are normal. There is tenderness (RLQ point tenderness ).  No specific abdominal wall defect palpated, but a subtle bulge is palpable in the area of tenderness. Some mild bruising over the area   Musculoskeletal: Normal range of motion.  Neurological: She is alert and oriented to person, place, and time.  Skin: Skin is warm and dry.  Psychiatric: She has a normal mood and affect. Her behavior is normal.  Nursing note and vitals reviewed.     Assessment & Plan:   Problem List Items Addressed This Visit    None    Visit Diagnoses    RLQ abdominal pain    -  Primary   Suspect hernia vs muscle strain. Flexeril sent, ice/heat to the area, ibuprofen prn for pain.    Relevant Orders   US Abdomen Limited    Long discussion with patient about options: ultrasound vs close monitoring. Pt elects to get the ultrasound as soon as possible. Discussed precautions and when to go to the ER right away for STAT imaging.  Follow up plan: Return if symptoms worsen or fail to improve.

## 2016-07-27 ENCOUNTER — Encounter: Payer: Self-pay | Admitting: Family Medicine

## 2016-07-30 ENCOUNTER — Other Ambulatory Visit: Payer: Self-pay | Admitting: Family Medicine

## 2016-08-01 ENCOUNTER — Telehealth: Payer: Self-pay | Admitting: Family Medicine

## 2016-08-01 ENCOUNTER — Ambulatory Visit
Admission: RE | Admit: 2016-08-01 | Discharge: 2016-08-01 | Disposition: A | Discharge status: Home / Self Care | Payer: 59 | Source: Ambulatory Visit | Attending: Family Medicine | Admitting: Family Medicine

## 2016-08-01 DIAGNOSIS — R1031 Right lower quadrant pain: Secondary | ICD-10-CM | POA: Diagnosis not present

## 2016-08-01 NOTE — Telephone Encounter (Signed)
Please call pt and let her know that her ultrasound came back negative for any abnormalities. Likely a muscle strain there. Continue rest, ice/heat to the area. Follow up if no improvement over the next week or two.

## 2016-08-01 NOTE — Telephone Encounter (Signed)
Left message to call.

## 2016-08-01 NOTE — Telephone Encounter (Signed)
Pt called back for results, discussed results and care plan. Pt understanding and agreeable.

## 2016-10-05 ENCOUNTER — Ambulatory Visit (INDEPENDENT_AMBULATORY_CARE_PROVIDER_SITE_OTHER): Payer: 59 | Admitting: Family Medicine

## 2016-10-05 ENCOUNTER — Encounter: Payer: Self-pay | Admitting: Family Medicine

## 2016-10-05 DIAGNOSIS — R1031 Right lower quadrant pain: Secondary | ICD-10-CM | POA: Diagnosis not present

## 2016-10-05 DIAGNOSIS — M818 Other osteoporosis without current pathological fracture: Secondary | ICD-10-CM

## 2016-10-05 DIAGNOSIS — Z1231 Encounter for screening mammogram for malignant neoplasm of breast: Secondary | ICD-10-CM

## 2016-10-05 DIAGNOSIS — Z720 Tobacco use: Secondary | ICD-10-CM | POA: Diagnosis not present

## 2016-10-05 DIAGNOSIS — R1012 Left upper quadrant pain: Secondary | ICD-10-CM | POA: Insufficient documentation

## 2016-10-05 DIAGNOSIS — Z0000 Encounter for general adult medical examination without abnormal findings: Secondary | ICD-10-CM

## 2016-10-05 DIAGNOSIS — Z1239 Encounter for other screening for malignant neoplasm of breast: Secondary | ICD-10-CM

## 2016-10-05 LAB — CBC WITH DIFFERENTIAL/PLATELET
BASOS PCT: 1 %
Basophils Absolute: 64 cells/uL (ref 0–200)
EOS ABS: 128 {cells}/uL (ref 15–500)
EOS PCT: 2 %
HCT: 49 % — ABNORMAL HIGH (ref 35.0–45.0)
Hemoglobin: 16.5 g/dL — ABNORMAL HIGH (ref 11.7–15.5)
LYMPHS ABS: 2112 {cells}/uL (ref 850–3900)
Lymphocytes Relative: 33 %
MCH: 31.4 pg (ref 27.0–33.0)
MCHC: 33.7 g/dL (ref 32.0–36.0)
MCV: 93.3 fL (ref 80.0–100.0)
MONOS PCT: 10 %
MPV: 9.5 fL (ref 7.5–12.5)
Monocytes Absolute: 640 cells/uL (ref 200–950)
NEUTROS ABS: 3456 {cells}/uL (ref 1500–7800)
Neutrophils Relative %: 54 %
Platelets: 292 10*3/uL (ref 140–400)
RBC: 5.25 MIL/uL — ABNORMAL HIGH (ref 3.80–5.10)
RDW: 13.2 % (ref 11.0–15.0)
WBC: 6.4 10*3/uL (ref 3.8–10.8)

## 2016-10-05 NOTE — Assessment & Plan Note (Signed)
USPSTF grade A and B recommendations reviewed with patient; age-appropriate recommendations, preventive care, screening tests, etc discussed and encouraged; healthy living encouraged; see AVS for patient education given to patient  

## 2016-10-05 NOTE — Assessment & Plan Note (Signed)
Order CT scan; reviewed [place]; stool cards given, to be returned soon
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
## 2016-10-05 NOTE — Assessment & Plan Note (Signed)
Check CT scan and urine

## 2016-10-05 NOTE — Assessment & Plan Note (Signed)
Patient refuses bisphosphonates; get weight bearing activity, three servings of calcium a day; 1000 iu vit D3 daily; fall precautions; she is not ready to quit smoking, unfortunately

## 2016-10-05 NOTE — Assessment & Plan Note (Signed)
Order mammo 

## 2016-10-05 NOTE — Patient Instructions (Addendum)
I do encourage you to quit smoking Call [phone] to sign up for smoking cessation classes You can call 1-800-QUIT-NOW to talk with a smoking cessation coach Start 1,000 iu of vitamin D3 daily Good fall precautions We'll get a CT scan We'll get labs today If you have not heard anything from my staff in a week about any orders/referrals/studies from today, please contact us here to follow-up [phone] You will be due for your next bone density scan in November 2018  Health Maintenance, Female Introduction Adopting a healthy lifestyle and getting preventive care can go a long way to promote health and wellness. Talk with your health care provider about what schedule of regular examinations is right for you. This is a good chance for you to check in with your provider about disease prevention and staying healthy. In between checkups, there are plenty of things you can do on your own. Experts have done a lot of research about which lifestyle changes and preventive measures are most likely to keep you healthy. Ask your health care provider for more information. Weight and diet Eat a healthy diet  Be sure to include plenty of vegetables, fruits, low-fat dairy products, and lean protein.  Do not eat a lot of foods high in solid fats, added sugars, or salt.  Get regular exercise. This is one of the most important things you can do for your health.  Most adults should exercise for at least 150 minutes each week. The exercise should increase your heart rate and make you sweat (moderate-intensity exercise).  Most adults should also do strengthening exercises at least twice a week. This is in addition to the moderate-intensity exercise. Maintain a healthy weight  Body mass index (BMI) is a measurement that can be used to identify possible weight problems. It estimates body fat based on height and weight. Your health care provider can help determine your BMI and help you achieve or maintain a  healthy weight.  For females 20 years of age and older:  A BMI below 18.5 is considered underweight.  A BMI of 18.5 to 24.9 is normal.  A BMI of 25 to 29.9 is considered overweight.  A BMI of 30 and above is considered obese. Watch levels of cholesterol and blood lipids  You should start having your blood tested for lipids and cholesterol at 47 years of age, then have this test every 5 years.  You may need to have your cholesterol levels checked more often if:  Your lipid or cholesterol levels are high.  You are older than 47 years of age.  You are at high risk for heart disease. Cancer screening Lung Cancer  Lung cancer screening is recommended for adults 55-80 years old who are at high risk for lung cancer because of a history of smoking.  A yearly low-dose CT scan of the lungs is recommended for people who:  Currently smoke.  Have quit within the past 15 years.  Have at least a 30-pack-year history of smoking. A pack year is smoking an average of one pack of cigarettes a day for 1 year.  Yearly screening should continue until it has been 15 years since you quit.  Yearly screening should stop if you develop a health problem that would prevent you from having lung cancer treatment. Breast Cancer  Practice breast self-awareness. This means understanding how your breasts normally appear and feel.  It also means doing regular breast self-exams. Let your health care provider know about any changes, no   matter how small.  If you are in your 20s or 30s, you should have a clinical breast exam (CBE) by a health care provider every 1-3 years as part of a regular health exam.  If you are 17 or older, have a CBE every year. Also consider having a breast X-ray (mammogram) every year.  If you have a family history of breast cancer, talk to your health care provider about genetic screening.  If you are at high risk for breast cancer, talk to your health care provider about having  an MRI and a mammogram every year.  Breast cancer gene (BRCA) assessment is recommended for women who have family members with BRCA-related cancers. BRCA-related cancers include:  Breast.  Ovarian.  Tubal.  Peritoneal cancers.  Results of the assessment will determine the need for genetic counseling and BRCA1 and BRCA2 testing. Cervical Cancer  Your health care provider may recommend that you be screened regularly for cancer of the pelvic organs (ovaries, uterus, and vagina). This screening involves a pelvic examination, including checking for microscopic changes to the surface of your cervix (Pap test). You may be encouraged to have this screening done every 3 years, beginning at age 85.  For women ages 12-65, health care providers may recommend pelvic exams and Pap testing every 3 years, or they may recommend the Pap and pelvic exam, combined with testing for human papilloma virus (HPV), every 5 years. Some types of HPV increase your risk of cervical cancer. Testing for HPV may also be done on women of any age with unclear Pap test results.  Other health care providers may not recommend any screening for nonpregnant women who are considered low risk for pelvic cancer and who do not have symptoms. Ask your health care provider if a screening pelvic exam is right for you.  If you have had past treatment for cervical cancer or a condition that could lead to cancer, you need Pap tests and screening for cancer for at least 20 years after your treatment. If Pap tests have been discontinued, your risk factors (such as having a new sexual partner) need to be reassessed to determine if screening should resume. Some women have medical problems that increase the chance of getting cervical cancer. In these cases, your health care provider may recommend more frequent screening and Pap tests. Colorectal Cancer  This type of cancer can be detected and often prevented.  Routine colorectal cancer screening  usually begins at 47 years of age and continues through 47 years of age.  Your health care provider may recommend screening at an earlier age if you have risk factors for colon cancer.  Your health care provider may also recommend using home test kits to check for hidden blood in the stool.  A small camera at the end of a tube can be used to examine your colon directly (sigmoidoscopy or colonoscopy). This is done to check for the earliest forms of colorectal cancer.  Routine screening usually begins at age 34.  Direct examination of the colon should be repeated every 5-10 years through 47 years of age. However, you may need to be screened more often if early forms of precancerous polyps or small growths are found. Skin Cancer  Check your skin from head to toe regularly.  Tell your health care provider about any new moles or changes in moles, especially if there is a change in a mole's shape or color.  Also tell your health care provider if you have a mole  that is larger than the size of a pencil eraser.  Always use sunscreen. Apply sunscreen liberally and repeatedly throughout the day.  Protect yourself by wearing long sleeves, pants, a wide-brimmed hat, and sunglasses whenever you are outside. Heart disease, diabetes, and high blood pressure  High blood pressure causes heart disease and increases the risk of stroke. High blood pressure is more likely to develop in:  People who have blood pressure in the high end of the normal range (130-139/85-89 mm Hg).  People who are overweight or obese.  People who are African American.  If you are 59-69 years of age, have your blood pressure checked every 3-5 years. If you are 50 years of age or older, have your blood pressure checked every year. You should have your blood pressure measured twice-once when you are at a hospital or clinic, and once when you are not at a hospital or clinic. Record the average of the two measurements. To check your  blood pressure when you are not at a hospital or clinic, you can use:  An automated blood pressure machine at a pharmacy.  A home blood pressure monitor.  If you are between 70 years and 36 years old, ask your health care provider if you should take aspirin to prevent strokes.  Have regular diabetes screenings. This involves taking a blood sample to check your fasting blood sugar level.  If you are at a normal weight and have a low risk for diabetes, have this test once every three years after 47 years of age.  If you are overweight and have a high risk for diabetes, consider being tested at a younger age or more often. Preventing infection Hepatitis B  If you have a higher risk for hepatitis B, you should be screened for this virus. You are considered at high risk for hepatitis B if:  You were born in a country where hepatitis B is common. Ask your health care provider which countries are considered high risk.  Your parents were born in a high-risk country, and you have not been immunized against hepatitis B (hepatitis B vaccine).  You have HIV or AIDS.  You use needles to inject street drugs.  You live with someone who has hepatitis B.  You have had sex with someone who has hepatitis B.  You get hemodialysis treatment.  You take certain medicines for conditions, including cancer, organ transplantation, and autoimmune conditions. Hepatitis C  Blood testing is recommended for:  Everyone born from [birth year] through 1966.  Anyone with known risk factors for hepatitis C. Sexually transmitted infections (STIs)  You should be screened for sexually transmitted infections (STIs) including gonorrhea and chlamydia if:  You are sexually active and are younger than 47 years of age.  You are older than 47 years of age and your health care provider tells you that you are at risk for this type of infection.  Your sexual activity has changed since you were last screened and you are at an  increased risk for chlamydia or gonorrhea. Ask your health care provider if you are at risk.  If you do not have HIV, but are at risk, it may be recommended that you take a prescription medicine daily to prevent HIV infection. This is called pre-exposure prophylaxis (PrEP). You are considered at risk if:  You are sexually active and do not regularly use condoms or know the HIV status of your partner(s).  You take drugs by injection.  You are sexually active with  a partner who has HIV. Talk with your health care provider about whether you are at high risk of being infected with HIV. If you choose to begin PrEP, you should first be tested for HIV. You should then be tested every 3 months for as long as you are taking PrEP. Pregnancy  If you are premenopausal and you may become pregnant, ask your health care provider about preconception counseling.  If you may become pregnant, take 400 to 800 micrograms (mcg) of folic acid every day.  If you want to prevent pregnancy, talk to your health care provider about birth control (contraception). Osteoporosis and menopause  Osteoporosis is a disease in which the bones lose minerals and strength with aging. This can result in serious bone fractures. Your risk for osteoporosis can be identified using a bone density scan.  If you are 16 years of age or older, or if you are at risk for osteoporosis and fractures, ask your health care provider if you should be screened.  Ask your health care provider whether you should take a calcium or vitamin D supplement to lower your risk for osteoporosis.  Menopause may have certain physical symptoms and risks.  Hormone replacement therapy may reduce some of these symptoms and risks. Talk to your health care provider about whether hormone replacement therapy is right for you. Follow these instructions at home:  Schedule regular health, dental, and eye exams.  Stay current with your immunizations.  Do not use  any tobacco products including cigarettes, chewing tobacco, or electronic cigarettes.  If you are pregnant, do not drink alcohol.  If you are breastfeeding, limit how much and how often you drink alcohol.  Limit alcohol intake to no more than 1 drink per day for nonpregnant women. One drink equals 12 ounces of beer, 5 ounces of wine, or 1 ounces of hard liquor.  Do not use street drugs.  Do not share needles.  Ask your health care provider for help if you need support or information about quitting drugs.  Tell your health care provider if you often feel depressed.  Tell your health care provider if you have ever been abused or do not feel safe at home. This information is not intended to replace advice given to you by your health care provider. Make sure you discuss any questions you have with your health care provider. Document Released: 03/12/2011 Document Revised: 02/02/2016 Document Reviewed: 05/31/2015  2017 Elsevier  Smoking Hazards Smoking cigarettes is extremely bad for your health. Tobacco smoke has over 200 known poisons in it. It contains the poisonous gases nitrogen oxide and carbon monoxide. There are over 60 chemicals in tobacco smoke that cause cancer. Some of the chemicals found in cigarette smoke include:   Cyanide.   Benzene.   Formaldehyde.   Methanol (wood alcohol).   Acetylene (fuel used in welding torches).   Ammonia.  Even smoking lightly shortens your life expectancy by several years. You can greatly reduce the risk of medical problems for you and your family by stopping now. Smoking is the most preventable cause of death and disease in our society. Within days of quitting smoking, your circulation improves, you decrease the risk of having a heart attack, and your lung capacity improves. There may be some increased phlegm in the first few days after quitting, and it may take months for your lungs to clear up completely. Quitting for 10 years reduces  your risk of developing lung cancer to almost that of a nonsmoker.  WHAT ARE THE RISKS OF SMOKING? Cigarette smokers have an increased risk of many serious medical problems, including:  Lung cancer.   Lung disease (such as pneumonia, bronchitis, and emphysema).   Heart attack and chest pain due to the heart not getting enough oxygen (angina).   Heart disease and peripheral blood vessel disease.   Hypertension.   Stroke.   Oral cancer (cancer of the lip, mouth, or voice box).   Bladder cancer.   Pancreatic cancer.   Cervical cancer.   Pregnancy complications, including premature birth.   Stillbirths and smaller newborn babies, birth defects, and genetic damage to sperm.   Early menopause.   Lower estrogen level for women.   Infertility.   Facial wrinkles.   Blindness.   Increased risk of broken bones (fractures).   Senile dementia.   Stomach ulcers and internal bleeding.   Delayed wound healing and increased risk of complications during surgery. Because of secondhand smoke exposure, children of smokers have an increased risk of the following:   Sudden infant death syndrome (SIDS).   Respiratory infections.   Lung cancer.   Heart disease.   Ear infections.  WHY IS SMOKING ADDICTIVE? Nicotine is the chemical agent in tobacco that is capable of causing addiction or dependence. When you smoke and inhale, nicotine is absorbed rapidly into the bloodstream through your lungs. Both inhaled and noninhaled nicotine may be addictive.  WHAT ARE THE BENEFITS OF QUITTING?  There are many health benefits to quitting smoking. Some are:   The likelihood of developing cancer and heart disease decreases. Health improvements are seen almost immediately.   Blood pressure, pulse rate, and breathing patterns start returning to normal soon after quitting.   People who quit may see an improvement in their overall quality of life.  HOW DO YOU QUIT  SMOKING? Smoking is an addiction with both physical and psychological effects, and longtime habits can be hard to change. Your health care provider can recommend:  Programs and community resources, which may include group support, education, or therapy.  Replacement products, such as patches, gum, and nasal sprays. Use these products only as directed. Do not replace cigarette smoking with electronic cigarettes (commonly called e-cigarettes). The safety of e-cigarettes is unknown, and some may contain harmful chemicals. FOR MORE INFORMATION  American Lung Association: www.lung.org  American Cancer Society: www.cancer.org This information is not intended to replace advice given to you by your health care provider. Make sure you discuss any questions you have with your health care provider. Document Released: 10/04/2004 Document Revised: 12/19/2015 Document Reviewed: 02/16/2013 Elsevier Interactive Patient Education  2017 [employer].

## 2016-10-05 NOTE — Assessment & Plan Note (Signed)
Patient is not ready to quit; I am here to help if/when ready 

## 2016-10-06 LAB — LIPID PANEL
CHOLESTEROL: 255 mg/dL — AB (ref <200)
HDL: 37 mg/dL — ABNORMAL LOW (ref >50)
Total CHOL/HDL Ratio: 6.9 Ratio — ABNORMAL HIGH (ref <5.0)
Triglycerides: 511 mg/dL — ABNORMAL HIGH (ref <150)

## 2016-10-06 LAB — URINALYSIS W MICROSCOPIC + REFLEX CULTURE
BACTERIA UA: NONE SEEN [HPF]
BILIRUBIN URINE: NEGATIVE
CRYSTALS: NONE SEEN [HPF]
Casts: NONE SEEN [LPF]
Glucose, UA: NEGATIVE
KETONES UR: NEGATIVE
Leukocytes, UA: NEGATIVE
Nitrite: NEGATIVE
PROTEIN: NEGATIVE
SPECIFIC GRAVITY, URINE: 1.006 (ref 1.001–1.035)
SQUAMOUS EPITHELIAL / LPF: NONE SEEN [HPF] (ref <=5)
WBC UA: NONE SEEN WBC/HPF (ref <=5)
Yeast: NONE SEEN [HPF]
pH: 6.5 (ref 5.0–8.0)

## 2016-10-06 LAB — COMPLETE METABOLIC PANEL WITH GFR
ALT: 29 U/L (ref 6–29)
AST: 28 U/L (ref 10–35)
Albumin: 4.3 g/dL (ref 3.6–5.1)
Alkaline Phosphatase: 82 U/L (ref 33–115)
BILIRUBIN TOTAL: 0.3 mg/dL (ref 0.2–1.2)
BUN: 11 mg/dL (ref 7–25)
CO2: 27 mmol/L (ref 20–31)
CREATININE: 0.72 mg/dL (ref 0.50–1.10)
Calcium: 10.1 mg/dL (ref 8.6–10.2)
Chloride: 101 mmol/L (ref 98–110)
GFR, Est African American: >89 mL/min (ref >=60)
GFR, Est Non African American: >89 mL/min (ref >=60)
GLUCOSE: 87 mg/dL (ref 65–99)
Potassium: 5.3 mmol/L (ref 3.5–5.3)
SODIUM: 138 mmol/L (ref 135–146)
TOTAL PROTEIN: 7.1 g/dL (ref 6.1–8.1)

## 2016-10-06 LAB — VITAMIN D 25 HYDROXY (VIT D DEFICIENCY, FRACTURES): Vit D, 25-Hydroxy: 19 ng/mL — ABNORMAL LOW (ref 30–100)

## 2016-10-08 ENCOUNTER — Other Ambulatory Visit: Payer: Self-pay

## 2016-10-08 DIAGNOSIS — Z1211 Encounter for screening for malignant neoplasm of colon: Secondary | ICD-10-CM

## 2016-10-08 LAB — POC HEMOCCULT BLD/STL (HOME/3-CARD/SCREEN)
Card #2 Fecal Occult Blod, POC: POSITIVE
Card #3 Fecal Occult Blood, POC: NEGATIVE
Fecal Occult Blood, POC: NEGATIVE

## 2016-10-11 ENCOUNTER — Other Ambulatory Visit: Payer: Self-pay | Admitting: Family Medicine

## 2016-10-11 DIAGNOSIS — E781 Pure hyperglyceridemia: Secondary | ICD-10-CM | POA: Insufficient documentation

## 2016-10-11 DIAGNOSIS — E559 Vitamin D deficiency, unspecified: Secondary | ICD-10-CM

## 2016-10-11 DIAGNOSIS — R718 Other abnormality of red blood cells: Secondary | ICD-10-CM

## 2016-10-11 MED ORDER — OMEGA-3-ACID ETHYL ESTERS 1 G PO CAPS: 2.0000 g | ORAL_CAPSULE | Freq: Two times a day (BID) | ORAL | 5 refills | Status: DC

## 2016-10-11 MED ORDER — VITAMIN D (ERGOCALCIFEROL) 1.25 MG (50000 UNIT) PO CAPS: 50000.0000 [IU] | ORAL_CAPSULE | ORAL | 1 refills | Status: DC

## 2016-10-11 NOTE — Assessment & Plan Note (Signed)
May be secondary to smoking; discuss with patient at appt; evaluate for OSA, consider JAK-2 testing

## 2016-10-11 NOTE — Progress Notes (Signed)
Need to see patient after CT scan, will go over scan and several abnormal lab results; note to staff; new medicines sent to pharmacy

## 2016-10-11 NOTE — Assessment & Plan Note (Signed)
Start Rx for 8 weeks, then 1,000 iu daily

## 2016-10-11 NOTE — Assessment & Plan Note (Signed)
Start lovaza; recheck labs in 3 months (early May 2018)

## 2016-10-15 ENCOUNTER — Ambulatory Visit
Admission: RE | Admit: 2016-10-15 | Discharge: 2016-10-15 | Disposition: A | Discharge status: Home / Self Care | Payer: 59 | Source: Ambulatory Visit | Attending: Family Medicine | Admitting: Family Medicine

## 2016-10-15 DIAGNOSIS — R1031 Right lower quadrant pain: Secondary | ICD-10-CM | POA: Insufficient documentation

## 2016-10-15 DIAGNOSIS — R109 Unspecified abdominal pain: Secondary | ICD-10-CM | POA: Diagnosis not present

## 2016-10-15 IMAGING — CT CT ABD-PELV W/ CM
1 of 3 series · 14 of 32 positions shown, 19 images · IV contrast (APPLIED)
Comparison: CT scan of [DATE].

CLINICAL DATA: Right lower quadrant abdominal pain for 6 weeks.

EXAM:
CT ABDOMEN AND PELVIS WITH CONTRAST
TECHNIQUE: Multidetector CT imaging of the abdomen and pelvis was performed
using the standard protocol following bolus administration of
intravenous contrast.
CONTRAST:  100mL [8U] IOPAMIDOL ([8U]) INJECTION 61%

[Series 2: axial st · axial · 0.70mm/px · z∈[-958,-568]mm · 14 of 88 slices shown, 19 images]
[im 5/88  soft-tissue]
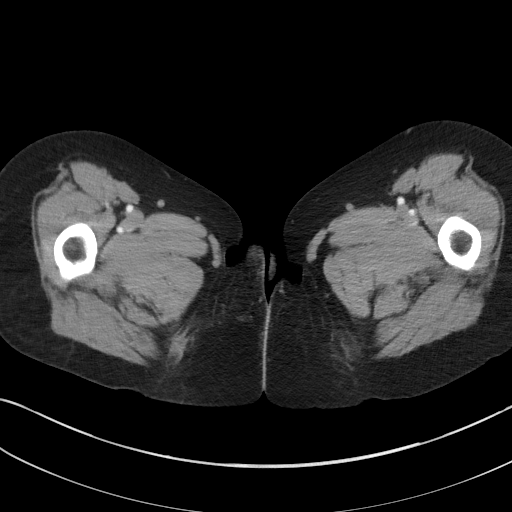
[im 5/88  bone]
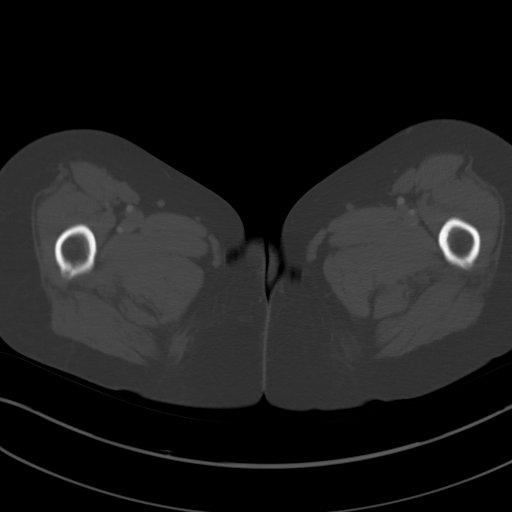
[im 14/88  soft-tissue]
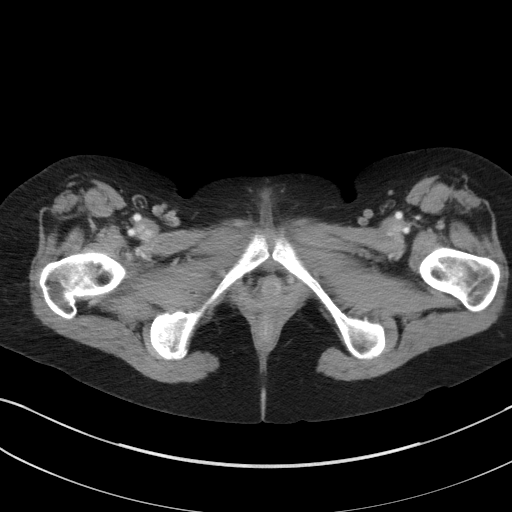
[im 19/88  soft-tissue]
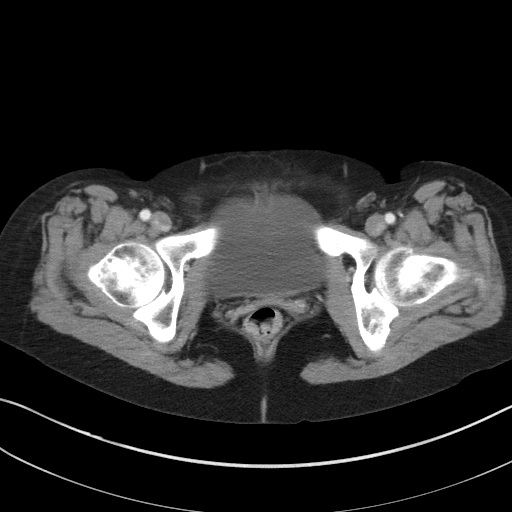
[im 23/88  soft-tissue]
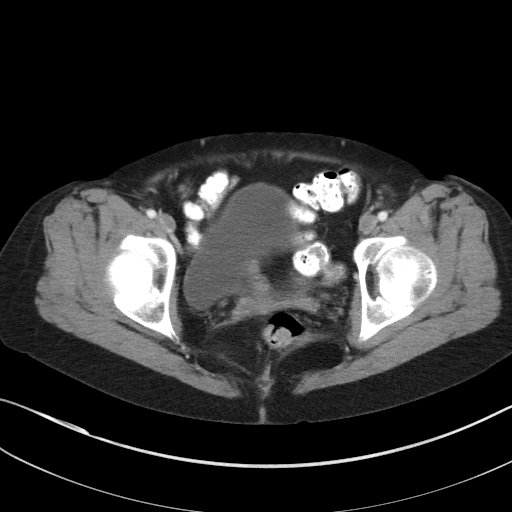
[im 33/88  soft-tissue]
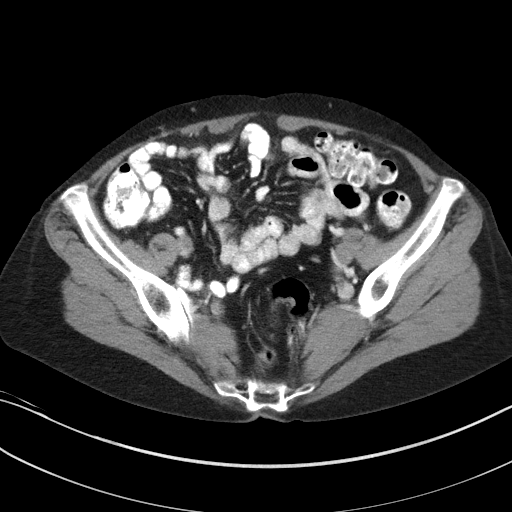
[im 37/88  soft-tissue]
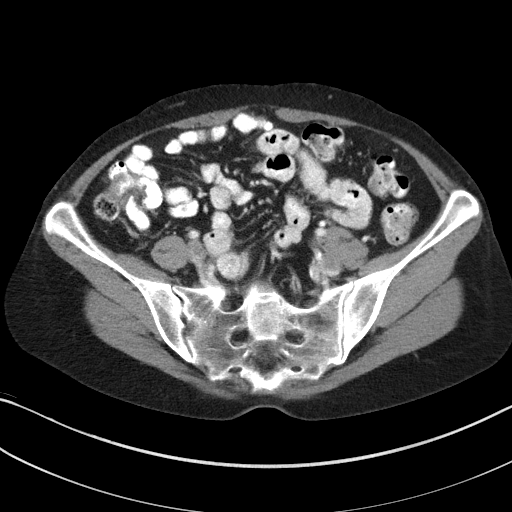
[im 46/88  soft-tissue]
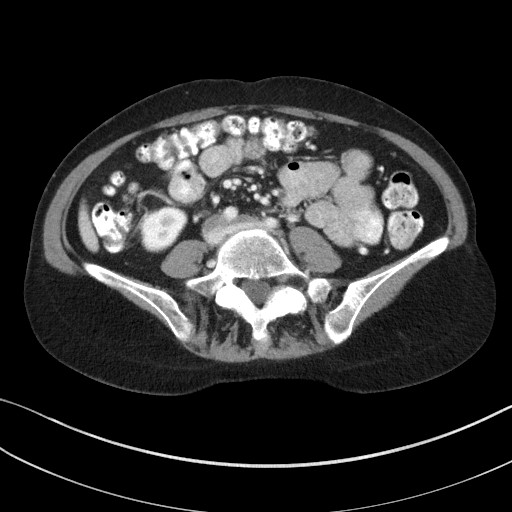
[im 51/88  soft-tissue]
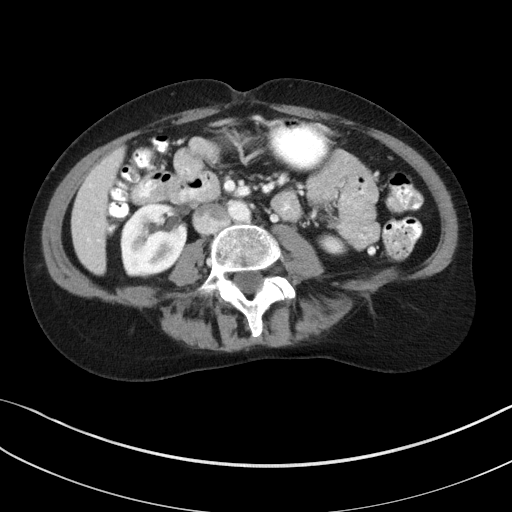
[im 55/88  soft-tissue]
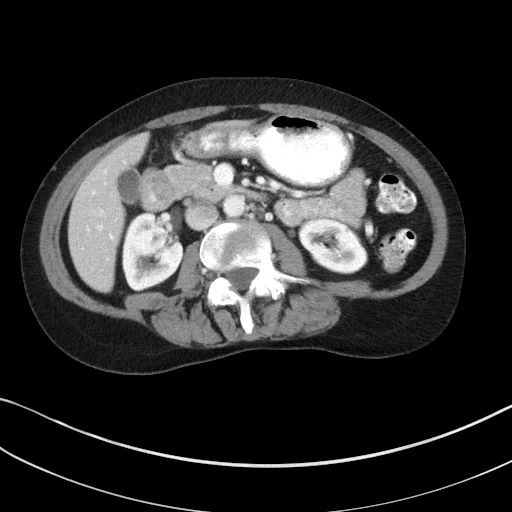
[im 55/88  bone]
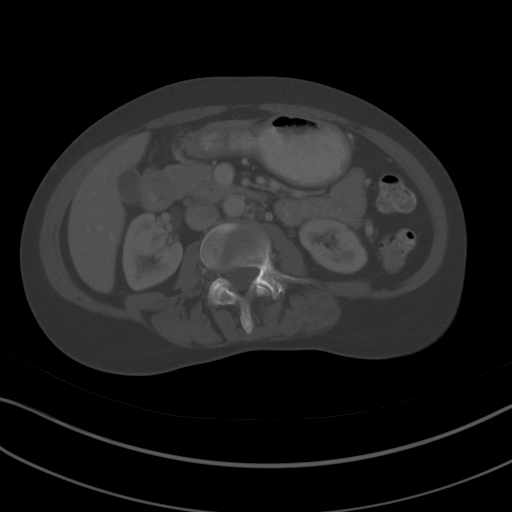
[im 65/88  soft-tissue]
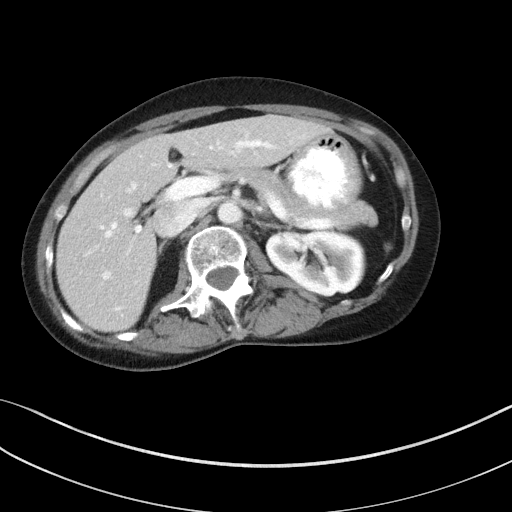
[im 69/88  soft-tissue]
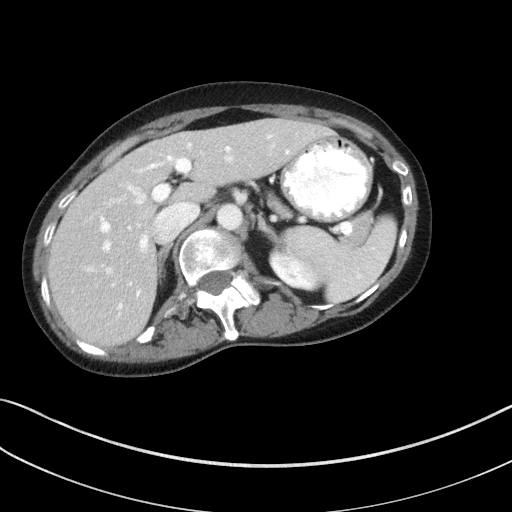
[im 69/88  lung]
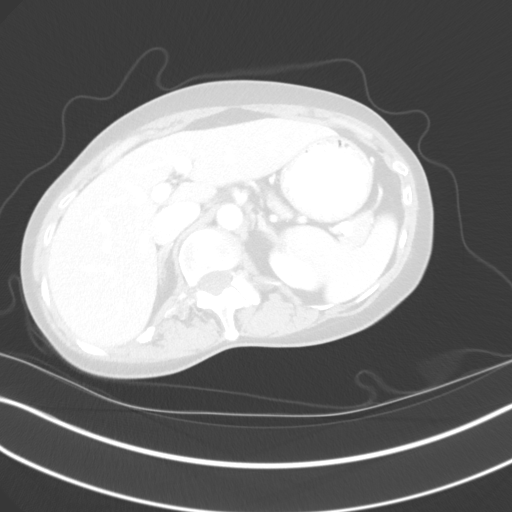
[im 74/88  soft-tissue]
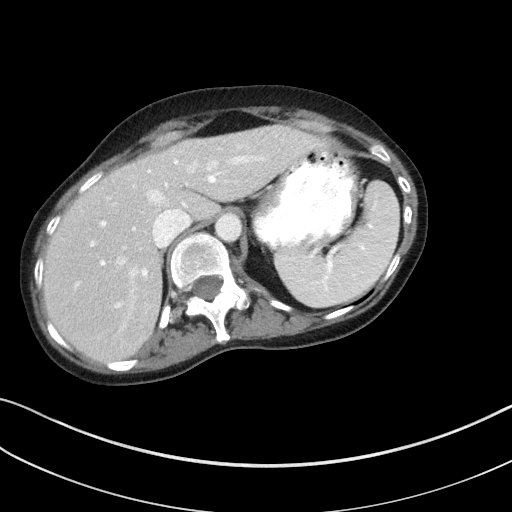
[im 74/88  lung]
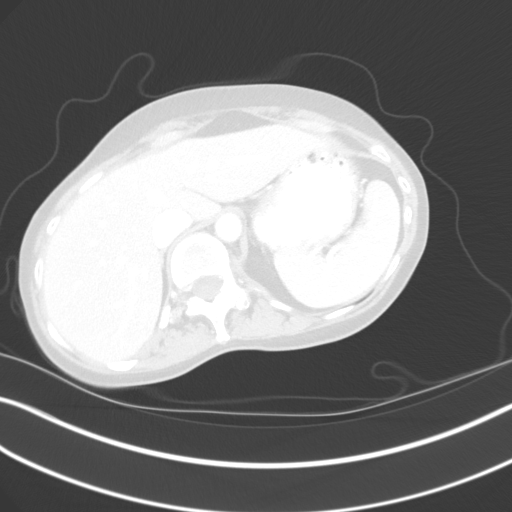
[im 78/88  lung]
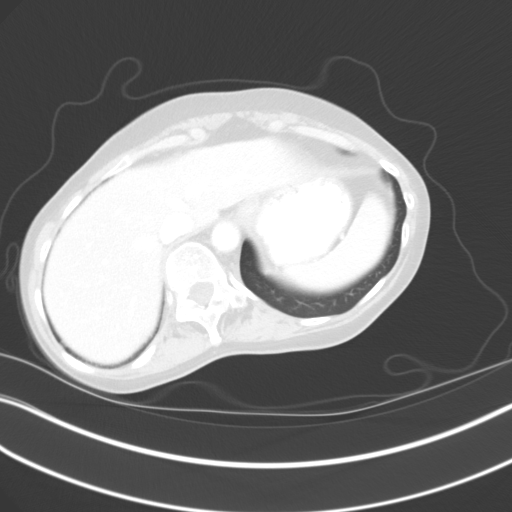
[im 83/88  soft-tissue]
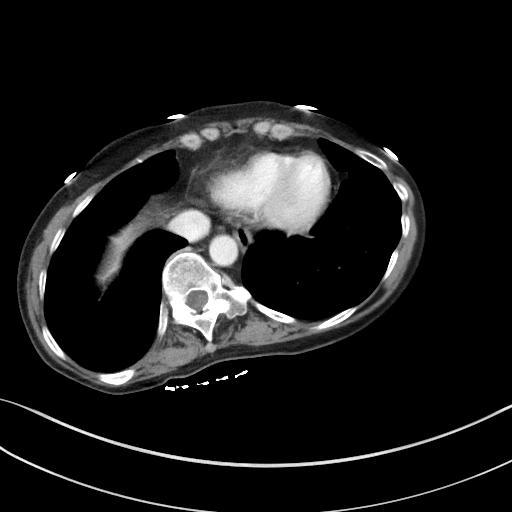
[im 83/88  lung]
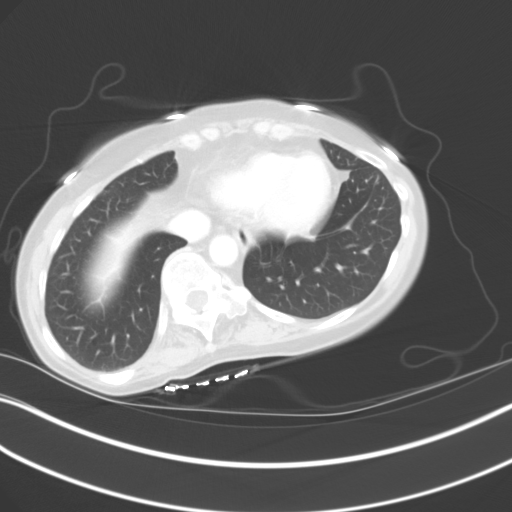

[14 of 32 positions shown; findings below may reference images not displayed]

FINDINGS: Lower chest: No acute abnormality.

Hepatobiliary: No focal liver abnormality is seen. No gallstones,
gallbladder wall thickening, or biliary dilatation.

Pancreas: Unremarkable. No pancreatic ductal dilatation or
surrounding inflammatory changes.

Spleen: Normal in size without focal abnormality.

Adrenals/Urinary Tract: Adrenal glands are unremarkable. Kidneys are
normal, without renal calculi, focal lesion, or hydronephrosis.
Bladder is unremarkable.

Stomach/Bowel: Stomach is within normal limits. Appendix appears
normal. No evidence of bowel wall thickening, distention, or
inflammatory changes.

Vascular/Lymphatic: No significant vascular findings are present. No
enlarged abdominal or pelvic lymph nodes.

Reproductive: Status post hysterectomy. No adnexal masses.

Other: No abdominal wall hernia or abnormality. No abdominopelvic
ascites.

Musculoskeletal: No acute or significant osseous findings.
IMPRESSION: No acute abnormality seen in the abdomen or pelvis.

## 2016-10-15 MED ORDER — IOPAMIDOL (ISOVUE-300) INJECTION 61%
100.0000 mL | Freq: Once | INTRAVENOUS | Status: AC | PRN
  Administered 2016-10-15: 100 mL via INTRAVENOUS

## 2016-10-17 ENCOUNTER — Ambulatory Visit (INDEPENDENT_AMBULATORY_CARE_PROVIDER_SITE_OTHER): Payer: 59 | Admitting: Family Medicine

## 2016-10-17 ENCOUNTER — Encounter: Payer: Self-pay | Admitting: Family Medicine

## 2016-10-17 VITALS — BP 122/68 | HR 96 | Temp 97.5°F | Resp 16 | Wt 123.0 lb

## 2016-10-17 DIAGNOSIS — R1031 Right lower quadrant pain: Secondary | ICD-10-CM | POA: Diagnosis not present

## 2016-10-17 DIAGNOSIS — Z720 Tobacco use: Secondary | ICD-10-CM

## 2016-10-17 DIAGNOSIS — R718 Other abnormality of red blood cells: Secondary | ICD-10-CM | POA: Diagnosis not present

## 2016-10-17 DIAGNOSIS — R195 Other fecal abnormalities: Secondary | ICD-10-CM

## 2016-10-17 DIAGNOSIS — R1012 Left upper quadrant pain: Secondary | ICD-10-CM | POA: Diagnosis not present

## 2016-10-17 DIAGNOSIS — E781 Pure hyperglyceridemia: Secondary | ICD-10-CM

## 2016-10-17 DIAGNOSIS — E559 Vitamin D deficiency, unspecified: Secondary | ICD-10-CM | POA: Diagnosis not present

## 2016-10-17 DIAGNOSIS — R0683 Snoring: Secondary | ICD-10-CM | POA: Diagnosis not present

## 2016-10-17 DIAGNOSIS — E785 Hyperlipidemia, unspecified: Secondary | ICD-10-CM | POA: Diagnosis not present

## 2016-10-17 MED ORDER — VITAMIN D (ERGOCALCIFEROL) 1.25 MG (50000 UNIT) PO CAPS: 50000.0000 [IU] | ORAL_CAPSULE | ORAL | 1 refills | Status: DC

## 2016-10-17 NOTE — Assessment & Plan Note (Signed)
Unusual reading for her; will recheck

## 2016-10-17 NOTE — Patient Instructions (Signed)
We'll get labs today We'll refer you to some specialists If you have not heard anything from my staff in a week about any orders/referrals/studies from today, please contact us here to follow-up [phone]

## 2016-10-17 NOTE — Assessment & Plan Note (Signed)
ddx includes OSA, CO effect from smoking, other (consider heme etiology); recheck CBC, peripheral smear for pathologist to review; check CO level; urged patient to quit smoking

## 2016-10-17 NOTE — Assessment & Plan Note (Signed)
Refer to Dr. Burt Knack to consider colonoscopy

## 2016-10-17 NOTE — Assessment & Plan Note (Signed)
Unusual, recheck

## 2016-10-17 NOTE — Assessment & Plan Note (Addendum)
Flank pain on the left; blood in the urine on dip, but negative micro; however, patient smokes; reviewed CT scan, would be more comfortable with urologist considering work-up; explained smoking increases risk of cancer

## 2016-10-17 NOTE — Assessment & Plan Note (Signed)
With heme positive stool, refer to GI 

## 2016-10-17 NOTE — Progress Notes (Signed)
BP 122/68   Pulse 96   Temp 97.5 F (36.4 C) (Oral)   Resp 16   Wt 123 lb (55.8 kg)   SpO2 94%   BMI 20.34 kg/m    Subjective:    Patient ID: Kathryn Fuller, female    DOB: 1970-10-28, 46 y.o.   MRN: [id number]  HPI: Kathryn Fuller is a 46 y.o. female  Chief Complaint  Patient presents with  . Imaging Review    CT    Here for follow-up of pain, and to go over labs and and CT scan  Left flank pain is a stabbing even just sitting there; nothing alleviates; left flank going on for one month; feels like a tightening up; like a stabbing; each stabbing is about five minutes; the pain is pretty bothersome  Right side pelvic pain; stabbing and burning pain; goes from hot to cold; feels like it's dripping from an IV but deep inside  Positive stool card (one out of three); has never seen blood in the stool; not any darker; will turn green at times; does not correlate with any food  Heme positive urine on the dipstick, but 0-2 RBCs on the micro  Vitamin D was only 19; not much outside time; was outdoors in [place] last summer, otherwise, not much sun exposure; does drink milk; energy level not as good as expected  TG 511; never that high in the past; no fam hx, wonder if wrong tube  She continues to smoke; has tried med in the past that made her want to crawl out of her skin  Depression screen [facility] 2/9 10/17/2016 10/05/2016 08/09/2015  Decreased Interest 0 0 0  Down, Depressed, Hopeless 0 0 0  PHQ - 2 Score 0 0 0   Relevant past medical, surgical, family and social history reviewed Past Medical History:  Diagnosis Date  . Diverticulitis   . Diverticulosis of colon 10/16   Hospitalized   . Endometriosis   . Pneumonia    Past Surgical History:  Procedure Laterality Date  . ABDOMINAL HYSTERECTOMY     TAH-BSO  . INGUINAL HERNIA REPAIR     Family History  Problem Relation Age of Onset  . Diabetes Mother   . Dementia Mother   . Heart disease Father   . Dementia  Father   . Heart disease Maternal Uncle   . Heart attack Maternal Uncle   . Heart disease Maternal Grandmother   . Heart attack Maternal Grandmother   . Breast cancer Neg Hx   . Cancer Neg Hx   . COPD Neg Hx   . Stroke Neg Hx   MD note: negative for colon cancer, inflammatory bowel disease, diverticular disease  Social History  Substance Use Topics  . Smoking status: Current Every Day Smoker    Packs/day: 1.00    Years: 25.00    Types: Cigarettes  . Smokeless tobacco: Never Used  . Alcohol use No   Interim medical history since last visit reviewed. Allergies and medications reviewed  Review of Systems Per HPI unless specifically indicated above     Objective:    BP 122/68   Pulse 96   Temp 97.5 F (36.4 C) (Oral)   Resp 16   Wt 123 lb (55.8 kg)   SpO2 94%   BMI 20.34 kg/m   Wt Readings from Last 3 Encounters:  10/17/16 123 lb (55.8 kg)  10/05/16 121 lb 12.8 oz (55.2 kg)  07/26/16 122 lb (55.3 kg)  Physical Exam  Constitutional: She appears well-developed and well-nourished. No distress.  Eyes: EOM are normal. No scleral icterus.  Neck: No thyromegaly present.  Cardiovascular: Normal rate.   Pulmonary/Chest: Effort normal.  Abdominal: She exhibits no distension.  Skin: No pallor.  Appears older than stated age, wrinkled consistent with years of cigarette smoking  Psychiatric: She has a normal mood and affect. Her behavior is normal. Judgment and thought content normal.    Results for orders placed or performed in visit on 10/08/16  POC Hemoccult Bld/Stl (3-Cd Home Screen)  Result Value Ref Range   Card #1 Date 10/05/16    Fecal Occult Blood, POC Negative Negative   Card #2 Date 10/06/16    Card #2 Fecal Occult Blod, POC Positive    Card #3 Date 10/07/16    Card #3 Fecal Occult Blood, POC Negative       Assessment & Plan:   Problem List Items Addressed This Visit      Other   Vitamin D deficiency    Start Rx and then OTC      Tobacco abuse     Urged patient to quit      Relevant Orders   Ambulatory referral to Pulmonology   Ambulatory referral to Urology   Right lower quadrant pain    With heme positive stool, refer to GI      Relevant Orders   Ambulatory referral to General Surgery   Ambulatory referral to Urology   LUQ pain    Flank pain on the left; blood in the urine on dip, but negative micro; however, patient smokes; reviewed CT scan, would be more comfortable with urologist considering work-up; explained smoking increases risk of cancer      Relevant Orders   Ambulatory referral to General Surgery   Ambulatory referral to Urology   Hypertriglyceridemia    Unusual, recheck      Relevant Orders   Lipid panel   Heme positive stool    Refer to Dr. Burt Knack to consider colonoscopy      Relevant Orders   Ambulatory referral to General Surgery   Elevated hematocrit - Primary    ddx includes OSA, CO effect from smoking, other (consider heme etiology); recheck CBC, peripheral smear for pathologist to review; check CO level; urged patient to quit smoking      Relevant Orders   CO2, total   CBC with Differential/Platelet   Pathologist smear review   Ambulatory referral to Pulmonology   Dyslipidemia    Unusual reading for her; will recheck      Relevant Orders   Lipid panel    Other Visit Diagnoses    Snores           Follow up plan: No Follow-up on file.  An after-visit summary was printed and given to the patient at [facility].  Please see the patient instructions which may contain other information and recommendations beyond what is mentioned above in the assessment and plan.  Meds ordered this encounter  Medications  . Vitamin D, Ergocalciferol, (DRISDOL) 50000 units CAPS capsule    Sig: Take 1 capsule (50,000 Units total) by mouth every 7 (seven) days.    Dispense:  4 capsule    Refill:  1    Orders Placed This Encounter  Procedures  . CO2, total  . Lipid panel  . CBC with  Differential/Platelet  . Pathologist smear review  . Ambulatory referral to Pulmonology  . Ambulatory referral to General Surgery  .  Ambulatory referral to Urology    Face-to-face time with patient was more than 25 minutes, >50% time spent counseling and coordination of care

## 2016-10-17 NOTE — Assessment & Plan Note (Signed)
Urged patient to quit

## 2016-10-17 NOTE — Assessment & Plan Note (Signed)
Start Rx and then OTC

## 2016-10-18 LAB — CBC WITH DIFFERENTIAL/PLATELET
BASOS ABS: 68 {cells}/uL (ref 0–200)
BASOS PCT: 1 %
EOS ABS: 136 {cells}/uL (ref 15–500)
Eosinophils Relative: 2 %
HEMATOCRIT: 45 % (ref 35.0–45.0)
HEMOGLOBIN: 14.9 g/dL (ref 11.7–15.5)
LYMPHS ABS: 2652 {cells}/uL (ref 850–3900)
Lymphocytes Relative: 39 %
MCH: 30.5 pg (ref 27.0–33.0)
MCHC: 33.1 g/dL (ref 32.0–36.0)
MCV: 92.2 fL (ref 80.0–100.0)
MONO ABS: 544 {cells}/uL (ref 200–950)
MPV: 9.6 fL (ref 7.5–12.5)
Monocytes Relative: 8 %
NEUTROS ABS: 3400 {cells}/uL (ref 1500–7800)
Neutrophils Relative %: 50 %
PLATELETS: 325 10*3/uL (ref 140–400)
RBC: 4.88 MIL/uL (ref 3.80–5.10)
RDW: 13.4 % (ref 11.0–15.0)
WBC: 6.8 10*3/uL (ref 3.8–10.8)

## 2016-10-18 LAB — LIPID PANEL
CHOLESTEROL: 242 mg/dL — AB (ref <200)
HDL: 34 mg/dL — ABNORMAL LOW (ref >50)
LDL Cholesterol: 149 mg/dL — ABNORMAL HIGH (ref <100)
TRIGLYCERIDES: 294 mg/dL — AB (ref <150)
Total CHOL/HDL Ratio: 7.1 Ratio — ABNORMAL HIGH (ref <5.0)
VLDL: 59 mg/dL — AB (ref <30)

## 2016-10-18 LAB — CO2, TOTAL: CO2: 29 mmol/L (ref 20–31)

## 2016-10-18 LAB — PATHOLOGIST SMEAR REVIEW

## 2016-10-21 ENCOUNTER — Encounter: Payer: Self-pay | Admitting: Family Medicine

## 2016-10-23 ENCOUNTER — Institutional Professional Consult (permissible substitution): Payer: 59 | Admitting: Internal Medicine

## 2016-10-31 ENCOUNTER — Ambulatory Visit (INDEPENDENT_AMBULATORY_CARE_PROVIDER_SITE_OTHER): Payer: 59 | Admitting: Urology

## 2016-10-31 ENCOUNTER — Encounter: Payer: Self-pay | Admitting: Urology

## 2016-10-31 VITALS — BP 108/72 | HR 71 | Ht 65.0 in | Wt 125.1 lb

## 2016-10-31 DIAGNOSIS — R1031 Right lower quadrant pain: Secondary | ICD-10-CM | POA: Diagnosis not present

## 2016-10-31 DIAGNOSIS — R3129 Other microscopic hematuria: Secondary | ICD-10-CM | POA: Diagnosis not present

## 2016-10-31 DIAGNOSIS — R109 Unspecified abdominal pain: Secondary | ICD-10-CM

## 2016-10-31 LAB — MICROSCOPIC EXAMINATION
Bacteria, UA: NONE SEEN
WBC, UA: NONE SEEN /hpf (ref 0–?)

## 2016-10-31 LAB — URINALYSIS, COMPLETE
BILIRUBIN UA: NEGATIVE
Glucose, UA: NEGATIVE
KETONES UA: NEGATIVE
LEUKOCYTES UA: NEGATIVE
Nitrite, UA: NEGATIVE
Protein, UA: NEGATIVE
SPEC GRAV UA: 1.01 (ref 1.005–1.030)
Urobilinogen, Ur: 0.2 mg/dL (ref 0.2–1.0)
pH, UA: 5.5 (ref 5.0–7.5)

## 2016-10-31 LAB — BLADDER SCAN AMB NON-IMAGING: SCAN RESULT: 39

## 2016-10-31 NOTE — Progress Notes (Signed)
10/31/2016 2:34 PM   Kathryn Fuller [birth year] [id number]  Referring provider: Arnetha Courser, MD [address] [place], [place] [address]  Chief Complaint  Patient presents with  . New Patient (Initial Visit)    RLQ pain    HPI: Patient is a 46 year old Caucasian female who is referred to [place] by Dr. Sanda Klein for left flank pain and right lower quadrant pain who is a current smoker for further evaluation.  Patient has been experiencing intermittent frequency, intermittent urgency, nocturia and intermittent leakage.  Her UA today demonstrates 3-10 RBC's.  Her PVR was 39 mL.  She has been having left flank pain for one month and right lower quadrant pain for two months.  Pain reaches 10/10.  Lasting several minutes.  Nothing makes the pain worse or better.    Patient underwent a contrast CT on 10/15/2016 which found no acute abnormality seen in the abdomen or pelvis.  I have independently reviewed the films.    She does not have a prior history of recurrent urinary tract infections, nephrolithiasis, trauma to the genitourinary tract, or malignancies of the genitourinary tract.   She does not have a family history of nephrolithiasis, GU malignancies or hematuria.    She is a smoker.   She has not worked with Sports [employer].    PMH: Past Medical History:  Diagnosis Date  . Diverticulitis   . Diverticulosis of colon 10/16   Hospitalized   . Endometriosis   . Pneumonia     Surgical History: Past Surgical History:  Procedure Laterality Date  . ABDOMINAL HYSTERECTOMY     TAH-BSO  . INGUINAL HERNIA REPAIR      Home Medications:  Allergies as of 10/31/2016   No Known Allergies     Medication List       Accurate as of 10/31/16  2:34 PM. Always use your most recent med list.          Vitamin D (Ergocalciferol) 50000 units Caps capsule Commonly known as:  DRISDOL Take 1 capsule (50,000 Units total) by mouth every 7 (seven) days.       Allergies:  No Known Allergies  Family History: Family History  Problem Relation Age of Onset  . Diabetes Mother   . Dementia Mother   . Heart disease Father   . Dementia Father   . Heart disease Maternal Uncle   . Heart attack Maternal Uncle   . Heart disease Maternal Grandmother   . Heart attack Maternal Grandmother   . Breast cancer Neg Hx   . Cancer Neg Hx   . COPD Neg Hx   . Stroke Neg Hx     Social History:  reports that she has been smoking Cigarettes.  She has a 25.00 pack-year smoking history. She has never used smokeless tobacco. She reports that she does not drink alcohol or use drugs.  ROS: UROLOGY Frequent Urination?: Yes Hard to postpone urination?: Yes Burning/pain with urination?: No Get up at night to urinate?: Yes Leakage of urine?: Yes Urine stream starts and stops?: No Trouble starting stream?: No Do you have to strain to urinate?: No Blood in urine?: Yes Urinary tract infection?: No Sexually transmitted disease?: No Injury to kidneys or bladder?: No Painful intercourse?: No Weak stream?: No Currently pregnant?: No Vaginal bleeding?: No Last menstrual period?: n  Gastrointestinal Nausea?: No Vomiting?: No Indigestion/heartburn?: No Diarrhea?: No Constipation?: No  Constitutional Fever: No Night sweats?: No Weight loss?: No Fatigue?: No  Skin Skin rash/lesions?: No Itching?: No  Eyes Blurred vision?: No Double vision?: No  Ears/Nose/Throat Sore throat?: No Sinus problems?: No  Hematologic/Lymphatic Swollen glands?: No Easy bruising?: Yes  Cardiovascular Leg swelling?: No Chest pain?: No  Respiratory Cough?: No Shortness of breath?: No  Endocrine Excessive thirst?: No  Musculoskeletal Back pain?: Yes Joint pain?: No  Neurological Headaches?: No Dizziness?: No  Psychologic Depression?: No Anxiety?: No  Physical Exam: BP 108/72   Pulse 71   Ht 5\' 5"  (1.651 m)   Wt 125 lb 1.6 oz (56.7 kg)   BMI 20.82 kg/m     Constitutional: Well nourished. Alert and oriented, No acute distress. HEENT: [place] AT, moist mucus membranes. Trachea midline, no masses. Cardiovascular: No clubbing, cyanosis, or edema. Respiratory: Normal respiratory effort, no increased work of breathing. GI: Abdomen is soft, non tender, non distended, no abdominal masses. Liver and spleen not palpable.  No hernias appreciated.  Stool sample for occult testing is not indicated.   GU: No CVA tenderness.  No bladder fullness or masses.   Skin: No rashes, bruises or suspicious lesions. Lymph: No cervical or inguinal adenopathy. Neurologic: Grossly intact, no focal deficits, moving all 4 extremities. Psychiatric: Normal mood and affect.  Laboratory Data: Lab Results  Component Value Date   WBC 6.8 10/17/2016   HGB 14.9 10/17/2016   HCT 45.0 10/17/2016   MCV 92.2 10/17/2016   PLT 325 10/17/2016    Lab Results  Component Value Date   CREATININE 0.72 10/05/2016       Component Value Date/Time   CHOL 242 (H) 10/17/2016 1550   CHOL 237 (H) 03/25/2015 1052   HDL 34 (L) 10/17/2016 1550   HDL 45 03/25/2015 1052   CHOLHDL 7.1 (H) 10/17/2016 1550   VLDL 59 (H) 10/17/2016 1550   LDLCALC 149 (H) 10/17/2016 1550   LDLCALC 168 (H) 03/25/2015 1052    Lab Results  Component Value Date   AST 28 10/05/2016   Lab Results  Component Value Date   ALT 29 10/05/2016     Urinalysis 3-10 RBC's.  See EPIC  Pertinent Imaging: CLINICAL DATA:  Right lower quadrant abdominal pain for 6 weeks.  EXAM: CT ABDOMEN AND PELVIS WITH CONTRAST  TECHNIQUE: Multidetector CT imaging of the abdomen and pelvis was performed using the standard protocol following bolus administration of intravenous contrast.  CONTRAST:  144mL ISOVUE-300 IOPAMIDOL (ISOVUE-300) INJECTION 61%  COMPARISON:  CT scan of June 16, 2015.  FINDINGS: Lower chest: No acute abnormality.  Hepatobiliary: No focal liver abnormality is seen. No  gallstones, gallbladder wall thickening, or biliary dilatation.  Pancreas: Unremarkable. No pancreatic ductal dilatation or surrounding inflammatory changes.  Spleen: Normal in size without focal abnormality.  Adrenals/Urinary Tract: Adrenal glands are unremarkable. Kidneys are normal, without renal calculi, focal lesion, or hydronephrosis. Bladder is unremarkable.  Stomach/Bowel: Stomach is within normal limits. Appendix appears normal. No evidence of bowel wall thickening, distention, or inflammatory changes.  Vascular/Lymphatic: No significant vascular findings are present. No enlarged abdominal or pelvic lymph nodes.  Reproductive: Status post hysterectomy. No adnexal masses.  Other: No abdominal wall hernia or abnormality. No abdominopelvic ascites.  Musculoskeletal: No acute or significant osseous findings.  IMPRESSION: No acute abnormality seen in the abdomen or pelvis.   Electronically Signed   By: Marijo Conception, M.D.   On: 10/15/2016 11:39  Assessment & Plan:    1. Microscopi hematuria  - I explained to the patient that there are a number of causes that can  be associated with blood in the urine, such as stones, UTI's, damage to the urinary tract and/or cancer.  - At this time, I felt that the patient warranted further urologic evaluation.   The AUA guidelines state that a CT urogram is the preferred imaging study to evaluate hematuria - explained to the patient that the contrast CT may miss some urological tumors and/or stones - she is wanting to undergo a CT Urogram at this time  - I explained to the patient that a contrast material will be injected into a vein and that in rare instances, an allergic reaction can result and may even life threatening   The patient denies any allergies to contrast, iodine and/or seafood and is not taking metformin.  - Her reproductive status is hysterectomy  - Following the imaging study,  I've recommended a cystoscopy. I  described how this is performed, typically in an office setting with a flexible cystoscope. We described the risks, benefits, and possible side effects, the most common of which is a minor amount of blood in the urine and/or burning which usually resolves in 24 to 48 hours.    - The patient had the opportunity to ask questions which were answered. Based upon this discussion, the patient is willing to proceed. Therefore, I've ordered: a CT Urogram and cystoscopy.  - The patient will return following all of the above for discussion of the results.   - UA  - Urine culture  - BUN + creatinine    - I explained to the patient that we may not find a reason for her pain with the CT Urogram and cystoscopy as her pain may not be urological in nature  2. Left flank pain  - obtaining a CT Urogram   3. Right quadrant pain  - obtaining a CT Urogram   Return for CT Urogram report and cystoscopy.  These notes generated with voice recognition software. I apologize for typographical errors.  Zara Council, [facility] [address], [place], [place] [address] [phone]

## 2016-11-01 LAB — BUN+CREAT
BUN/Creatinine Ratio: 16 (ref 9–23)
BUN: 11 mg/dL (ref 6–24)
Creatinine, Ser: 0.68 mg/dL (ref 0.57–1.00)
GFR calc non Af Amer: 105 (ref >59)
GFR, EST AFRICAN AMERICAN: 121 (ref >59)

## 2016-11-02 ENCOUNTER — Ambulatory Visit
Admission: RE | Admit: 2016-11-02 | Discharge: 2016-11-02 | Disposition: A | Discharge status: Home / Self Care | Payer: 59 | Source: Ambulatory Visit | Attending: Family Medicine | Admitting: Family Medicine

## 2016-11-02 DIAGNOSIS — Z1231 Encounter for screening mammogram for malignant neoplasm of breast: Secondary | ICD-10-CM | POA: Diagnosis not present

## 2016-11-02 IMAGING — MG MM DIGITAL SCREENING BILAT W/ TOMO W/ CAD
9 of 13 series · 9 of 29 positions shown · non-contrast
Comparison: Previous exam(s).

CLINICAL DATA: Screening.

EXAM:
2D DIGITAL SCREENING BILATERAL MAMMOGRAM WITH CAD AND ADJUNCT TOMO

[R MLO (1 of 2)]
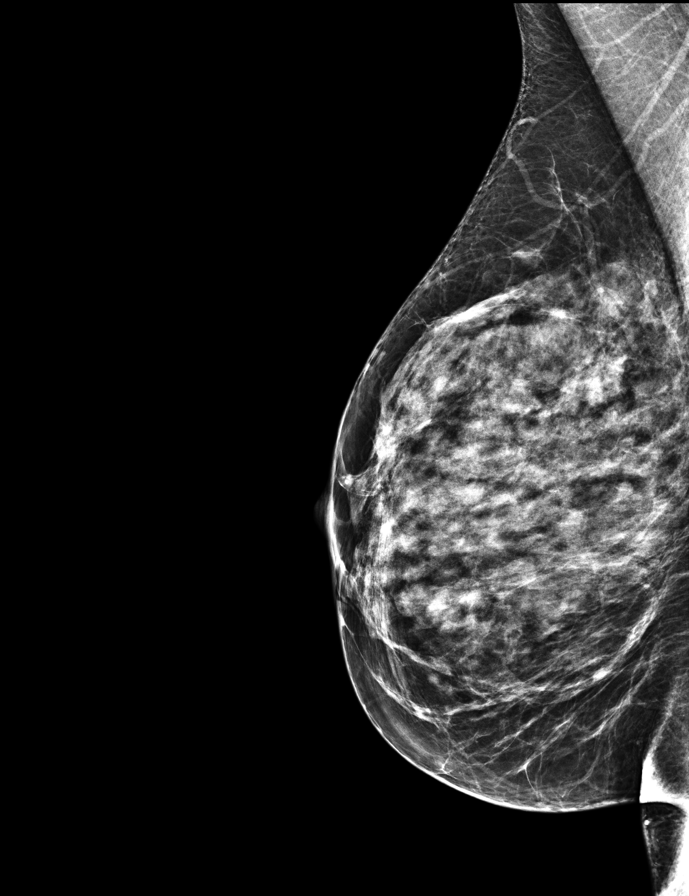

[L CC synth-2D]
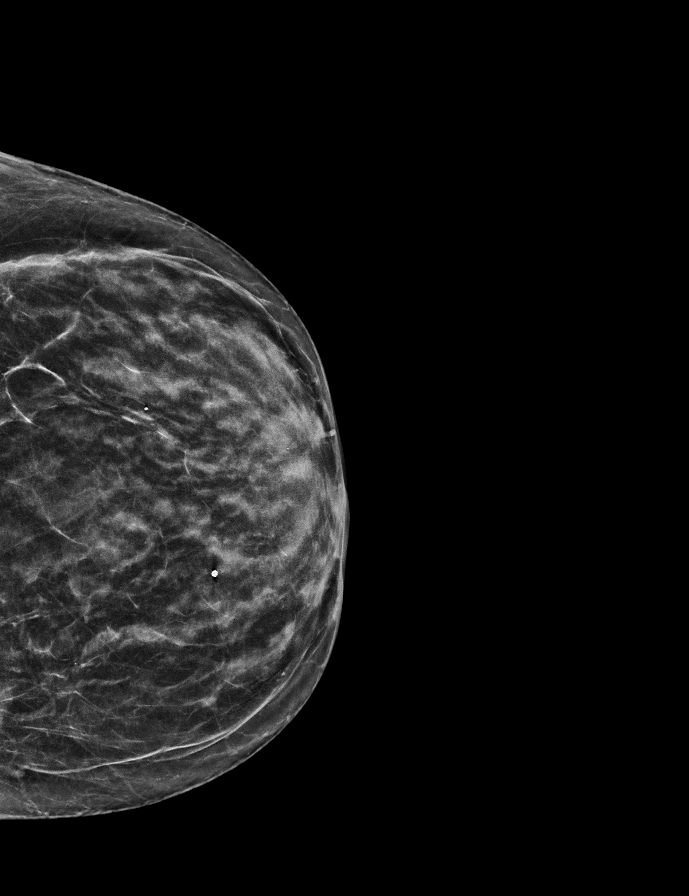

[R MLO synth-2D]
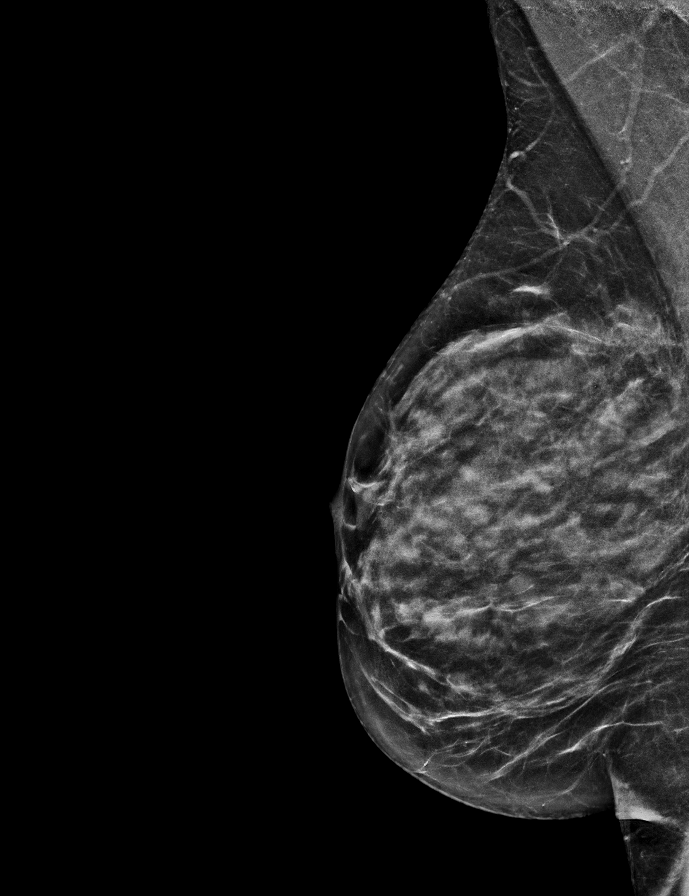

[R MLO (2 of 2)]
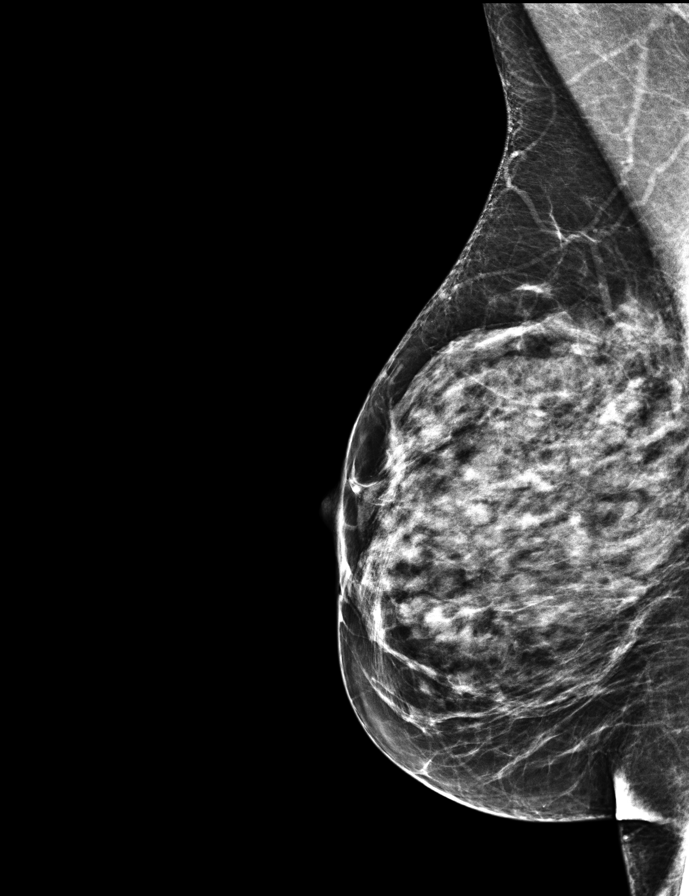

[L MLO synth-2D]
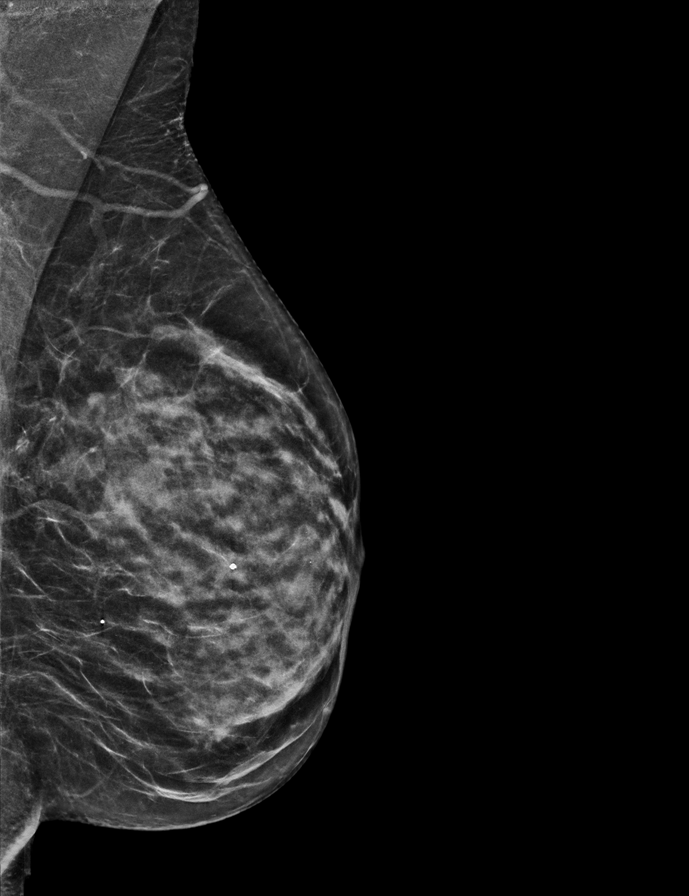

[R CC synth-2D]
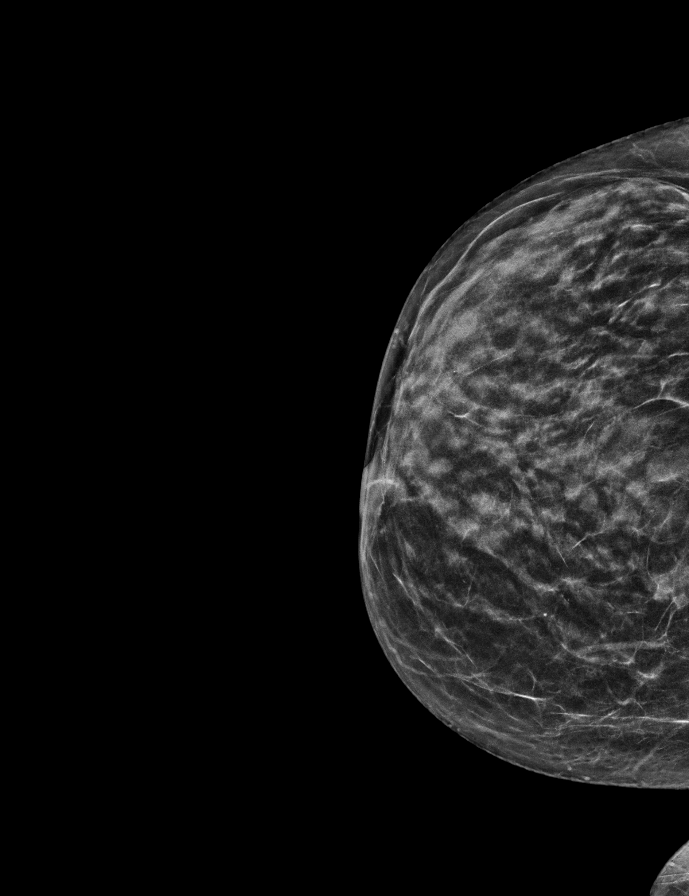

[R CC]
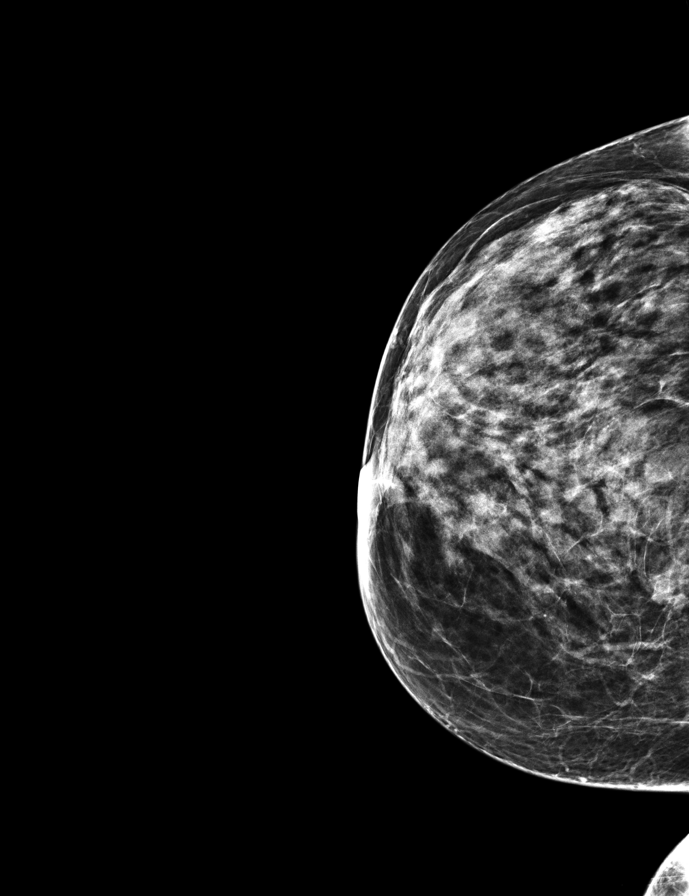

[L MLO]
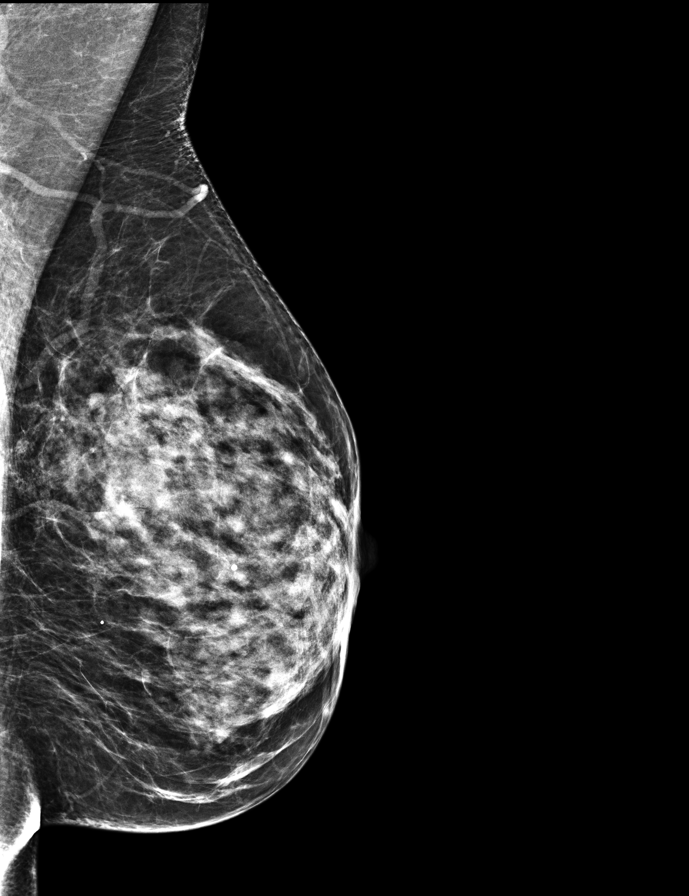

[L CC]
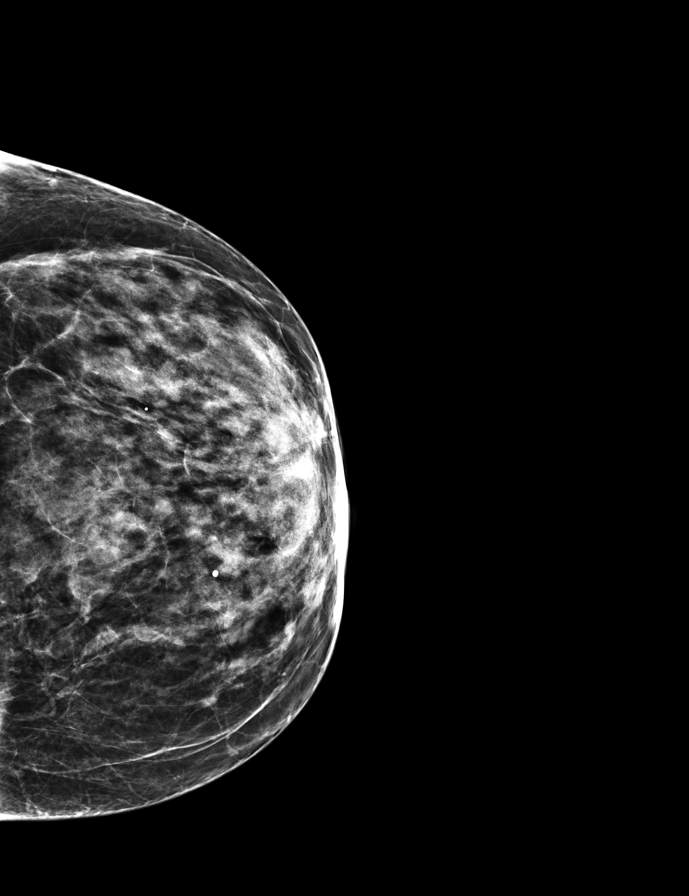

[9 of 29 positions shown; findings below may reference images not displayed]

ACR Breast Density Category c: The breast tissue is heterogeneously
dense, which may obscure small masses.
FINDINGS: There are no findings suspicious for malignancy. Images were
processed with CAD.
IMPRESSION: No mammographic evidence of malignancy. A result letter of this
screening mammogram will be mailed directly to the patient.

RECOMMENDATION:
Screening mammogram in one year. (Code:[TA])

BI-RADS CATEGORY  1: Negative.

## 2016-11-12 ENCOUNTER — Ambulatory Visit
Admission: RE | Admit: 2016-11-12 | Discharge: 2016-11-12 | Disposition: A | Discharge status: Home / Self Care | Payer: 59 | Source: Ambulatory Visit | Attending: Urology | Admitting: Urology

## 2016-11-12 DIAGNOSIS — R3129 Other microscopic hematuria: Secondary | ICD-10-CM | POA: Insufficient documentation

## 2016-11-12 DIAGNOSIS — R1900 Intra-abdominal and pelvic swelling, mass and lump, unspecified site: Secondary | ICD-10-CM | POA: Diagnosis not present

## 2016-11-12 DIAGNOSIS — I700 Atherosclerosis of aorta: Secondary | ICD-10-CM | POA: Diagnosis not present

## 2016-11-12 IMAGING — CT CT ABD-PEL WO/W CM
2 of 6 series · 13 of 32 positions shown, 18 images · IV contrast (APPLIED)
Comparison: CT the abdomen and pelvis [DATE].

CLINICAL DATA: 45-year-old female with history of right lower
quadrant abdominal pain for the past 2 months. Abdominal swelling.
Additional history of left-sided flank pain and microhematuria. Left
lower quadrant hematoma.

EXAM:
CT ABDOMEN AND PELVIS WITHOUT AND WITH CONTRAST
TECHNIQUE: Multidetector CT imaging of the abdomen and pelvis was performed
following the standard protocol before and following the bolus
administration of intravenous contrast.
CONTRAST:  125 mL of Isovue 370.

[Series 2: axial pre · axial · non-contrast · 0.71mm/px · z∈[-975,-695]mm · 6 of 80 slices shown]
[im 12/80  soft-tissue]
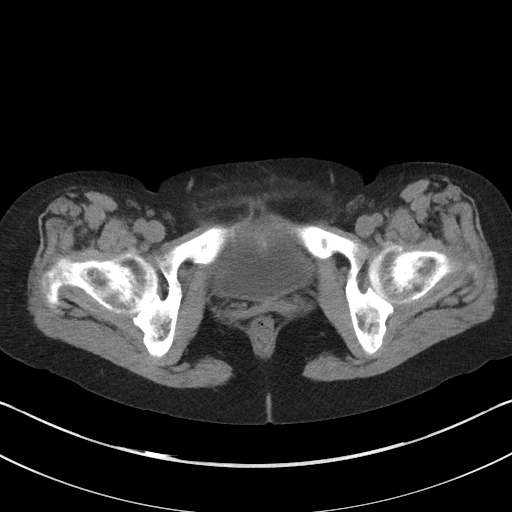
[im 23/80  soft-tissue]
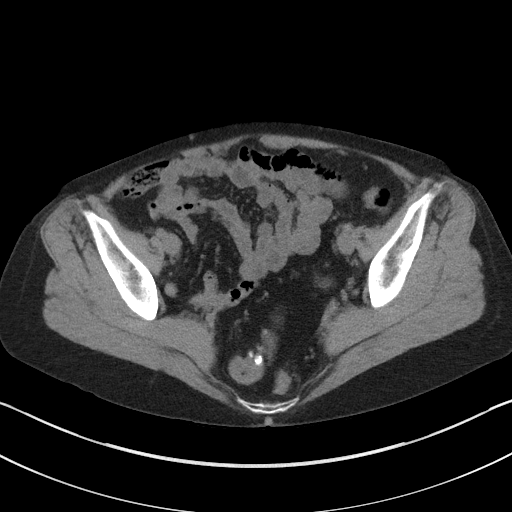
[im 34/80  soft-tissue]
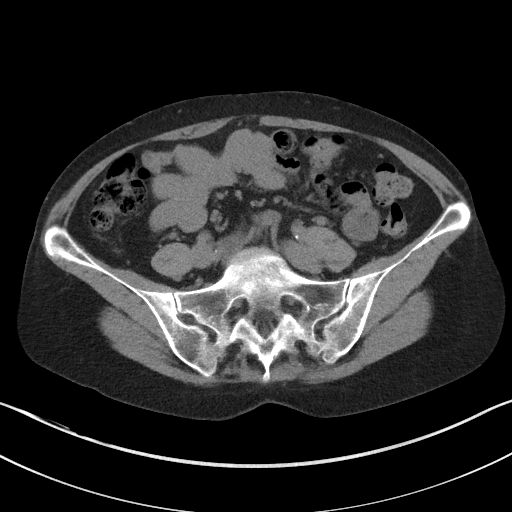
[im 46/80  soft-tissue]
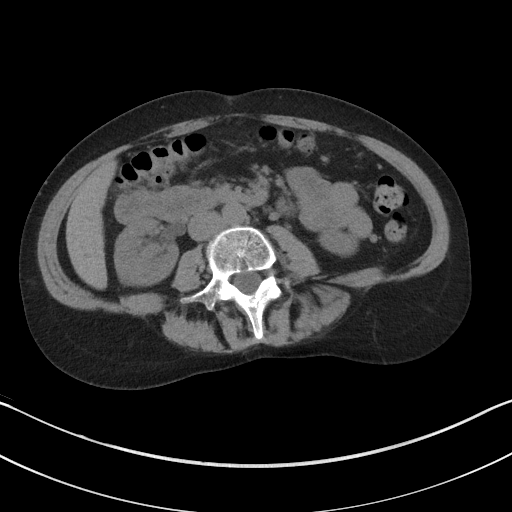
[im 57/80  soft-tissue]
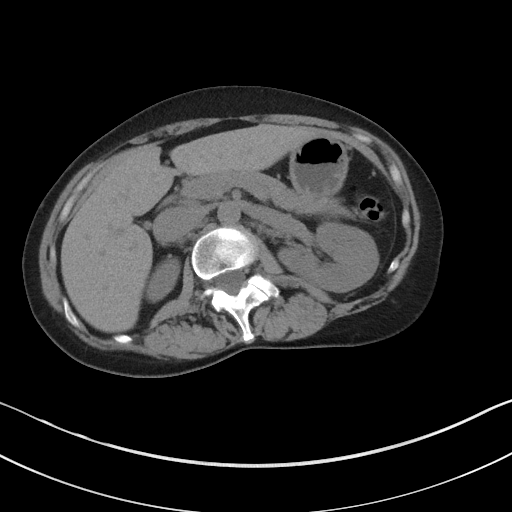
[im 68/80  soft-tissue]
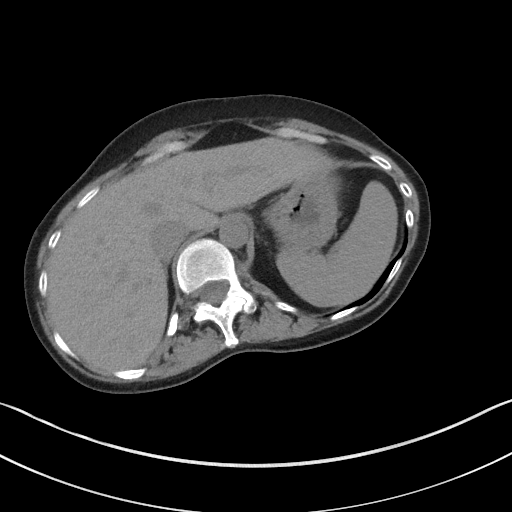

[Series 7: axial delay · axial · delayed · 0.70mm/px · z∈[-937,-617]mm · 7 of 86 slices shown, 12 images]
[im 11/86  soft-tissue]
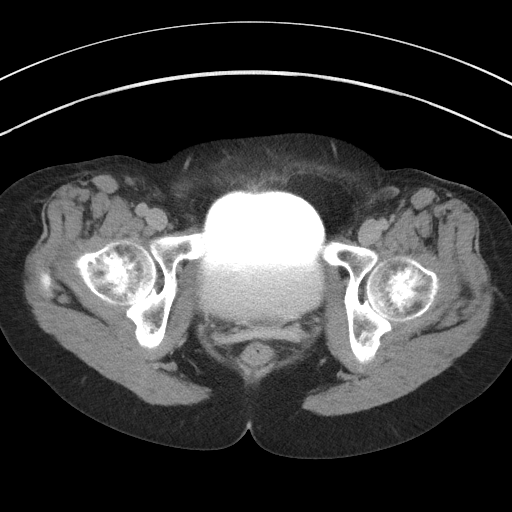
[im 11/86  bone]
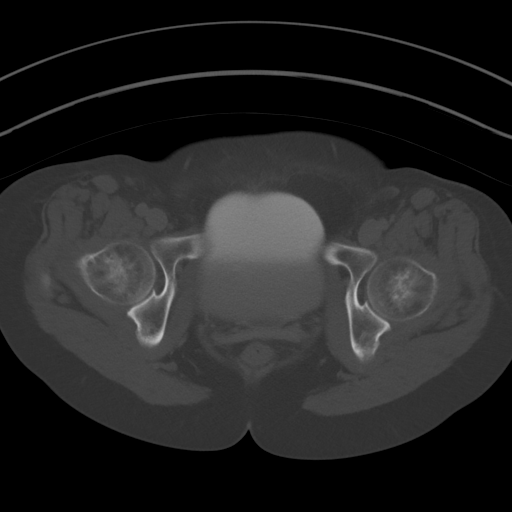
[im 22/86  soft-tissue]
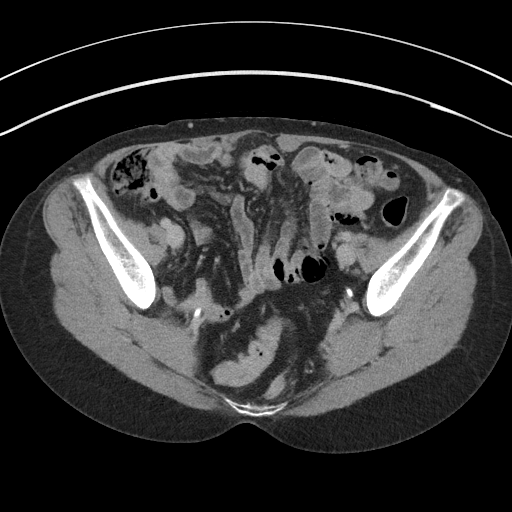
[im 32/86  soft-tissue]
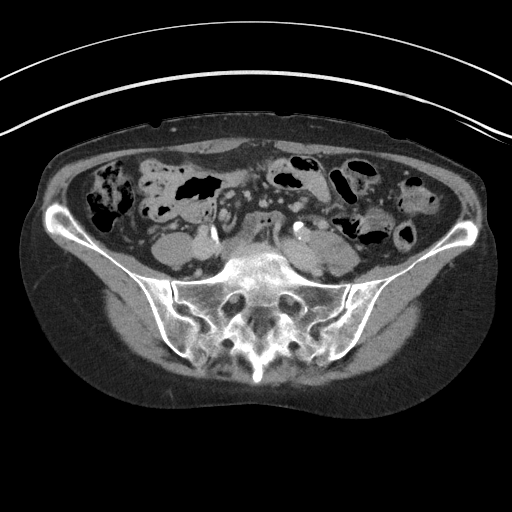
[im 43/86  soft-tissue]
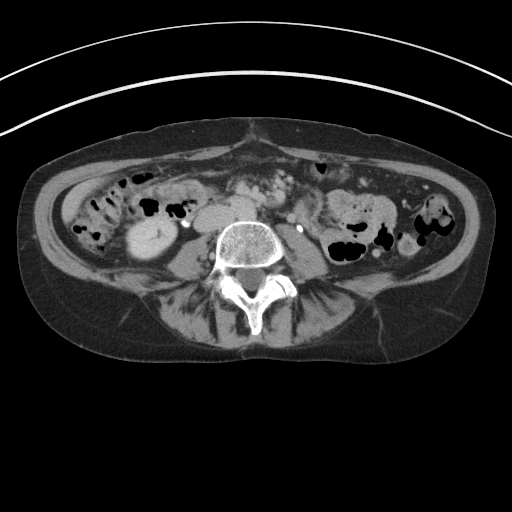
[im 43/86  lung]
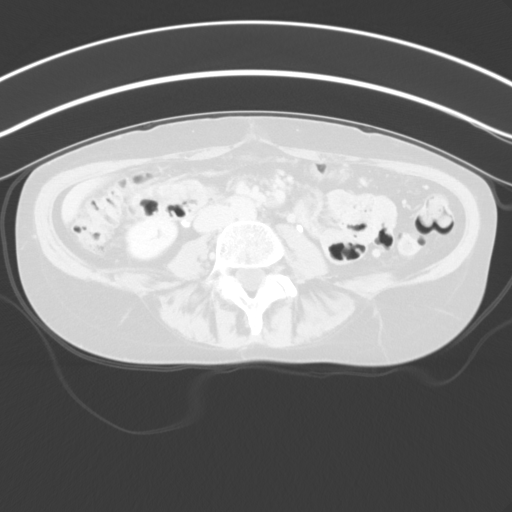
[im 54/86  soft-tissue]
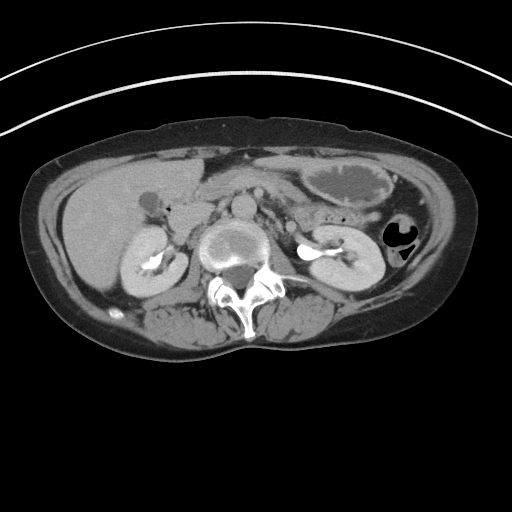
[im 54/86  lung]
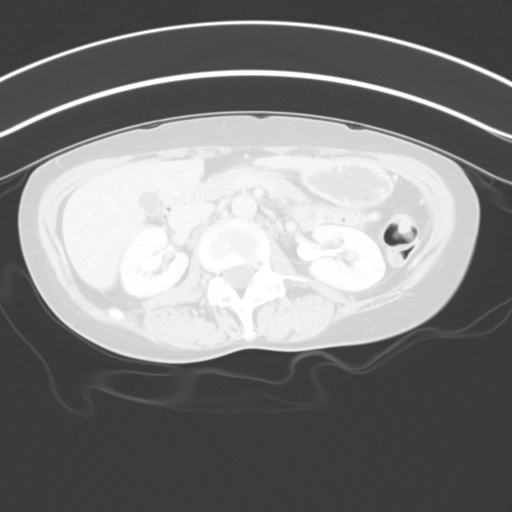
[im 64/86  soft-tissue]
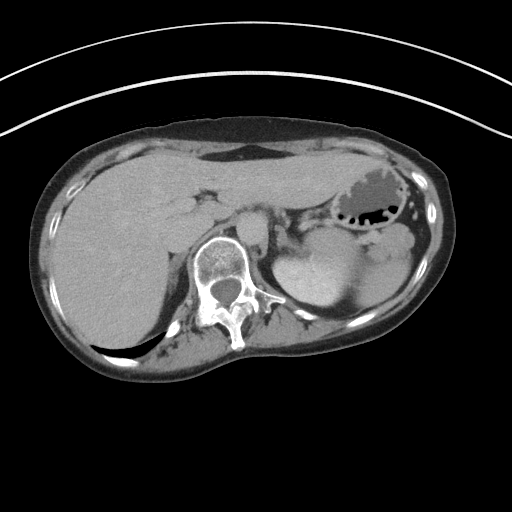
[im 64/86  lung]
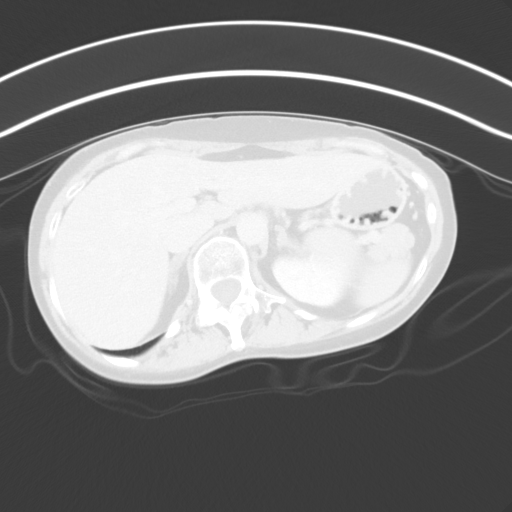
[im 75/86  soft-tissue]
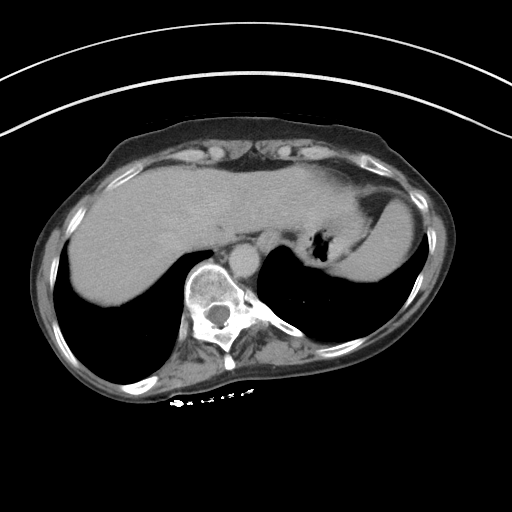
[im 75/86  lung]
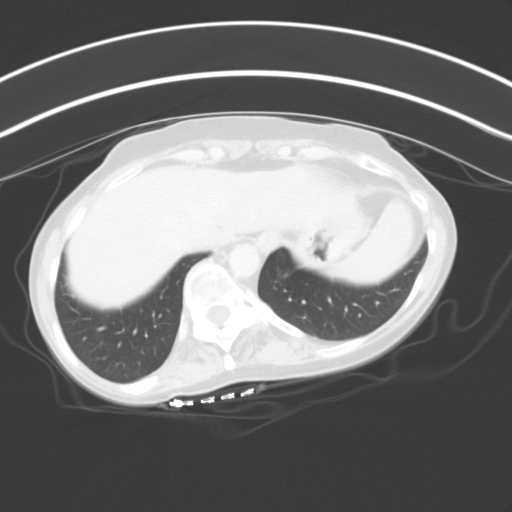

[13 of 32 positions shown; findings below may reference images not displayed]

FINDINGS: Lower chest: Unremarkable.

Hepatobiliary: No cystic or solid hepatic lesions. No intra or
extrahepatic biliary ductal dilatation. Gallbladder is normal in
appearance.

Pancreas: No pancreatic mass. No pancreatic ductal dilatation. No
pancreatic or peripancreatic fluid or inflammatory changes.

Spleen: Unremarkable.

Adrenals/Urinary Tract: Precontrast images demonstrate no
calcifications within the collecting system of either kidney, along
the course of either ureter, or within the lumen of the urinary
bladder. Post contrast images demonstrate no focal solid renal
lesions. Post contrast delayed images demonstrate no definite
filling defects within the collecting system of either kidney, along
the course of either ureter, or within the lumen of the urinary
bladder to strongly suggest the presence of a urothelial neoplasm.
Urinary bladder is normal in appearance. Bilateral adrenal glands
are normal in appearance.

Stomach/Bowel: The appearance of the stomach is normal. No
pathologic dilatation of small bowel or colon. Normal appendix.

Vascular/Lymphatic: Aortic atherosclerosis (mild), without evidence
of aneurysm or dissection in the abdominal or pelvic vasculature. No
lymphadenopathy noted in the abdomen or pelvis.

Reproductive: Status post hysterectomy. Ovaries are not confidently
identified may be surgically absent or atrophic.

Other: No significant volume of ascites.  No pneumoperitoneum.

Musculoskeletal: There are no aggressive appearing lytic or blastic
lesions noted in the visualized portions of the skeleton.
IMPRESSION: 1. No acute findings in the abdomen or pelvis to account for the
patient's symptoms. Specifically, no urinary tract calculi no
findings of urinary tract obstruction are noted at this time.
2. Normal appendix.
3. Aortic atherosclerosis (mild).

## 2016-11-12 MED ORDER — IOPAMIDOL (ISOVUE-370) INJECTION 76%
125.0000 mL | Freq: Once | INTRAVENOUS | Status: AC | PRN
  Administered 2016-11-12: 125 mL via INTRAVENOUS

## 2016-11-22 ENCOUNTER — Other Ambulatory Visit: Payer: Self-pay

## 2016-11-22 DIAGNOSIS — R3129 Other microscopic hematuria: Secondary | ICD-10-CM

## 2016-11-23 ENCOUNTER — Other Ambulatory Visit
Admission: RE | Admit: 2016-11-23 | Discharge: 2016-11-23 | Disposition: A | Discharge status: Home / Self Care | Payer: 59 | Source: Ambulatory Visit | Attending: Urology | Admitting: Urology

## 2016-11-23 ENCOUNTER — Ambulatory Visit (INDEPENDENT_AMBULATORY_CARE_PROVIDER_SITE_OTHER): Payer: 59 | Admitting: Urology

## 2016-11-23 ENCOUNTER — Encounter: Payer: Self-pay | Admitting: Urology

## 2016-11-23 VITALS — BP 108/77 | HR 75 | Ht 65.0 in | Wt 124.0 lb

## 2016-11-23 DIAGNOSIS — R3129 Other microscopic hematuria: Secondary | ICD-10-CM | POA: Diagnosis not present

## 2016-11-23 DIAGNOSIS — R1031 Right lower quadrant pain: Secondary | ICD-10-CM

## 2016-11-23 DIAGNOSIS — F17200 Nicotine dependence, unspecified, uncomplicated: Secondary | ICD-10-CM

## 2016-11-23 LAB — URINALYSIS, COMPLETE (UACMP) WITH MICROSCOPIC
Bacteria, UA: NONE SEEN
Bilirubin Urine: NEGATIVE
Glucose, UA: NEGATIVE mg/dL
Ketones, ur: NEGATIVE mg/dL
Leukocytes, UA: NEGATIVE
Nitrite: NEGATIVE
PROTEIN: NEGATIVE mg/dL
SPECIFIC GRAVITY, URINE: 1.015 (ref 1.005–1.030)
SQUAMOUS EPITHELIAL / LPF: NONE SEEN
pH: 6 (ref 5.0–8.0)

## 2016-11-23 MED ORDER — CIPROFLOXACIN HCL 500 MG PO TABS
500.0000 mg | ORAL_TABLET | Freq: Once | ORAL | Status: AC
  Administered 2016-11-23: 500 mg via ORAL

## 2016-11-29 ENCOUNTER — Encounter: Payer: Self-pay | Admitting: Family Medicine

## 2016-11-29 ENCOUNTER — Ambulatory Visit (INDEPENDENT_AMBULATORY_CARE_PROVIDER_SITE_OTHER): Payer: 59 | Admitting: Family Medicine

## 2016-11-29 DIAGNOSIS — I700 Atherosclerosis of aorta: Secondary | ICD-10-CM

## 2016-11-29 DIAGNOSIS — R718 Other abnormality of red blood cells: Secondary | ICD-10-CM

## 2016-11-29 DIAGNOSIS — R1031 Right lower quadrant pain: Secondary | ICD-10-CM | POA: Diagnosis not present

## 2016-11-29 DIAGNOSIS — E785 Hyperlipidemia, unspecified: Secondary | ICD-10-CM

## 2016-11-29 MED ORDER — ATORVASTATIN CALCIUM 20 MG PO TABS: 20.0000 mg | ORAL_TABLET | Freq: Every day | ORAL | 1 refills | Status: DC

## 2016-11-29 NOTE — Assessment & Plan Note (Signed)
Reviewed CT scan; normal appearing appendix; on exam, feels like a hernia; has already seen urologist for the hematuria; refer to surgeon for evaluation for hernia

## 2016-11-29 NOTE — Progress Notes (Signed)
BP 104/72   Pulse 84   Temp 97.4 F (36.3 C) (Oral)   Wt 126 lb 9.6 oz (57.4 kg)   SpO2 96%   BMI 21.07 kg/m    Subjective:    Patient ID: Kathryn Fuller, female    DOB: 1971/05/22, 46 y.o.   MRN: [id number]  HPI: Kathryn Fuller is a 46 y.o. female  Chief Complaint  Patient presents with  . Hematuria    still has blood in urine   . Follow-up    test results    Patient is here for f/u of hematuria and to go over test results First she saw Zara Council, Feb 21st for hematuria She saw the urologist, Dr. Erlene Quan Had urine done, moderate Hgb on the dipstick, 6-30 RBCs on the micro She has had two CT scans  First CT: IMPRESSION: No acute abnormality seen in the abdomen or pelvis.  Electronically Signed   By: Marijo Conception, M.D.   On: 10/15/2016 11:39 ------------------------------------------ Second CT 11/12/16: CLINICAL DATA:  46 year old female with history of right lower quadrant abdominal pain for the past 2 months. Abdominal swelling. Additional history of left-sided flank pain and microhematuria. Left lower quadrant hematoma.  EXAM: CT ABDOMEN AND PELVIS WITHOUT AND WITH CONTRAST  TECHNIQUE: Multidetector CT imaging of the abdomen and pelvis was performed following the standard protocol before and following the bolus administration of intravenous contrast.  CONTRAST:  125 mL of Isovue 370.  COMPARISON:  CT the abdomen and pelvis 10/15/2016.  FINDINGS: Lower chest: Unremarkable.  Hepatobiliary: No cystic or solid hepatic lesions. No intra or extrahepatic biliary ductal dilatation. Gallbladder is normal in appearance.  Pancreas: No pancreatic mass. No pancreatic ductal dilatation. No pancreatic or peripancreatic fluid or inflammatory changes.  Spleen: Unremarkable.  Adrenals/Urinary Tract: Precontrast images demonstrate no calcifications within the collecting system of either kidney, along the course of either ureter, or  within the lumen of the urinary bladder. Post contrast images demonstrate no focal solid renal lesions. Post contrast delayed images demonstrate no definite filling defects within the collecting system of either kidney, along the course of either ureter, or within the lumen of the urinary bladder to strongly suggest the presence of a urothelial neoplasm. Urinary bladder is normal in appearance. Bilateral adrenal glands are normal in appearance.  Stomach/Bowel: The appearance of the stomach is normal. No pathologic dilatation of small bowel or colon. Normal appendix.  Vascular/Lymphatic: Aortic atherosclerosis (mild), without evidence of aneurysm or dissection in the abdominal or pelvic vasculature. No lymphadenopathy noted in the abdomen or pelvis.  Reproductive: Status post hysterectomy. Ovaries are not confidently identified may be surgically absent or atrophic.  Other: No significant volume of ascites.  No pneumoperitoneum.  Musculoskeletal: There are no aggressive appearing lytic or blastic lesions noted in the visualized portions of the skeleton.  IMPRESSION: 1. No acute findings in the abdomen or pelvis to account for the patient's symptoms. Specifically, no urinary tract calculi no findings of urinary tract obstruction are noted at this time. 2. Normal appendix. 3. Aortic atherosclerosis (mild).   Electronically Signed   By: Vinnie Langton M.D.   On: 11/12/2016 16:05  Atherosclerosis in the aorta; some room for improvement with diet she admits Lab Results  Component Value Date   CHOL 242 (H) 10/17/2016   CHOL 255 (H) 10/05/2016   CHOL 237 (H) 03/25/2015   Lab Results  Component Value Date   HDL 34 (L) 10/17/2016   HDL 37 (L) 10/05/2016  HDL 45 03/25/2015   Lab Results  Component Value Date   LDLCALC 149 (H) 10/17/2016   LDLCALC NOT CALC 10/05/2016   LDLCALC 168 (H) 03/25/2015   Lab Results  Component Value Date   TRIG 294 (H) 10/17/2016    TRIG 511 (H) 10/05/2016   TRIG 121 03/25/2015   Lab Results  Component Value Date   CHOLHDL 7.1 (H) 10/17/2016   CHOLHDL 6.9 (H) 10/05/2016   No results found for: LDLDIRECT   Depression screen [facility] 2/9 11/29/2016 10/17/2016 10/05/2016 08/09/2015  Decreased Interest 0 0 0 0  Down, Depressed, Hopeless 0 0 0 0  PHQ - 2 Score 0 0 0 0   Relevant past medical, surgical, family and social history reviewed Past Medical History:  Diagnosis Date  . Abdominal aortic atherosclerosis ([place]) 12/17/2016   Noted on CT scan March 2018  . Diverticulitis   . Diverticulosis of colon 10/16   Hospitalized   . Dyslipidemia 06/05/2015  . Endometriosis   . Pneumonia   . Scoliosis 1981   Treated at the [facility] in [place], [place]. Reported 30 rotation.  . Tobacco abuse 03/27/2015   Past Surgical History:  Procedure Laterality Date  . ABDOMINAL HYSTERECTOMY  2004   TAH-BSO  . INGUINAL HERNIA REPAIR Right 2008   Dr. Jamal Collin   Family History  Problem Relation Age of Onset  . Diabetes Mother   . Dementia Mother   . Heart disease Father   . Dementia Father   . Heart disease Maternal Uncle   . Heart attack Maternal Uncle   . Heart disease Maternal Grandmother   . Heart attack Maternal Grandmother   . Lupus Other     niece  . Breast cancer Neg Hx   . Cancer Neg Hx   . COPD Neg Hx   . Stroke Neg Hx   . Bladder Cancer Neg Hx   . Kidney cancer Neg Hx   . Prostate cancer Neg Hx    Social History  Substance Use Topics  . Smoking status: Current Every Day Smoker    Packs/day: 1.00    Years: 25.00    Types: Cigarettes  . Smokeless tobacco: Never Used  . Alcohol use No    Interim medical history since last visit reviewed. Allergies and medications reviewed  Review of Systems Per HPI unless specifically indicated above     Objective:    BP 104/72   Pulse 84   Temp 97.4 F (36.3 C) (Oral)   Wt 126 lb 9.6 oz (57.4 kg)   SpO2 96%   BMI 21.07 kg/m   Wt Readings from Last 3  Encounters:  12/11/16 127 lb (57.6 kg)  11/29/16 126 lb 9.6 oz (57.4 kg)  11/23/16 124 lb (56.2 kg)    Physical Exam  Constitutional: She appears well-developed and well-nourished. No distress.  HENT:  Head: Normocephalic and atraumatic.  Eyes: EOM are normal. No scleral icterus.  Neck: No thyromegaly present.  Cardiovascular: Normal rate, regular rhythm and normal heart sounds.   No murmur heard. Pulmonary/Chest: Effort normal and breath sounds normal. No respiratory distress. She has no wheezes.  Abdominal: Soft. Bowel sounds are normal. She exhibits no distension and no abdominal bruit.  Bulging deformity along RIGHT lower quadrant  Musculoskeletal: Normal range of motion. She exhibits no edema.  Neurological: She is alert. She exhibits normal muscle tone.  Skin: Skin is warm and dry. She is not diaphoretic. No pallor.  Aged beyond years consistent with years of  cigarette smoking  Psychiatric: She has a normal mood and affect. Her behavior is normal. Judgment and thought content normal.    Results for orders placed or performed during the hospital encounter of 11/23/16  Urinalysis, Complete w Microscopic  Result Value Ref Range   Color, Urine YELLOW YELLOW   APPearance CLEAR CLEAR   Specific Gravity, Urine 1.015 1.005 - 1.030   pH 6.0 5.0 - 8.0   Glucose, UA NEGATIVE NEGATIVE mg/dL   Hgb urine dipstick MODERATE (A) NEGATIVE   Bilirubin Urine NEGATIVE NEGATIVE   Ketones, ur NEGATIVE NEGATIVE mg/dL   Protein, ur NEGATIVE NEGATIVE mg/dL   Nitrite NEGATIVE NEGATIVE   Leukocytes, UA NEGATIVE NEGATIVE   Squamous Epithelial / LPF NONE SEEN NONE SEEN   WBC, UA 0-5 0 - 5 WBC/hpf   RBC / HPF 6-30 0 - 5 RBC/hpf   Bacteria, UA NONE SEEN NONE SEEN      Assessment & Plan:   Problem List Items Addressed This Visit      Cardiovascular and Mediastinum   Abdominal aortic atherosclerosis ([place])    Discussed findings; explained cigarette smoking implicated, as well as elevated LDL;  urged smoking cessation; will try to get LDL down, ideally under 70; dietary recommendations maade        Other   Right lower quadrant pain    Reviewed CT scan; normal appearing appendix; on exam, feels like a hernia; has already seen urologist for the hematuria; refer to surgeon for evaluation for hernia      Relevant Orders   Ambulatory referral to General Surgery   Elevated hematocrit    Reviewed last three values with her; may have been spurious, dehydration, but regardless, the H/H are back to normal now; had discussion with her about role of smoking      Dyslipidemia    Reviewed lipids; start statin; recheck in 6 weeks; goal LDL less than 70          Follow up plan: No Follow-up on file.  An after-visit summary was printed and given to the patient at [facility].  Please see the patient instructions which may contain other information and recommendations beyond what is mentioned above in the assessment and plan.  Meds ordered this encounter  Medications  . DISCONTD: atorvastatin (LIPITOR) 20 MG tablet    Sig: Take 1 tablet (20 mg total) by mouth daily.    Dispense:  30 tablet    Refill:  1    Orders Placed This Encounter  Procedures  . Ambulatory referral to General Surgery

## 2016-11-29 NOTE — Patient Instructions (Addendum)
Start the new cholesterol medicine Take it before bed on an empty stomach Return for fasting labs only in 6-8 weeks Try to limit saturated fats in your diet (bologna, hot dogs, barbeque, cheeseburgers, hamburgers, steak, bacon, sausage, cheese, etc.) and get more fresh fruits, vegetables, and whole grains Please do take 1,000 iu of vitamin D3 once a day  I do encourage you to quit smoking Call [phone] to sign up for smoking cessation classes You can call 1-800-QUIT-NOW to talk with a smoking cessation coach

## 2016-11-30 ENCOUNTER — Encounter: Payer: Self-pay | Admitting: General Surgery

## 2016-12-02 NOTE — Progress Notes (Signed)
   11/23/16  CC:  Chief Complaint  Patient presents with  . Cysto    Cystoscopy and CT urogram Results    HPI: 46 yo female who presents today for cystoscopy to complete her microscopic hematuria workup. She underwent CT urogram on 11/12/2016 which was unremarkable for any GU pathology.  She continues having intermittent episodes of right lower quadrant pain of unclear etiology.   She is a smoker.  Blood pressure 108/77, pulse 75, height 5\' 5"  (1.651 m), weight 124 lb (56.2 kg). NED. A&Ox3.   No respiratory distress   Abd soft, NT, ND Normal external genitalia with patent urethral meatus  Cystoscopy Procedure Note  Patient identification was confirmed, informed consent was obtained, and patient was prepped using Betadine solution.  Lidocaine jelly was administered per urethral meatus.    Preoperative abx where received prior to procedure.    Procedure: - Flexible cystoscope introduced, without any difficulty.   - Thorough search of the bladder revealed:    normal urethral meatus    normal urothelium    no stones    no ulcers     no tumors    no urethral polyps    no trabeculation  - Ureteral orifices were normal in position and appearance.  Post-Procedure: - Patient tolerated the procedure well  Assessment/ Plan:  1. Microscopic hematuria s/p negative work up including CT urogram and cystoscopy - ciprofloxacin (CIPRO) tablet 500 mg; Take 1 tablet (500 mg total) by mouth once.  2. Right lower quadrant pain Etiology unclear, not likely GU related Advised to f/u with PCP  3. Smoking We discussed the causative relationship between the of bladder cancer and smoking Smoking cessation was highly encouraged today I have recommended repeat cystoscopy in 2-3 years of her microscopic hematuria persists or she continues to smoke and her high risk for development of bladder cancer    Return for referr back in 2 years if microscopic hematuria persists.   Hollice Espy, MD

## 2016-12-11 ENCOUNTER — Encounter: Payer: Self-pay | Admitting: General Surgery

## 2016-12-11 ENCOUNTER — Ambulatory Visit (INDEPENDENT_AMBULATORY_CARE_PROVIDER_SITE_OTHER): Payer: 59 | Admitting: General Surgery

## 2016-12-11 VITALS — BP 120/66 | HR 68 | Resp 12 | Ht 65.0 in | Wt 127.0 lb

## 2016-12-11 DIAGNOSIS — G8929 Other chronic pain: Secondary | ICD-10-CM

## 2016-12-11 DIAGNOSIS — R1031 Right lower quadrant pain: Secondary | ICD-10-CM

## 2016-12-11 NOTE — Progress Notes (Signed)
Patient ID: Kathryn Fuller, female   DOB: 14-Nov-1970, 46 y.o.   MRN: [id number]  Chief Complaint  Patient presents with  . Abdominal Pain    HPI Kathryn Fuller is a 46 y.o. female here today for a evaluation abdominal pain been going on for two months. The pain is in her right lower quadrant and last for a couple of hours. No food triggers the pain but, feels like a burning sensation. Kathryn Fuller had a ct  hematuria workup done on 11/12/2016. Moves her bowels every two days. In 2016, Kathryn Fuller had a bad case of diverticulosis.  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 HPI  Past Medical History:  Diagnosis Date  . Diverticulitis   . Diverticulosis of colon 10/16   Hospitalized   . Endometriosis   . Pneumonia     Past Surgical History:  Procedure Laterality Date  . ABDOMINAL HYSTERECTOMY  2004   TAH-BSO  . INGUINAL HERNIA REPAIR Right 2008   Dr. Jamal Collin    Family History  Problem Relation Age of Onset  . Diabetes Mother   . Dementia Mother   . Heart disease Father   . Dementia Father   . Heart disease Maternal Uncle   . Heart attack Maternal Uncle   . Heart disease Maternal Grandmother   . Heart attack Maternal Grandmother   . Lupus Other     niece  . Breast cancer Neg Hx   . Cancer Neg Hx   . COPD Neg Hx   . Stroke Neg Hx   . Bladder Cancer Neg Hx   . Kidney cancer Neg Hx   . Prostate cancer Neg Hx     Social History Social History  Substance Use Topics  . Smoking status: Current Every Day Smoker    Packs/day: 1.00    Years: 25.00    Types: Cigarettes  . Smokeless tobacco: Never Used  . Alcohol use No    No Known Allergies  Current Outpatient Prescriptions  Medication Sig Dispense Refill  . acetaminophen (TYLENOL) 500 MG tablet Take 500 mg by mouth every 6 (six) hours as needed.     No current facility-administered medications for this visit.     Review of Systems Review of Systems  Constitutional: Negative.   Respiratory: Negative.    Cardiovascular: Negative.   Gastrointestinal: Positive for abdominal pain.    Blood pressure 120/66, pulse 68, resp. rate 12, height 5\' 5"  (1.651 m), weight 127 lb (57.6 kg).  Physical Exam Physical Exam  Constitutional: Kathryn Fuller is oriented to person, place, and time. Kathryn Fuller appears well-developed and well-nourished.  Eyes: Conjunctivae are normal. No scleral icterus.  Neck: Neck supple.  Cardiovascular: Normal rate, regular rhythm and normal heart sounds.   Pulmonary/Chest: Effort normal and breath sounds normal.  Abdominal: Soft. Normal appearance and bowel sounds are normal. Kathryn Fuller exhibits no distension and no mass. There is no hepatomegaly. There is no tenderness. No hernia. Hernia confirmed negative in the ventral area, confirmed negative in the right inguinal area and confirmed negative in the left inguinal area.    Lymphadenopathy:    Kathryn Fuller has no cervical adenopathy.       Right: No inguinal adenopathy present.       Left: No inguinal adenopathy present.  Neurological: Kathryn Fuller is alert and oriented to person, place, and time.  Skin: Skin is warm and dry.    Data Reviewed The patient was admitted for 2 days in September 2016 with a diagnosis of the  colonic diverticulitis with suspected microperforation. Hospital record reviewed. Treated with IV followed by oral analgesics with rapid improvement. Follow-up CT posttreatment day 8 showed near complete resolution of the fluid collection. Patient's CBC was normal at the time of her hospitalization with diverticulitis.  Urology evaluation for microscopic hematuria from March 2018 reviewed.  CT of the abdomen and pelvis dated 10/15/2016 was reviewed. No evidence of recurrent diverticulitis. The patient does have a tortuous sigmoid colon. Appendix well-visualized.  CT for hematuria workup dated 11/12/2016 reviewed. Normal.  10/04/2006 laparoscopic port site hernia repair completed by Dr. Jamal Collin reviewed. Primary repair with 0 Gore-Tex  sutures.  Assessment    No clear etiology for her present abdominal pain. No evidence of appendicitis or recurrent ventral hernia.    Plan    At this time, I think the risk of diagnostic laparoscopy is likely greater than the possibility of identifying a treatable pathology. I've encouraged her to continue her regular activities and report if Kathryn Fuller appreciate significant change in her symptoms.  The patient was advised that at present there are no absolute dietary contraindications with a past history of diverticulitis. Gradual reintroduction of favored foods with avoidance of those of produce symptoms was encouraged.     The patient is aware of these risks Kathryn Fuller assumes with ongoing smoking.  Candidate for screening colonoscopy at age 28.  Patient to return as needed.  .  This information has been scribed by Gaspar Cola CMA.   Robert Bellow 12/11/2016, 8:53 PM

## 2016-12-11 NOTE — Patient Instructions (Signed)
Patient to return as needed. 

## 2016-12-17 ENCOUNTER — Ambulatory Visit (INDEPENDENT_AMBULATORY_CARE_PROVIDER_SITE_OTHER): Payer: 59 | Admitting: Family Medicine

## 2016-12-17 ENCOUNTER — Encounter: Payer: Self-pay | Admitting: Family Medicine

## 2016-12-17 DIAGNOSIS — Z720 Tobacco use: Secondary | ICD-10-CM | POA: Diagnosis not present

## 2016-12-17 DIAGNOSIS — I700 Atherosclerosis of aorta: Secondary | ICD-10-CM

## 2016-12-17 DIAGNOSIS — R1031 Right lower quadrant pain: Secondary | ICD-10-CM | POA: Diagnosis not present

## 2016-12-17 HISTORY — DX: Atherosclerosis of aorta: I70.0

## 2016-12-17 NOTE — Progress Notes (Signed)
BP 110/62   Pulse 95   Temp 97.9 F (36.6 C) (Oral)   Resp 14   Wt 126 lb 12.8 oz (57.5 kg)   SpO2 97%   BMI 21.10 kg/m    Subjective:    Patient ID: Kathryn Fuller, female    DOB: 01-18-1971, 46 y.o.   MRN: [id number]  HPI: Kathryn Fuller is a 46 y.o. female  Chief Complaint  Patient presents with  . Follow-up   She is still having RLQ pain and bulging in the lower abdomen; even has it circled with a pen today; she saw one surgeon, but says she would like a 2nd opinion (wants to see another surgeon); she is distended and knows something is different Pulls and burns No problems with moving bowels; not sure if hemorrhoids or not; wipes if excessive bleeding, but hemorrhoids there, outside Also having left flank pain, 3 days, but actually going on for weeks; she is up to her pregnancy weight; no chest pain, a little trouble breathing with deep breaths but not SHOB "something is going on", wonders if CT scan didn't pick it up; not sure if needs to see a back doctor; known scoliosis She has had blood in her urine and has seen urologist for that (Dr. Hollice Espy)  Depression screen [facility] 2/9 12/17/2016 11/29/2016 10/17/2016 10/05/2016 08/09/2015  Decreased Interest 0 0 0 0 0  Down, Depressed, Hopeless 0 0 0 0 0  PHQ - 2 Score 0 0 0 0 0   Relevant past medical, surgical, family and social history reviewed Past Medical History:  Diagnosis Date  . Abdominal aortic atherosclerosis ([place]) 12/17/2016   Noted on CT scan March 2018  . Diverticulitis   . Diverticulosis of colon 10/16   Hospitalized   . Dyslipidemia 06/05/2015  . Endometriosis   . Pneumonia   . Scoliosis 1981   Treated at the [facility] in [place], [place]. Reported 30 rotation.  . Tobacco abuse 03/27/2015   Past Surgical History:  Procedure Laterality Date  . ABDOMINAL HYSTERECTOMY  2004   TAH-BSO  . INGUINAL HERNIA REPAIR Right 2008   Dr. Jamal Collin   Family History  Problem Relation Age of Onset    . Diabetes Mother   . Dementia Mother   . Heart disease Father   . Dementia Father   . Heart disease Maternal Uncle   . Heart attack Maternal Uncle   . Heart disease Maternal Grandmother   . Heart attack Maternal Grandmother   . Lupus Other     niece  . Breast cancer Neg Hx   . Cancer Neg Hx   . COPD Neg Hx   . Stroke Neg Hx   . Bladder Cancer Neg Hx   . Kidney cancer Neg Hx   . Prostate cancer Neg Hx    Social History  Substance Use Topics  . Smoking status: Current Every Day Smoker    Packs/day: 1.00    Years: 25.00    Types: Cigarettes  . Smokeless tobacco: Never Used  . Alcohol use No  MD note: not ready to quit  Interim medical history since last visit reviewed. Allergies and medications reviewed  Review of Systems Per HPI unless specifically indicated above     Objective:    BP 110/62   Pulse 95   Temp 97.9 F (36.6 C) (Oral)   Resp 14   Wt 126 lb 12.8 oz (57.5 kg)   SpO2 97%   BMI 21.10 kg/m  Wt Readings from Last 3 Encounters:  12/17/16 126 lb 12.8 oz (57.5 kg)  12/11/16 127 lb (57.6 kg)  11/29/16 126 lb 9.6 oz (57.4 kg)    Physical Exam  Constitutional: She appears well-developed and well-nourished. No distress.  Eyes: No scleral icterus.  Cardiovascular: Normal rate.   Pulmonary/Chest: Effort normal.  Abdominal: Soft. Bowel sounds are normal. She exhibits no distension and no abdominal bruit.  Visible and palpable bulging deformity along RIGHT lower quadrant; patient has it circled with ink; in addition, entire abdomen is distended, not hard  Neurological: She is alert.  Skin: Skin is warm and dry. She is not diaphoretic. No pallor.  Aged beyond years consistent with years of cigarette smoking  Psychiatric: She has a normal mood and affect. Her behavior is normal. Judgment and thought content normal.    Results for orders placed or performed during the hospital encounter of 11/23/16  Urinalysis, Complete w Microscopic  Result Value Ref  Range   Color, Urine YELLOW YELLOW   APPearance CLEAR CLEAR   Specific Gravity, Urine 1.015 1.005 - 1.030   pH 6.0 5.0 - 8.0   Glucose, UA NEGATIVE NEGATIVE mg/dL   Hgb urine dipstick MODERATE (A) NEGATIVE   Bilirubin Urine NEGATIVE NEGATIVE   Ketones, ur NEGATIVE NEGATIVE mg/dL   Protein, ur NEGATIVE NEGATIVE mg/dL   Nitrite NEGATIVE NEGATIVE   Leukocytes, UA NEGATIVE NEGATIVE   Squamous Epithelial / LPF NONE SEEN NONE SEEN   WBC, UA 0-5 0 - 5 WBC/hpf   RBC / HPF 6-30 0 - 5 RBC/hpf   Bacteria, UA NONE SEEN NONE SEEN      Assessment & Plan:   Problem List Items Addressed This Visit      Other   Tobacco abuse    Not ready to quit; I'm here if/when she is ready      Right lower quadrant pain    Patient politely requests a 2nd opinion; referral to another surgeon entered; reviewed CT scan; has already seen urologist; if nothing surgical (hernia), then patient to call me      Relevant Orders   Ambulatory referral to General Surgery      Follow up plan: No Follow-up on file.  An after-visit summary was printed and given to the patient at [facility].  Please see the patient instructions which may contain other information and recommendations beyond what is mentioned above in the assessment and plan.  No orders of the defined types were placed in this encounter.   Orders Placed This Encounter  Procedures  . Ambulatory referral to General Surgery

## 2016-12-17 NOTE — Patient Instructions (Signed)
We'll have you see a surgeon Call me after the appointment

## 2016-12-17 NOTE — Assessment & Plan Note (Signed)
Reviewed last three values with her; may have been spurious, dehydration, but regardless, the H/H are back to normal now; had discussion with her about role of smoking

## 2016-12-17 NOTE — Assessment & Plan Note (Signed)
Reviewed lipids; start statin; recheck in 6 weeks; goal LDL less than 70

## 2016-12-17 NOTE — Assessment & Plan Note (Signed)
Discussed findings; explained cigarette smoking implicated, as well as elevated LDL; urged smoking cessation; will try to get LDL down, ideally under 70; dietary recommendations maade

## 2016-12-17 NOTE — Assessment & Plan Note (Addendum)
Patient politely requests a 2nd opinion; referral to another surgeon entered; reviewed CT scan; has already seen urologist; if nothing surgical (hernia), then patient to call me

## 2016-12-19 NOTE — Assessment & Plan Note (Signed)
Not ready to quit; I'm here if/when she is ready

## 2016-12-20 ENCOUNTER — Encounter: Payer: Self-pay | Admitting: General Surgery

## 2016-12-25 ENCOUNTER — Encounter: Payer: Self-pay | Admitting: Surgery

## 2016-12-25 ENCOUNTER — Ambulatory Visit (INDEPENDENT_AMBULATORY_CARE_PROVIDER_SITE_OTHER): Payer: 59 | Admitting: Surgery

## 2016-12-25 VITALS — BP 116/75 | HR 79 | Temp 97.4°F | Ht 65.0 in | Wt 126.6 lb

## 2016-12-25 DIAGNOSIS — R1084 Generalized abdominal pain: Secondary | ICD-10-CM

## 2016-12-25 NOTE — Progress Notes (Signed)
Surgical Clinic History and Physical  Referring provider:  Arnetha Courser, MD [address] [place], [place] [address]  HISTORY OF PRESENT ILLNESS (HPI):  46 y.o. female presents for evaluation of intermittent RLQ "tugging and pulling" abdominal pain x ~3 months, that she reports occurs every 2 - 3 days and for which she underwent outpatient CT imaging (11/12/2016), was recently evaluated in office by surgeon Dr. Bary Castilla (12/11/2016), and presents today for a second opinion. She also describes constant Left back/flank pain x ~3 months. 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 She does not recall any association with eating, drinking, or bowel function. Patient otherwise reports chronic back pain, for which she expresses she'd like to find a spine surgeon/physician, and she denies any recent trauma (abdominal or otherwise), fever/chills, constipation (BM q1 - 2 days), N/V, CP, or SOB.  PAST MEDICAL HISTORY (PMH):  Past Medical History:  Diagnosis Date  . Abdominal aortic atherosclerosis ([place]) 12/17/2016   Noted on CT scan March 2018  . Diverticulitis   . Diverticulosis of colon 10/16   Hospitalized   . Dyslipidemia 06/05/2015  . Endometriosis   . Pneumonia   . Scoliosis 1981   Treated at the [facility] in [place], [place]. Reported 30 rotation.  . Tobacco abuse 03/27/2015     PAST SURGICAL HISTORY [facility]):  Past Surgical History:  Procedure Laterality Date  . ABDOMINAL HYSTERECTOMY  2004   TAH-BSO  . [place]  2006  . INGUINAL HERNIA REPAIR Right 2008   Dr. Jamal Collin     MEDICATIONS:  Prior to Admission medications   Medication Sig Start Date End Date Taking? Authorizing Provider  acetaminophen (TYLENOL) 500 MG tablet Take 500 mg  by mouth every 6 (six) hours as needed.   Yes Historical Provider, MD     ALLERGIES:  No Known Allergies   SOCIAL HISTORY:  Social History   Social History  . Marital status: Married    Spouse name: N/A  . Number of children: N/A  . Years of education: N/A   Occupational History  . Not on file.   Social History Main Topics  . Smoking status: Current Every Day Smoker    Packs/day: 1.00    Years: 25.00    Types: Cigarettes  . Smokeless tobacco: Never Used  . Alcohol use No  . Drug use: No  . Sexual activity: Yes    Birth control/ protection: Surgical   Other Topics Concern  . Not on file   Social History Narrative  . No narrative on file    The patient currently resides (home / rehab facility / nursing home): Home  The patient normally is (ambulatory / bedbound) : Ambulatory   FAMILY HISTORY:  Family History  Problem Relation Age of Onset  . Diabetes Mother   . Dementia Mother   . Heart disease Father   . Dementia Father   . Heart disease Maternal Uncle   . Heart attack Maternal Uncle   . Heart disease Maternal Grandmother   . Heart attack Maternal Grandmother   . Lupus Other     niece  . Breast cancer Neg Hx   . Cancer Neg Hx   . COPD Neg Hx   . Stroke Neg Hx   . Bladder Cancer Neg Hx   .  Kidney cancer Neg Hx   . Prostate cancer Neg Hx     Otherwise negative/non-contributory.  REVIEW OF SYSTEMS:  Constitutional: denies any other weight loss, fever, chills, or sweats  Eyes: denies any other vision changes, history of eye injury  ENT: denies sore throat, hearing problems  Respiratory: denies shortness of breath, wheezing  Cardiovascular: denies chest pain, palpitations  Gastrointestinal: abdominal wall pain, N/V, and bowel function as per HPI Musculoskeletal: denies any other joint pains or cramps except as per HPI Skin: Denies any other rashes or skin discolorations  Neurological: denies any other headache, dizziness, weakness  Psychiatric: Denies  any other depression, anxiety   All other review of systems were otherwise negative   VITAL SIGNS:  @VSRANGES @     Height: 5\' 5"  (165.1 cm) Weight: 57.4 kg (126 lb 9.6 oz) BMI (Calculated): 21.1   PHYSICAL EXAM:  CConstitutional:  -- Normal body habitus  -- Awake, alert, and oriented x3  Eyes:  -- Pupils equally round and reactive to light  -- No scleral icterus  Ear, nose, throat:  -- No jugular venous distension -- No nasal drainage, bleeding Pulmonary:  -- No crackles  -- Equal breath sounds bilaterally -- Breathing non-labored at rest Cardiovascular:  -- S1, S2 present  -- No pericardial rubs  Gastrointestinal:  -- Abdomen soft with multifocal abdominal tenderness to palpation not limited to RLQ or LUQ/Left flank, nondistended, no guarding/rebound  -- No abdominal masses appreciated, pulsatile or otherwise  Musculoskeletal and Integumentary:  -- Wounds or skin discoloration: None -- Extremities: B/L UE and LE FROM, hands and feet warm, no edema  Neurologic:  -- Motor function: Intact and symmetric -- Sensation: Intact and symmetric  Labs:  CBC:  Lab Results  Component Value Date   WBC 6.8 10/17/2016   RBC 4.88 10/17/2016   HEMOGLOBIN 14.8 03/25/2015   BMP:  Lab Results  Component Value Date   GLUCOSE 87 10/05/2016   GLUCOSE 79 06/06/2012   CO2 29 10/17/2016   CO2 23 06/06/2012   BUN 11 10/31/2016   BUN 12 06/06/2012   CREATININE 0.68 10/31/2016   CREATININE 0.72 10/05/2016   CALCIUM 10.1 10/05/2016   CALCIUM 9.1 06/06/2012     Imaging studies:  CT Abdomen and Pelvis With and Without Contrast (11/12/2016) 1. No acute findings in the abdomen or pelvis to account for the patient's symptoms. Specifically, no urinary tract calculi no findings of urinary tract obstruction are noted at this time. 2. Normal appendix. 3. Aortic atherosclerosis (mild).   Assessment/Plan:  46 y.o. female with intermittent RLQ and constant Left back/flank pain x ~3 months of  likely musculoskeletal etiology without any apparent surgically correctable cause, complicated by co-morbidities including chronic back pain + scoliosis, history of acute sigmoid colonic diverticulitis with subsequently resolved small peri-colonic fluid collection (05/2015), endometriosis, tobacco abuse, and history of laparoscopic repair of incisional hernia (2006) and port site hernia by Dr. Jamal Collin (2008).   - no evidence of surgically correctable etiology for abdominal and back/flank pain  - continue heart-healthy diet with appropriate hydration and fiber to reduce risk of recurrent colonic diverticulitis   - encouraged smoking cessation, recommend screening colonoscopy at age 89   - return to clinic as needed  All of the above recommendations were discussed with patient, and all of patient's questions were answered to her expressed satisfaction.  -- Marilynne Drivers Rosana Hoes, MD, [place]: [facility] - Partnering for exceptional care. Office: [phone]

## 2016-12-25 NOTE — Patient Instructions (Signed)
Please call our office if you have questions or concerns.   

## 2016-12-27 ENCOUNTER — Encounter: Payer: Self-pay | Admitting: Family Medicine

## 2016-12-27 ENCOUNTER — Ambulatory Visit (INDEPENDENT_AMBULATORY_CARE_PROVIDER_SITE_OTHER): Payer: 59 | Admitting: Family Medicine

## 2016-12-27 VITALS — BP 112/60 | HR 84 | Temp 97.5°F | Resp 16 | Wt 126.9 lb

## 2016-12-27 DIAGNOSIS — R197 Diarrhea, unspecified: Secondary | ICD-10-CM

## 2016-12-27 DIAGNOSIS — R1031 Right lower quadrant pain: Secondary | ICD-10-CM | POA: Diagnosis not present

## 2016-12-27 DIAGNOSIS — R1012 Left upper quadrant pain: Secondary | ICD-10-CM | POA: Diagnosis not present

## 2016-12-27 DIAGNOSIS — R195 Other fecal abnormalities: Secondary | ICD-10-CM | POA: Diagnosis not present

## 2016-12-27 NOTE — Assessment & Plan Note (Signed)
Refer to GI 

## 2016-12-27 NOTE — Patient Instructions (Signed)
Let's get the stool studies Do not eat any more lettuce until you read about the recall and report about contaminated lettuce Check with your grocery store and restaurant to see if your food was affected Let's have you see the GI doctor Keep me posted

## 2016-12-27 NOTE — Progress Notes (Signed)
BP 112/60   Pulse 84   Temp 97.5 F (36.4 C) (Oral)   Resp 16   Wt 126 lb 14.4 oz (57.6 kg)   SpO2 96%   BMI 21.12 kg/m    Subjective:    Patient ID: Kathryn Fuller, female    DOB: Apr 25, 1971, 46 y.o.   MRN: [id number]  HPI: [name] is a 46 y.o. female  Chief Complaint  Patient presents with  . Follow-up    Talk about surgeron    HPI  She has eaten lettuce, packaged, premixed; 2 days ago and then started to have abdominal cramps, pain and diarrhea; did not look to see if any blood; it was 3 am and didn't look; woke her out of sleep  She has not had a colonoscopy; when I wanted her to go, patient says that the girl in [place] said she was too young and insurance wouldn't cover it so she didn't go through with it; she had one bloody stool out of the three (hemoccult cards); she has been having left sided pain for a good solid month she says  She has seen two surgeons about her abdominal pain; sore on the left side and the right side; they don't think she has a hernia she says; she believes something is going on; she circled the area that bulges out with a pen for him   Dr. Burt Knack is the one who diagnosed her with diverticulitis; she was hospitalized in Sept and October 2016 IMPRESSION: 1. Acute sigmoid diverticulitis with small peridiverticular abscess. No evidence of bowel obstruction. 2. The appendix appears normal. 3. Previous hysterectomy.   Electronically Signed   By: Richardean Sale M.D.   On: 06/09/2015 16:53 ----------------------------------- IMPRESSION: Near-complete resolution of the previously seen changes of diverticulitis. Particularly, the small fluid collection has nearly completely resolved. No sizable abscess is seen.  No new focal abnormality is noted.   Electronically Signed   By: Inez Catalina M.D.   On: 06/16/2015 14:34  ------------------------------------ IMPRESSION: No acute abnormality seen in the abdomen or  pelvis.   Electronically Signed   By: Marijo Conception, M.D.   On: 10/15/2016 11:39  Depression screen [facility] 2/9 12/27/2016 12/17/2016 11/29/2016 10/17/2016 10/05/2016  Decreased Interest 0 0 0 0 0  Down, Depressed, Hopeless 0 0 0 0 0  PHQ - 2 Score 0 0 0 0 0    Relevant past medical, surgical, family and social history reviewed Past Medical History:  Diagnosis Date  . Abdominal aortic atherosclerosis ([place]) 12/17/2016   Noted on CT scan March 2018  . Diverticulitis   . Diverticulosis of colon 10/16   Hospitalized   . Dyslipidemia 06/05/2015  . Endometriosis   . Pneumonia   . Scoliosis 1981   Treated at the [facility] in [place], [place]. Reported 30 rotation.  . Tobacco abuse 03/27/2015   Past Surgical History:  Procedure Laterality Date  . ABDOMINAL HYSTERECTOMY  2004   TAH-BSO  . [place]  2006  . INGUINAL HERNIA REPAIR Right 2008   Dr. Jamal Collin   Family History  Problem Relation Age of Onset  . Diabetes Mother   . Dementia Mother   . Heart disease Father   . Dementia Father   . Heart disease Maternal Uncle   . Heart attack Maternal Uncle   . Heart disease Maternal Grandmother   . Heart attack Maternal Grandmother   . Lupus Other     niece  . Breast  cancer Neg Hx   . Cancer Neg Hx   . COPD Neg Hx   . Stroke Neg Hx   . Bladder Cancer Neg Hx   . Kidney cancer Neg Hx   . Prostate cancer Neg Hx    Social History  Substance Use Topics  . Smoking status: Current Every Day Smoker    Packs/day: 1.00    Years: 25.00    Types: Cigarettes  . Smokeless tobacco: Never Used  . Alcohol use No    Interim medical history since last visit reviewed. Allergies and medications reviewed  Review of Systems Per HPI unless specifically indicated above     Objective:    BP 112/60   Pulse 84   Temp 97.5 F (36.4 C) (Oral)   Resp 16   Wt 126 lb 14.4 oz (57.6 kg)   SpO2 96%   BMI 21.12 kg/m   Wt Readings from Last 3 Encounters:  12/27/16 126 lb  14.4 oz (57.6 kg)  12/25/16 126 lb 9.6 oz (57.4 kg)  12/17/16 126 lb 12.8 oz (57.5 kg)    Physical Exam  Constitutional: She appears well-developed and well-nourished. No distress.  Weight is stable  Eyes: EOM are normal. No scleral icterus.  Neck: No thyromegaly present.  Cardiovascular: Normal rate and regular rhythm.   Pulmonary/Chest: Effort normal and breath sounds normal.  Abdominal: Soft. Bowel sounds are normal. She exhibits no distension and no abdominal bruit.  Visible and palpable bulging deformity along RIGHT lower quadrant; abdomen is distended, not hard  Neurological: She is alert. She displays no tremor.  Skin: Skin is warm and dry. She is not diaphoretic. No pallor.  Aged, wrinkled beyond years consistent with years of cigarette smoking  Psychiatric: She has a normal mood and affect.      Assessment & Plan:   Problem List Items Addressed This Visit      Other   Right lower quadrant pain    Refer to GI; she has seen two surgeons who do not think this is hernia      Heme positive stool    Refer to GI      Relevant Orders   Ambulatory referral to Gastroenterology    Other Visit Diagnoses    Diarrhea of presumed infectious origin    -  Primary   with recent recalls in thew news; will get stool culture; to ER if red flags   Relevant Orders   Stool Culture   Left upper quadrant pain       Relevant Orders   Stool Culture   Ambulatory referral to Gastroenterology      Follow up plan: No Follow-up on file.  An after-visit summary was printed and given to the patient at [place].  Please see the patient instructions which may contain other information and recommendations beyond what is mentioned above in the assessment and plan.  No orders of the defined types were placed in this encounter.   Orders Placed This Encounter  Procedures  . Stool Culture  . Ambulatory referral to Gastroenterology

## 2016-12-29 NOTE — Assessment & Plan Note (Signed)
Refer to GI; she has seen two surgeons who do not think this is hernia

## 2017-01-22 ENCOUNTER — Ambulatory Visit (INDEPENDENT_AMBULATORY_CARE_PROVIDER_SITE_OTHER): Payer: 59 | Admitting: Gastroenterology

## 2017-01-22 ENCOUNTER — Other Ambulatory Visit: Payer: Self-pay

## 2017-01-22 VITALS — BP 100/67 | HR 73 | Temp 97.4°F | Ht 65.0 in | Wt 127.2 lb

## 2017-01-22 DIAGNOSIS — R1084 Generalized abdominal pain: Secondary | ICD-10-CM | POA: Diagnosis not present

## 2017-01-22 DIAGNOSIS — K921 Melena: Secondary | ICD-10-CM | POA: Diagnosis not present

## 2017-01-22 DIAGNOSIS — R195 Other fecal abnormalities: Secondary | ICD-10-CM

## 2017-01-22 DIAGNOSIS — M7918 Myalgia, other site: Secondary | ICD-10-CM

## 2017-01-22 NOTE — Progress Notes (Signed)
Gastroenterology Consultation  Referring Provider:     Arnetha Courser, MD Primary Care Physician:  Arnetha Courser, MD Primary Gastroenterologist:  Dr. Allen Norris     Reason for Consultation:     Abdominal pain        HPI:   Kathryn Fuller is a 46 y.o. y/o female referred for consultation & management of Abdominal pain by Dr. Sanda Klein, Satira Anis, MD.  This patient comes in today with a report of abdominal pain with diarrhea. The patient has a history of diverticulitis back in 2016. The patient was recommended to have a colonoscopy in the past but she was told that it would not be covered by insurance. There is report that the patient had positive Hemoccult stool card and she was reporting some left-sided pain from month at that time. The patient now has been having left upper quadrant pain with right lower quadrant pain. The patient has been seen by surgery twice for her abdominal pain which she reports to be mostly in the right side and is a burning/pulling pain. The patient has diarrhea but the diarrhea is very intermittent and usually occurs only once a week. The patient states she drinks a lot of dairy products and ice cream on a daily basis. There is no report of any unexplained weight loss and the patient states that she has weight gain. There is no report of any black stools or bloody stools. The patient states that the abdominal pain is helped sometimes by Tylenol but nothing else. She also reports that she has chronic back pain.  Past Medical History:  Diagnosis Date  . Abdominal aortic atherosclerosis ([place]) 12/17/2016   Noted on CT scan March 2018  . Diverticulitis   . Diverticulosis of colon 10/16   Hospitalized   . Dyslipidemia 06/05/2015  . Endometriosis   . Pneumonia   . Scoliosis 1981   Treated at the [facility] in [place], [place]. Reported 30 rotation.  . Tobacco abuse 03/27/2015    Past Surgical History:  Procedure Laterality Date  . ABDOMINAL HYSTERECTOMY  2004     TAH-BSO  . [place]  2006  . INGUINAL HERNIA REPAIR Right 2008   Dr. Jamal Collin    Prior to Admission medications   Medication Sig Start Date End Date Taking? Authorizing Provider  acetaminophen (TYLENOL) 500 MG tablet Take 500 mg by mouth every 6 (six) hours as needed.    [provider]    Family History  Problem Relation Age of Onset  . Diabetes Mother   . Dementia Mother   . Heart disease Father   . Dementia Father   . Heart disease Maternal Uncle   . Heart attack Maternal Uncle   . Heart disease Maternal Grandmother   . Heart attack Maternal Grandmother   . Lupus Other        niece  . Breast cancer Neg Hx   . Cancer Neg Hx   . COPD Neg Hx   . Stroke Neg Hx   . Bladder Cancer Neg Hx   . Kidney cancer Neg Hx   . Prostate cancer Neg Hx      Social History  Substance Use Topics  . Smoking status: Current Every Day Smoker    Packs/day: 1.00    Years: 25.00    Types: Cigarettes  . Smokeless tobacco: Never Used  . Alcohol use No    Allergies as of 01/22/2017  . (No Known Allergies)  Review of Systems:    All systems reviewed and negative except where noted in HPI.   Physical Exam:  There were no vitals taken for this visit. No LMP recorded. Patient has had a hysterectomy. Psych:  Alert and cooperative. Normal mood and affect. General:   Alert,  Well-developed, well-nourished, pleasant and cooperative in NAD Head:  Normocephalic and atraumatic. Eyes:  Sclera clear, no icterus.   Conjunctiva pink. Ears:  Normal auditory acuity. Nose:  No deformity, discharge, or lesions. Mouth:  No deformity or lesions,oropharynx pink & moist. Neck:  Supple; no masses or thyromegaly. Lungs:  Respirations even and unlabored.  Clear throughout to auscultation.   No wheezes, crackles, or rhonchi. No acute distress. Heart:  Regular rate and rhythm; no murmurs, clicks, rubs, or gallops. Abdomen:  Normal bowel sounds.  No bruits.  Soft, With tenderness to  1 finger palpation while flexing the abdominal wall muscles, hepatosplenomegaly or hernias noted.  No guarding or rebound tenderness.  Positive Carnett sign.   Rectal:  Deferred.  Msk:  Symmetrical without gross deformities.  Good, equal movement & strength bilaterally. Pulses:  Normal pulses noted. Extremities:  No clubbing or edema.  No cyanosis. Neurologic:  Alert and oriented x3;  grossly normal neurologically. Skin:  Intact without significant lesions or rashes.  No jaundice. Lymph Nodes:  No significant cervical adenopathy. Psych:  Alert and cooperative. Normal mood and affect.  Imaging Studies: No results found.  Assessment and Plan:   Kathryn Fuller is a 46 y.o. y/o female who comes in with abdominal wall pain that is reproducible without palpation of the abdominal wall just by raising the leg 6 inches above the exam table. The pain is then exacerbated greatly by 1 finger palpation over the affected muscle. The patient's diarrhea which is intermittent and only occurs once or twice a week is likely environmental he mediated and she has been told to try to avoid milk and dairy products for one week to see if her symptoms improve. The patient will also be set up for colonoscopy due to her findings of blood in her stool. I have discussed risks & benefits which include, but are not limited to, bleeding, infection, perforation & drug reaction.  The patient agrees with this plan & written consent will be obtained.       Lucilla Lame, MD. [facility]   Note: This dictation was prepared with Dragon dictation along with smaller phrase technology. Any transcriptional errors that result from this process are unintentional.

## 2017-01-23 ENCOUNTER — Encounter: Payer: Self-pay | Admitting: *Deleted

## 2017-01-23 ENCOUNTER — Telehealth: Payer: Self-pay | Admitting: Gastroenterology

## 2017-01-23 NOTE — Telephone Encounter (Signed)
01/23/17 [employer] website for Colonoscopy 60600 / K92.1 has been approved. Auth Ref #: [id number].

## 2017-01-30 NOTE — Discharge Instructions (Signed)

## 2017-01-31 ENCOUNTER — Ambulatory Visit: Payer: 59 | Admitting: Anesthesiology

## 2017-01-31 ENCOUNTER — Encounter
Admission: RE | Disposition: A | Discharge status: Home / Self Care | Payer: Self-pay | Source: Ambulatory Visit | Attending: Gastroenterology

## 2017-01-31 ENCOUNTER — Ambulatory Visit
Admission: RE | Admit: 2017-01-31 | Discharge: 2017-01-31 | Disposition: A | Discharge status: Home / Self Care | Payer: 59 | Source: Ambulatory Visit | Attending: Gastroenterology | Admitting: Gastroenterology

## 2017-01-31 DIAGNOSIS — M810 Age-related osteoporosis without current pathological fracture: Secondary | ICD-10-CM | POA: Diagnosis not present

## 2017-01-31 DIAGNOSIS — F17210 Nicotine dependence, cigarettes, uncomplicated: Secondary | ICD-10-CM | POA: Diagnosis not present

## 2017-01-31 DIAGNOSIS — K635 Polyp of colon: Secondary | ICD-10-CM | POA: Diagnosis not present

## 2017-01-31 DIAGNOSIS — K921 Melena: Secondary | ICD-10-CM | POA: Diagnosis not present

## 2017-01-31 DIAGNOSIS — K640 First degree hemorrhoids: Secondary | ICD-10-CM | POA: Insufficient documentation

## 2017-01-31 DIAGNOSIS — Z840 Family history of diseases of the skin and subcutaneous tissue: Secondary | ICD-10-CM | POA: Insufficient documentation

## 2017-01-31 DIAGNOSIS — R195 Other fecal abnormalities: Secondary | ICD-10-CM | POA: Diagnosis not present

## 2017-01-31 DIAGNOSIS — D125 Benign neoplasm of sigmoid colon: Secondary | ICD-10-CM

## 2017-01-31 DIAGNOSIS — Z833 Family history of diabetes mellitus: Secondary | ICD-10-CM | POA: Insufficient documentation

## 2017-01-31 DIAGNOSIS — E785 Hyperlipidemia, unspecified: Secondary | ICD-10-CM | POA: Diagnosis not present

## 2017-01-31 DIAGNOSIS — I739 Peripheral vascular disease, unspecified: Secondary | ICD-10-CM | POA: Diagnosis not present

## 2017-01-31 DIAGNOSIS — Z90710 Acquired absence of both cervix and uterus: Secondary | ICD-10-CM | POA: Insufficient documentation

## 2017-01-31 DIAGNOSIS — M419 Scoliosis, unspecified: Secondary | ICD-10-CM | POA: Diagnosis not present

## 2017-01-31 DIAGNOSIS — Z820 Family history of epilepsy and other diseases of the nervous system: Secondary | ICD-10-CM | POA: Insufficient documentation

## 2017-01-31 HISTORY — DX: Age-related osteoporosis without current pathological fracture: M81.0

## 2017-01-31 HISTORY — PX: COLONOSCOPY WITH PROPOFOL: SHX5780

## 2017-01-31 LAB — HM COLONOSCOPY

## 2017-01-31 SURGERY — COLONOSCOPY WITH PROPOFOL
Anesthesia: Monitor Anesthesia Care | Wound class: Contaminated

## 2017-01-31 SURGERY — COLONOSCOPY WITH PROPOFOL
Anesthesia: Choice

## 2017-01-31 MED ORDER — LIDOCAINE HCL (CARDIAC) 20 MG/ML IV SOLN
INTRAVENOUS | Status: DC | PRN
  Administered 2017-01-31: 50 mg via INTRAVENOUS

## 2017-01-31 MED ORDER — PROPOFOL 10 MG/ML IV BOLUS
INTRAVENOUS | Status: DC | PRN
Start: 2017-01-31 — End: 2017-01-31
  Administered 2017-01-31 (×4): 50 mg via INTRAVENOUS
  Administered 2017-01-31: 100 mg via INTRAVENOUS

## 2017-01-31 MED ORDER — LACTATED RINGERS IV SOLN
INTRAVENOUS | Status: DC | PRN
  Administered 2017-01-31: 10:00:00 via INTRAVENOUS

## 2017-01-31 MED ORDER — ACETAMINOPHEN 325 MG PO TABS
650.0000 mg | ORAL_TABLET | Freq: Once | ORAL | Status: AC
  Administered 2017-01-31: 650 mg via ORAL

## 2017-01-31 MED ORDER — STERILE WATER FOR IRRIGATION IR SOLN
Status: DC | PRN
  Administered 2017-01-31: 11:00:00

## 2017-01-31 SURGICAL SUPPLY — 23 items

## 2017-01-31 NOTE — Op Note (Signed)
[facility] Patient Name: Kathryn Fuller Procedure Date: 01/31/2017 10:43 AM MRN: [id number] Account #: [id number] Date of Birth: 14-Jan-1971 Admit Type: Outpatient Age: 46 Room: [facility] OR ROOM 01 Gender: Female Note Status: Finalized Procedure:            Colonoscopy Indications:          Heme positive stool Providers:            Lucilla Lame MD, MD Referring MD:         Arnetha Courser (Referring MD) Medicines:            Propofol per Anesthesia Complications:        No immediate complications. Procedure:            Pre-Anesthesia Assessment:                       - Prior to the procedure, a History and Physical was                        performed, and patient medications and allergies were                        reviewed. The patient's tolerance of previous                        anesthesia was also reviewed. The risks and benefits of                        the procedure and the sedation options and risks were                        discussed with the patient. All questions were                        answered, and informed consent was obtained. Prior                        Anticoagulants: The patient has taken no previous                        anticoagulant or antiplatelet agents. ASA Grade                        Assessment: II - A patient with mild systemic disease.                        After reviewing the risks and benefits, the patient was                        deemed in satisfactory condition to undergo the                        procedure.                       After obtaining informed consent, the colonoscope was                        passed under direct vision. Throughout the procedure,  the patient's blood pressure, pulse, and oxygen                        saturations were monitored continuously. The Olympus                        PCF H180AL Colonoscope (S#: [id number]) was introduced   through the anus and advanced to the the cecum,                        identified by appendiceal orifice and ileocecal valve.                        The colonoscopy was performed without difficulty. The                        patient tolerated the procedure well. The quality of                        the bowel preparation was excellent. Findings:      The perianal and digital rectal examinations were normal.      A 8 mm polyp was found in the sigmoid colon. The polyp was pedunculated.       The polyp was removed with a hot snare. Resection and retrieval were       complete.      Non-bleeding internal hemorrhoids were found during retroflexion. The       hemorrhoids were Grade I (internal hemorrhoids that do not prolapse). Impression:           - One 8 mm polyp in the sigmoid colon, removed with a                        hot snare. Resected and retrieved.                       - Non-bleeding internal hemorrhoids. Recommendation:       - Discharge patient to home.                       - Resume previous diet.                       - Continue present medications.                       - Await pathology results.                       - Repeat colonoscopy in 5 years if polyp adenoma and 10                        years if hyperplastic Procedure Code(s):    --- Professional ---                       [phone], Colonoscopy, flexible; with removal of tumor(s),                        polyp(s), or other lesion(s) by snare technique Diagnosis Code(s):    --- Professional ---  R19.5, Other fecal abnormalities                       D12.5, Benign neoplasm of sigmoid colon CPT copyright 2016 American Medical Association. All rights reserved. The codes documented in this report are preliminary and upon coder review may  be revised to meet current compliance requirements. Lucilla Lame MD, MD 01/31/2017 11:08:18 AM This report has been signed electronically. Number of Addenda: 0 Note  Initiated On: 01/31/2017 10:43 AM Scope Withdrawal Time: 0 hours 7 minutes 51 seconds  Total Procedure Duration: 0 hours 13 minutes 59 seconds       [facility]

## 2017-01-31 NOTE — Anesthesia Postprocedure Evaluation (Signed)
Anesthesia Post Note  Patient: Kathryn Fuller  Procedure(s) Performed: Procedure(s) (LRB): COLONOSCOPY WITH PROPOFOL (N/A)  Patient location during evaluation: PACU Anesthesia Type: MAC Level of consciousness: awake and alert Pain management: pain level controlled Vital Signs Assessment: post-procedure vital signs reviewed and stable Respiratory status: spontaneous breathing, nonlabored ventilation and respiratory function stable Cardiovascular status: stable Postop Assessment: no signs of nausea or vomiting Anesthetic complications: no    Veda Canning

## 2017-01-31 NOTE — Anesthesia Procedure Notes (Signed)
Procedure Name: MAC Performed by: Kimbley Sprague Pre-anesthesia Checklist: Patient identified, Emergency Drugs available, Suction available, Timeout performed and Patient being monitored Patient Re-evaluated:Patient Re-evaluated prior to inductionOxygen Delivery Method: Nasal cannula Placement Confirmation: positive ETCO2     

## 2017-01-31 NOTE — Transfer of Care (Signed)
Immediate Anesthesia Transfer of Care Note  Patient: Kathryn Fuller  Procedure(s) Performed: Procedure(s): COLONOSCOPY WITH PROPOFOL (N/A)  Patient Location: PACU  Anesthesia Type: MAC  Level of Consciousness: awake, alert  and patient cooperative  Airway and Oxygen Therapy: Patient Spontanous Breathing and Patient connected to supplemental oxygen  Post-op Assessment: Post-op Vital signs reviewed, Patient's Cardiovascular Status Stable, Respiratory Function Stable, Patent Airway and No signs of Nausea or vomiting  Post-op Vital Signs: Reviewed and stable  Complications: No apparent anesthesia complications

## 2017-01-31 NOTE — H&P (Signed)
Lucilla Lame, MD [address] [place], [place] [address] Phone:[phone] Fax : [phone]  Primary Care Physician:  Arnetha Courser, MD Primary Gastroenterologist:  Dr. Allen Norris  Pre-Procedure History & Physical: HPI:  Kathryn Fuller is a 46 y.o. female is here for an colonoscopy.   Past Medical History:  Diagnosis Date  . Abdominal aortic atherosclerosis ([place]) 12/17/2016   Noted on CT scan March 2018  . Diverticulitis   . Diverticulosis of colon 10/16   Hospitalized   . Dyslipidemia 06/05/2015  . Endometriosis   . Osteoporosis   . Pneumonia   . Scoliosis 1981   Treated at the [facility] in [place], [place]. Reported 30 rotation.  . Tobacco abuse 03/27/2015    Past Surgical History:  Procedure Laterality Date  . ABDOMINAL HYSTERECTOMY  2004   TAH-BSO  . [place]  2006  . INGUINAL HERNIA REPAIR Right 2008   Dr. Jamal Collin    Prior to Admission medications   Medication Sig Start Date End Date Taking? Authorizing Provider  acetaminophen (TYLENOL) 500 MG tablet Take 500 mg by mouth every 6 (six) hours as needed.    [provider]    Allergies as of 01/22/2017  . (No Known Allergies)    Family History  Problem Relation Age of Onset  . Diabetes Mother   . Dementia Mother   . Heart disease Father   . Dementia Father   . Heart disease Maternal Uncle   . Heart attack Maternal Uncle   . Heart disease Maternal Grandmother   . Heart attack Maternal Grandmother   . Lupus Other        niece  . Breast cancer Neg Hx   . Cancer Neg Hx   . COPD Neg Hx   . Stroke Neg Hx   . Bladder Cancer Neg Hx   . Kidney cancer Neg Hx   . Prostate cancer Neg Hx     Social History   Social History  . Marital status: Married    Spouse name: N/A  . Number of children: N/A  . Years of education: N/A   Occupational History  . Not on file.   Social History Main Topics  . Smoking status: Current Every Day Smoker    Packs/day:  1.00    Years: 27.00    Types: Cigarettes  . Smokeless tobacco: Never Used  . Alcohol use No  . Drug use: No  . Sexual activity: Yes    Birth control/ protection: Surgical   Other Topics Concern  . Not on file   Social History Narrative  . No narrative on file    Review of Systems: See HPI, otherwise negative ROS  Physical Exam: BP 95/75   Pulse 62   Temp 97.7 F (36.5 C) (Temporal)   Ht 5\' 5"  (1.651 m)   Wt 124 lb (56.2 kg)   SpO2 100%   BMI 20.63 kg/m  General:   Alert,  pleasant and cooperative in NAD Head:  Normocephalic and atraumatic. Neck:  Supple; no masses or thyromegaly. Lungs:  Clear throughout to auscultation.    Heart:  Regular rate and rhythm. Abdomen:  Soft, nontender and nondistended. Normal bowel sounds, without guarding, and without rebound.   Neurologic:  Alert and  oriented x4;  grossly normal neurologically.  Impression/Plan: Kathryn Fuller is here for an colonoscopy to be performed for heme positive stools  Risks, benefits, limitations, and alternatives regarding  colonoscopy have been reviewed with  the patient.  Questions have been answered.  All parties agreeable.   Lucilla Lame, MD  01/31/2017, 10:18 AM

## 2017-01-31 NOTE — Anesthesia Preprocedure Evaluation (Signed)
Anesthesia Evaluation  Patient identified by MRN, date of birth, ID band Patient awake    Reviewed: Allergy & Precautions, H&P , NPO status   Airway Mallampati: II  TM Distance: >3 FB     Dental   Chipped front left tooth, none loose:   Pulmonary Current Smoker,    breath sounds clear to auscultation       Cardiovascular + Peripheral Vascular Disease   Rhythm:regular Rate:Normal  Hyperlipidemia    Neuro/Psych    GI/Hepatic History of diverticulitis   Endo/Other  Osteoporosis  Renal/GU      Musculoskeletal   Abdominal   Peds  Hematology   Anesthesia Other Findings   Reproductive/Obstetrics                            Anesthesia Physical Anesthesia Plan  ASA: II  Anesthesia Plan: MAC   Post-op Pain Management:    Induction:   Airway Management Planned:   Additional Equipment:   Intra-op Plan:   Post-operative Plan:   Informed Consent: I have reviewed the patients History and Physical, chart, labs and discussed the procedure including the risks, benefits and alternatives for the proposed anesthesia with the patient or authorized representative who has indicated his/her understanding and acceptance.   Dental Advisory Given  Plan Discussed with: CRNA  Anesthesia Plan Comments:         Anesthesia Quick Evaluation

## 2017-02-01 ENCOUNTER — Encounter: Payer: Self-pay | Admitting: Gastroenterology

## 2017-02-05 ENCOUNTER — Encounter: Payer: Self-pay | Admitting: Gastroenterology

## 2017-09-20 ENCOUNTER — Ambulatory Visit (INDEPENDENT_AMBULATORY_CARE_PROVIDER_SITE_OTHER): Payer: 59 | Admitting: Family Medicine

## 2017-09-20 ENCOUNTER — Telehealth: Payer: Self-pay | Admitting: Family Medicine

## 2017-09-20 ENCOUNTER — Ambulatory Visit
Admission: RE | Admit: 2017-09-20 | Discharge: 2017-09-20 | Disposition: A | Discharge status: Home / Self Care | Payer: 59 | Source: Ambulatory Visit | Attending: Family Medicine | Admitting: Family Medicine

## 2017-09-20 ENCOUNTER — Encounter: Payer: Self-pay | Admitting: Family Medicine

## 2017-09-20 VITALS — BP 120/70 | HR 81 | Temp 97.7°F | Resp 16 | Ht 65.0 in | Wt 124.0 lb

## 2017-09-20 DIAGNOSIS — R1084 Generalized abdominal pain: Secondary | ICD-10-CM | POA: Diagnosis not present

## 2017-09-20 DIAGNOSIS — R197 Diarrhea, unspecified: Secondary | ICD-10-CM | POA: Diagnosis not present

## 2017-09-20 DIAGNOSIS — E559 Vitamin D deficiency, unspecified: Secondary | ICD-10-CM | POA: Diagnosis not present

## 2017-09-20 LAB — POCT URINALYSIS DIPSTICK
Bilirubin, UA: NEGATIVE
Glucose, UA: NEGATIVE
Ketones, UA: NEGATIVE
LEUKOCYTES UA: NEGATIVE
NITRITE UA: NEGATIVE
PROTEIN UA: NEGATIVE
Spec Grav, UA: 1.015 (ref 1.010–1.025)
Urobilinogen, UA: NEGATIVE E.U./dL — AB
pH, UA: 6 (ref 5.0–8.0)

## 2017-09-20 IMAGING — CR DG ABDOMEN ACUTE W/ 1V CHEST
1 series · 4 of 4 positions shown · non-contrast
Comparison: [DATE]

CLINICAL DATA: Generalized abdominal pain, nausea and diarrhea.

EXAM:
DG ABDOMEN ACUTE W/ 1V CHEST

[Series 1: dg abd acute w/chest · 0.14mm/px · 4 of 4 slices shown]
[im 1/4]
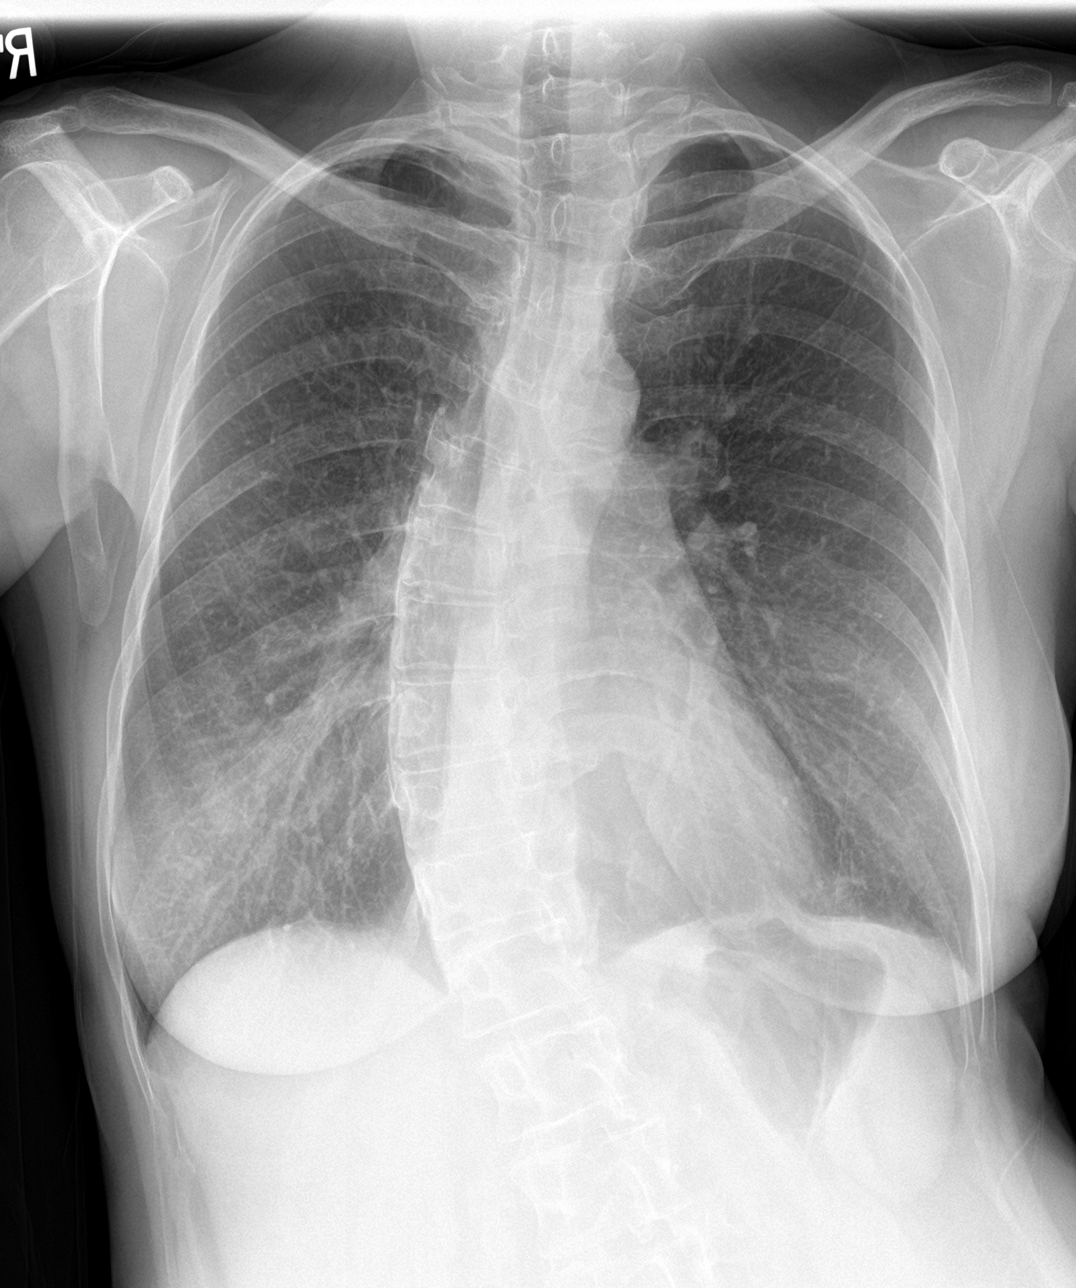
[im 2/4]
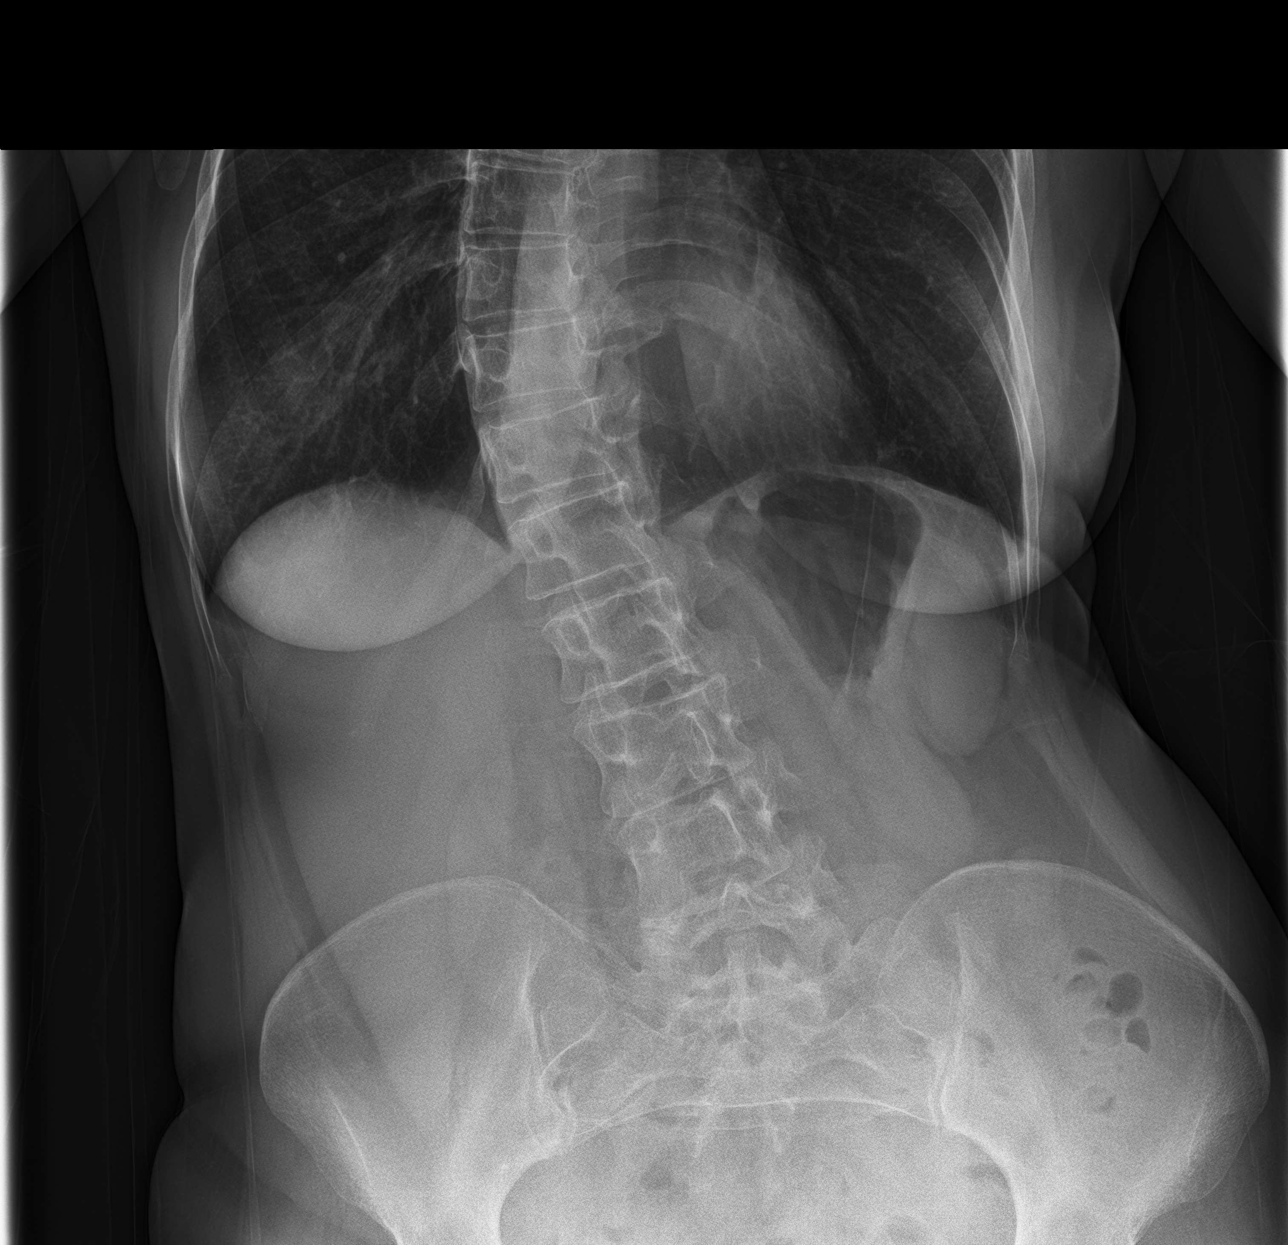
[im 3/4]
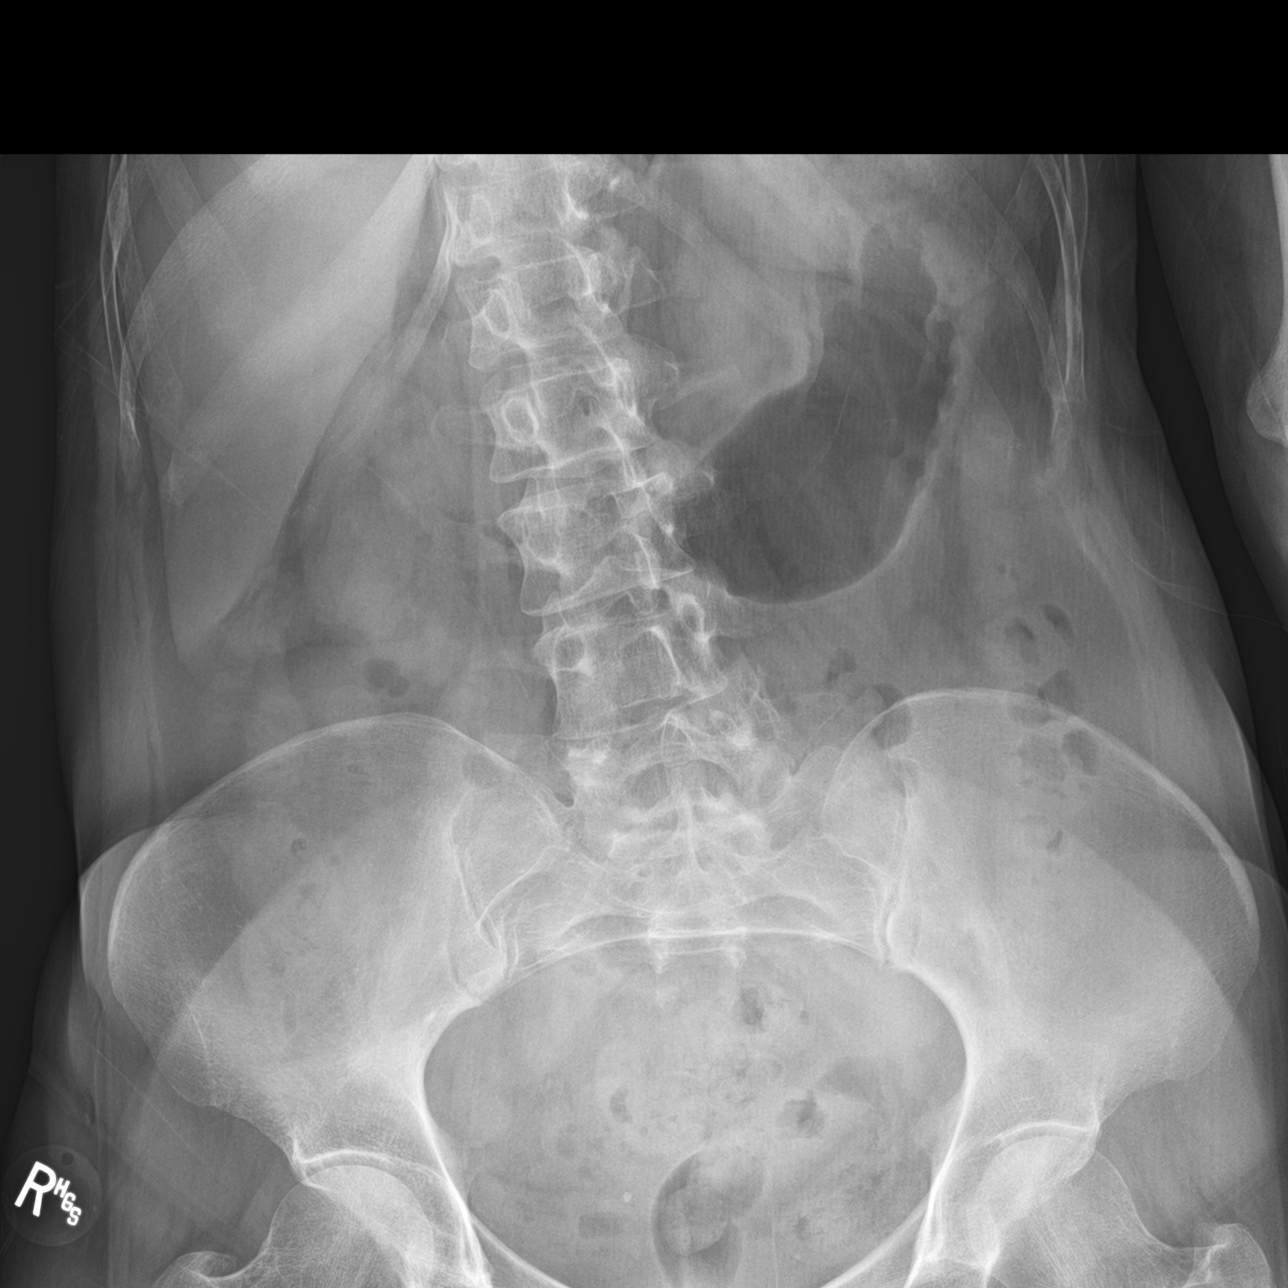
[im 4/4]
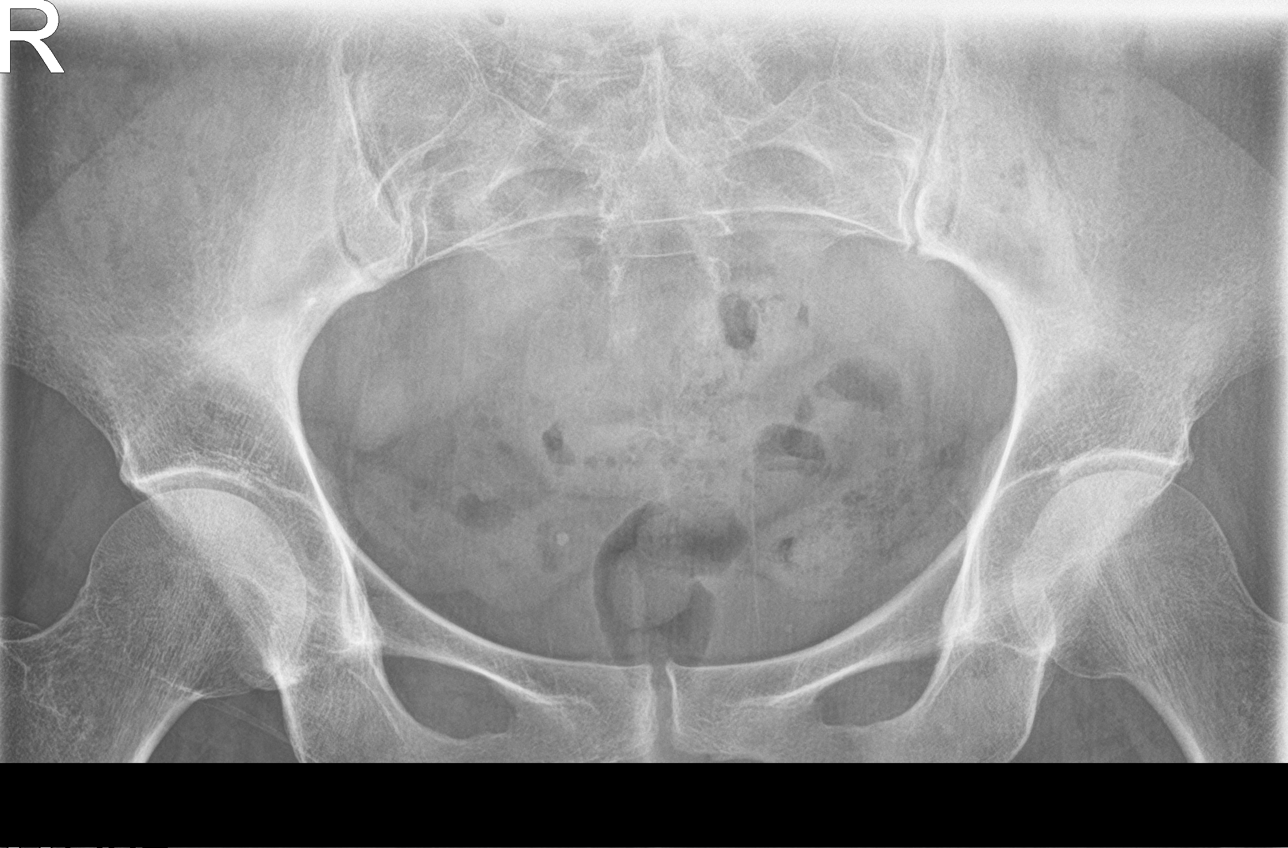

[4 of 4 positions shown; findings below may reference images not displayed]

FINDINGS: Grossly unchanged cardiac silhouette and mediastinal contours.
Feeling opacities overlying the bilateral lower lungs are unchanged
favored represent line breast tissues. No discrete focal airspace
opacities. No pleural effusion or pneumothorax. No evidence of
edema.

Paucity of bowel gas without evidence of enteric obstruction.
Unremarkable colonic stool burden.

No pneumoperitoneum, pneumatosis or portal venous gas.

Several phleboliths overlie the lower pelvis bilaterally. Otherwise,
no definitive intra-abdominal calcifications.

Moderate scoliotic curvature of the thoracolumbar spine with
dominant cranial component convex to the right, grossly unchanged.
IMPRESSION: 1. No acute cardiopulmonary disease.
2. Paucity of bowel gas without evidence of enteric obstruction.
Unremarkable colonic stool burden.

## 2017-09-20 MED ORDER — BENTYL 20 MG PO TABS: 20.0000 mg | ORAL_TABLET | Freq: Three times a day (TID) | ORAL | 0 refills | Status: DC | PRN

## 2017-09-20 NOTE — Telephone Encounter (Signed)
Looks like this RX was sent DAW, pt is requesting generic. Please advise. Thanks!

## 2017-09-20 NOTE — Telephone Encounter (Signed)
Copied from [place] [id number]. Topic: Quick Communication - See Telephone Encounter >> Sep 20, 2017  3:25 PM Bea Graff, NT wrote: CRM for notification. See Telephone encounter for: Pt calling to receive xray results. The CRM in from where the office called pt today does not state that it is ok for [facility] Nurse Triage is ok to give pt the results. Please call pt.  09/20/17.

## 2017-09-20 NOTE — Telephone Encounter (Signed)
Generic is fine 

## 2017-09-20 NOTE — Telephone Encounter (Signed)
Kathryn Fuller requesting generic brand of Bentyl

## 2017-09-20 NOTE — Telephone Encounter (Signed)
Pt wants to know if the Bentyl can be generic. Please advise.

## 2017-09-20 NOTE — Patient Instructions (Addendum)
- Go to the [facility] today to have Abdominal Xrays performed.  - Take Bentyl as needed for abdominal cramping/pain.  Please avoid Aleve or Ibuprofen for the time being.  - Your appointment with Dr. Allen Norris (GI/Stomach Specialist) is on Monday 09/23/2017 at 1:45pm in [facility]   Abdominal Pain, Adult Many things can cause belly (abdominal) pain. Most times, belly pain is not dangerous. Many cases of belly pain can be watched and treated at home. Sometimes belly pain is serious, though. Your doctor will try to find the cause of your belly pain. Follow these instructions at home:  Take over-the-counter and prescription medicines only as told by your doctor. Do not take medicines that help you poop (laxatives) unless told to by your doctor.  Drink enough fluid to keep your pee (urine) clear or pale yellow.  Watch your belly pain for any changes.  Keep all follow-up visits as told by your doctor. This is important. Contact a doctor if:  Your belly pain changes or gets worse.  You are not hungry, or you lose weight without trying.  You are having trouble pooping (constipated) or have watery poop (diarrhea) for more than 2-3 days.  You have pain when you pee or poop.  Your belly pain wakes you up at night.  Your pain gets worse with meals, after eating, or with certain foods.  You are throwing up and cannot keep anything down.  You have a fever. Get help right away if:  Your pain does not go away as soon as your doctor says it should.  You cannot stop throwing up.  Your pain is only in areas of your belly, such as the right side or the left lower part of the belly.  You have bloody or black poop, or poop that looks like tar.  You have very bad pain, cramping, or bloating in your belly.  You have signs of not having enough fluid or water in your body (dehydration), such as: ? Dark pee, very little pee, or no pee. ? Cracked lips. ? Dry mouth. ? Sunken  eyes. ? Sleepiness. ? Weakness. This information is not intended to replace advice given to you by your health care provider. Make sure you discuss any questions you have with your health care provider. Document Released: 02/13/2008 Document Revised: 03/16/2016 Document Reviewed: 02/08/2016 Elsevier Interactive Patient Education  2018 [employer].  Diarrhea, Adult Diarrhea is when you have loose and water poop (stool) often. Diarrhea can make you feel weak and cause you to get dehydrated. Dehydration can make you tired and thirsty, make you have a dry mouth, and make it so you pee (urinate) less often. Diarrhea often lasts 2-3 days. However, it can last longer if it is a sign of something more serious. It is important to treat your diarrhea as told by your doctor. Follow these instructions at home: Eating and drinking  Follow these recommendations as told by your doctor:  Take an oral rehydration solution (ORS). This is a drink that is sold at pharmacies and stores.  Drink clear fluids, such as: ? Water. ? Ice chips. ? Diluted fruit juice. ? Low-calorie sports drinks.  Eat bland, easy-to-digest foods in small amounts as you are able. These foods include: ? Bananas. ? Applesauce. ? Rice. ? Low-fat (lean) meats. ? Toast. ? Crackers.  Avoid drinking fluids that have a lot of sugar or caffeine in them.  Avoid alcohol.  Avoid spicy or fatty foods.  General instructions  Drink enough fluid to keep your pee (urine) clear or pale yellow.  Wash your hands often. If you cannot use soap and water, use hand sanitizer.  Make sure that all people in your home wash their hands well and often.  Take over-the-counter and prescription medicines only as told by your doctor.  Rest at home while you get better.  Watch your condition for any changes.  Take a warm bath to help with any burning or pain from having diarrhea.  Keep all follow-up visits as told by your doctor. This is  important. Contact a doctor if:  You have a fever.  Your diarrhea gets worse.  You have new symptoms.  You cannot keep fluids down.  You feel light-headed or dizzy.  You have a headache.  You have muscle cramps. Get help right away if:  You have chest pain.  You feel very weak or you pass out (faint).  You have bloody or black poop or poop that look like tar.  You have very bad pain, cramping, or bloating in your belly (abdomen).  You have trouble breathing or you are breathing very quickly.  Your heart is beating very quickly.  Your skin feels cold and clammy.  You feel confused.  You have signs of dehydration, such as: ? Dark pee, hardly any pee, or no pee. ? Cracked lips. ? Dry mouth. ? Sunken eyes. ? Sleepiness. ? Weakness. This information is not intended to replace advice given to you by your health care provider. Make sure you discuss any questions you have with your health care provider. Document Released: 02/13/2008 Document Revised: 03/16/2016 Document Reviewed: 05/03/2015 Elsevier Interactive Patient Education  2018 [employer] Choices to Help Relieve Diarrhea, Adult When you have diarrhea, the foods you eat and your eating habits are very important. Choosing the right foods and drinks can help:  Relieve diarrhea.  Replace lost fluids and nutrients.  Prevent dehydration.  What general guidelines should I follow? Relieving diarrhea  Choose foods with less than 2 g or .07 oz. of fiber per serving.  Limit fats to less than 8 tsp (38 g or 1.34 oz.) a day.  Avoid the following: ? Foods and beverages sweetened with high-fructose corn syrup, honey, or sugar alcohols such as xylitol, sorbitol, and mannitol. ? Foods that contain a lot of fat or sugar. ? Fried, greasy, or spicy foods. ? High-fiber grains, breads, and cereals. ? Raw fruits and vegetables.  Eat foods that are rich in probiotics. These foods include dairy products such as  yogurt and fermented milk products. They help increase healthy bacteria in the stomach and intestines (gastrointestinal tract, or GI tract).  If you have lactose intolerance, avoid dairy products. These may make your diarrhea worse.  Take medicine to help stop diarrhea (antidiarrheal medicine) only as told by your health care provider. Replacing nutrients  Eat small meals or snacks every 3-4 hours.  Eat bland foods, such as white rice, toast, or baked potato, until your diarrhea starts to get better. Gradually reintroduce nutrient-rich foods as tolerated or as told by your health care provider. This includes: ? Well-cooked protein foods. ? Peeled, seeded, and soft-cooked fruits and vegetables. ? Low-fat dairy products.  Take vitamin and mineral supplements as told by your health care provider. Preventing dehydration   Start by sipping water or a special solution to prevent dehydration (oral rehydration solution, ORS). Urine that is clear or pale yellow means that you are getting enough fluid.  Try  to drink at least 8-10 cups of fluid each day to help replace lost fluids.  You may add other liquids in addition to water, such as clear juice or decaffeinated sports drinks, as tolerated or as told by your health care provider.  Avoid drinks with caffeine, such as coffee, tea, or soft drinks.  Avoid alcohol. What foods are recommended? The items listed may not be a complete list. Talk with your health care provider about what dietary choices are best for you. Grains White rice. White, [place], or pita breads (fresh or toasted), including plain rolls, buns, or bagels. White pasta. Saltine, soda, or graham crackers. Pretzels. Low-fiber cereal. Cooked cereals made with water (such as cornmeal, farina, or cream cereals). Plain muffins. Matzo. Melba toast. Zwieback. Vegetables Potatoes (without the skin). Most well-cooked and canned vegetables without skins or seeds. Tender  lettuce. Fruits Apple sauce. Fruits canned in juice. Cooked apricots, cherries, grapefruit, peaches, pears, or plums. Fresh bananas and cantaloupe. Meats and other protein foods Baked or boiled chicken. Eggs. Tofu. Fish. Seafood. Smooth nut butters. Ground or well-cooked tender beef, ham, veal, lamb, pork, or poultry. Dairy Plain yogurt, kefir, and unsweetened liquid yogurt. Lactose-free milk, buttermilk, skim milk, or soy milk. Low-fat or nonfat hard cheese. Beverages Water. Low-calorie sports drinks. Fruit juices without pulp. Strained tomato and vegetable juices. Decaffeinated teas. Sugar-free beverages not sweetened with sugar alcohols. Oral rehydration solutions, if approved by your health care provider. Seasoning and other foods Bouillon, broth, or soups made from recommended foods. What foods are not recommended? The items listed may not be a complete list. Talk with your health care provider about what dietary choices are best for you. Grains Whole grain, whole wheat, bran, or rye breads, rolls, pastas, and crackers. Wild or brown rice. Whole grain or bran cereals. Barley. Oats and oatmeal. Corn tortillas or taco shells. Granola. Popcorn. Vegetables Raw vegetables. Fried vegetables. Cabbage, broccoli, Brussels sprouts, artichokes, baked beans, beet greens, corn, kale, legumes, peas, sweet potatoes, and yams. Potato skins. Cooked spinach and cabbage. Fruits Dried fruit, including raisins and dates. Raw fruits. Stewed or dried prunes. Canned fruits with syrup. Meat and other protein foods Fried or fatty meats. Deli meats. Chunky nut butters. Nuts and seeds. Beans and lentils. Berniece Salines. Hot dogs. Sausage. Dairy High-fat cheeses. Whole milk, chocolate milk, and beverages made with milk, such as milk shakes. Half-and-half. Cream. sour cream. Ice cream. Beverages Caffeinated beverages (such as coffee, tea, soda, or energy drinks). Alcoholic beverages. Fruit juices with pulp. Prune juice. Soft  drinks sweetened with high-fructose corn syrup or sugar alcohols. High-calorie sports drinks. Fats and oils Butter. Cream sauces. Margarine. Salad oils. Plain salad dressings. Olives. Avocados. Mayonnaise. Sweets and desserts Sweet rolls, doughnuts, and sweet breads. Sugar-free desserts sweetened with sugar alcohols such as xylitol and sorbitol. Seasoning and other foods Honey. Hot sauce. Chili powder. Gravy. Cream-based or milk-based soups. Pancakes and waffles. Summary  When you have diarrhea, the foods you eat and your eating habits are very important.  Make sure you get at least 8-10 cups of fluid each day, or enough to keep your urine clear or pale yellow.  Eat bland foods and gradually reintroduce healthy, nutrient-rich foods as tolerated, or as told by your health care provider.  Avoid high-fiber, fried, greasy, or spicy foods. This information is not intended to replace advice given to you by your health care provider. Make sure you discuss any questions you have with your health care provider. Document Released: 11/17/2003 Document Revised: 08/24/2016 Document  Reviewed: 08/24/2016 Elsevier Interactive Patient Education  [employer].

## 2017-09-20 NOTE — Progress Notes (Signed)
Name: Kathryn Fuller   MRN: [id number]    DOB: 02-21-1971   Date:09/20/2017       Progress Note  Subjective  Chief Complaint  Chief Complaint  Patient presents with  . Abdominal Pain    hx of diverticulitis, pain for 2 days  . Diarrhea    HPI  Patient presents with concern for generalized abdominal pain with associated bilateral low back and vaginal pain x1 day.  She reports loose stools 10-20 episodes in the last 24 hours - when she has BM her pain is worse.  Has history of Diverticulitis, but this does not feel similar.  Endorses fatigue and chills.  Mild nausea, still passing gas.  No blood in stool, no dark and tarry stool, vomiting, decreased appetite, denies feeling weak.  Denies vaginal discharge, no vaginal bleeding - pt is s/p hysterectomy.  She does have history of hematuria - was evaluated by Dr. Erlene Quan 11/23/2016 for the same and was advised likely no GU related.  Seen by Dr. Allen Norris in May 2018 for abdominal pain, blood in stool, and diarrhea. He advised she avoid dairy products for 1 week to see if symptoms improved.  He felt symptoms were more likely environmental.  He performed colonoscopy on 01/31/2017 - told to return in 10 years.  Had polyp removed that was negative for dysplasia.  Follow Up with Dr. Allen Norris - only PRN.  Patient Active Problem List   Diagnosis Date Noted  . Abnormal feces   . Polyp of sigmoid colon   . Abdominal aortic atherosclerosis ([place]) 12/17/2016  . Heme positive stool 10/17/2016  . Hypertriglyceridemia 10/11/2016  . Vitamin D deficiency 10/11/2016  . Elevated hematocrit 10/11/2016  . Right lower quadrant pain 10/05/2016  . Breast cancer screening 10/05/2016  . Preventative health care 10/05/2016  . Osteoporosis 08/09/2015  . Diverticulitis large intestine 06/09/2015  . Dyslipidemia 06/05/2015  . Tobacco abuse 03/27/2015    Social History   Tobacco Use  . Smoking status: Current Every Day Smoker    Packs/day: 1.00    Years: 27.00   Pack years: 27.00    Types: Cigarettes  . Smokeless tobacco: Never Used  Substance Use Topics  . Alcohol use: No     Current Outpatient Medications:  .  acetaminophen (TYLENOL) 500 MG tablet, Take 500 mg by mouth every 6 (six) hours as needed., Disp: , Rfl:  .  BENTYL 20 MG tablet, Take 1 tablet (20 mg total) by mouth 3 (three) times daily as needed for spasms., Disp: 20 tablet, Rfl: 0  No Known Allergies  ROS  Constitutional: Negative for fever or weight change.  Respiratory: Negative for cough and shortness of breath.   Cardiovascular: Negative for chest pain or palpitations.  Gastrointestinal: See HPI  Musculoskeletal: Negative for gait problem or joint swelling.  Skin: Negative for rash.  Neurological: Negative for dizziness or headache.  No other specific complaints in a complete review of systems (except as listed in HPI above).  Objective  Vitals:   09/20/17 0916  BP: 120/70  Pulse: 81  Resp: 16  Temp: 97.7 F (36.5 C)  TempSrc: Oral  SpO2: 99%  Weight: 124 lb (56.2 kg)  Height: 5\' 5"  (1.651 m)   Body mass index is 20.63 kg/m.  Nursing Note and Vital Signs reviewed.  Physical Exam  Constitutional: Patient appears well-developed and well-nourished. Thin. No distress.  HEENT: head atraumatic, normocephalic Cardiovascular: Normal rate, regular rhythm, S1/S2 present.  No murmur or rub heard. No BLE  edema. Pulmonary/Chest: Effort normal and breath sounds clear. No respiratory distress or retractions. Abdominal: Soft with tenderness throughout; no distension or HSM, bowel sounds present x4 quadrants.  No CVA Tenderness. MSK: Low back exhibist tenderness over the lumbar musculature, no spinal tenderness/crepitus, no hip tenderness.  No joint effusions.  Psychiatric: Patient has a normal mood and affect. behavior is normal. Judgment and thought content normal.  Recent Results (from the past 2160 hour(s))  POCT urinalysis dipstick     Status: Abnormal    Collection Time: 09/20/17 10:00 AM  Result Value Ref Range   Color, UA yellow    Clarity, UA clear    Glucose, UA negative    Bilirubin, UA negative    Ketones, UA negattive    Spec Grav, UA 1.015 1.010 - 1.025   Blood, UA large    pH, UA 6.0 5.0 - 8.0   Protein, UA negative    Urobilinogen, UA negative (A) 0.2 or 1.0 E.U./dL   Nitrite, UA negative    Leukocytes, UA Negative Negative   Appearance clear    Odor none     Assessment & Plan  1. Diarrhea, unspecified type - CBC w/Diff/Platelet - COMPLETE METABOLIC PANEL WITH GFR - DG Abd Acute W/Chest; Future - VITAMIN D 25 Hydroxy (Vit-D Deficiency, Fractures) - Gastrointestinal Pathogen Panel PCR - Ambulatory referral to Gastroenterology - Tylenol PRN for pain - avoid NSAIDS for the time being.  2. Generalized abdominal pain - POCT urinalysis dipstick - Has hx hematuria - was evaluated by Urology 11/23/2016 after negative work-up was referred back to PCP.  No further action to be taken on this at this time. - BENTYL 20 MG tablet; Take 1 tablet (20 mg total) by mouth 3 (three) times daily as needed for spasms.  Dispense: 20 tablet; Refill: 0 - DG Abd Acute W/Chest; Future - Lipase - Ambulatory referral to Gastroenterology  3. Vitamin D deficiency - VITAMIN D 25 Hydroxy (Vit-D Deficiency, Fractures)  -Red flags and when to present for emergency care or RTC including fever >101.49F, chest pain, shortness of breath, new/worsening/un-resolving symptoms, distended abdomen, severe abdominal pain, vomiting, malaise, other red flags per AVS reviewed with patient at time of visit. Follow up and care instructions discussed and provided in AVS.

## 2017-09-20 NOTE — Telephone Encounter (Signed)
Pharmacy notified.

## 2017-09-20 NOTE — Telephone Encounter (Signed)
Copied from [place] [id number]. Topic: Quick Communication - See Telephone Encounter >> Sep 20, 2017 11:53 AM Ahmed Prima L wrote: CRM for notification. See Telephone encounter for:   09/20/17.  [facility] stated the script that was faxed over for BENTYL 20 MG tablet, they do not have the exact brand. They want to know if they can do the generic.  Fax number [phone]

## 2017-09-21 LAB — CBC WITH DIFFERENTIAL/PLATELET
BASOS PCT: 0.5 %
Basophils Absolute: 48 cells/uL (ref 0–200)
EOS PCT: 0.7 %
Eosinophils Absolute: 67 cells/uL (ref 15–500)
HEMATOCRIT: 44.3 % (ref 35.0–45.0)
HEMOGLOBIN: 15.1 g/dL (ref 11.7–15.5)
Lymphs Abs: 2054 cells/uL (ref 850–3900)
MCH: 30.9 pg (ref 27.0–33.0)
MCHC: 34.1 g/dL (ref 32.0–36.0)
MCV: 90.8 fL (ref 80.0–100.0)
MPV: 9.7 fL (ref 7.5–12.5)
Monocytes Relative: 7.9 %
NEUTROS ABS: 6672 {cells}/uL (ref 1500–7800)
Neutrophils Relative %: 69.5 %
Platelets: 290 10*3/uL (ref 140–400)
RBC: 4.88 10*6/uL (ref 3.80–5.10)
RDW: 12 % (ref 11.0–15.0)
Total Lymphocyte: 21.4 %
WBC mixed population: 758 cells/uL (ref 200–950)
WBC: 9.6 10*3/uL (ref 3.8–10.8)

## 2017-09-21 LAB — COMPLETE METABOLIC PANEL WITH GFR
AG Ratio: 1.8 (calc) (ref 1.0–2.5)
ALBUMIN MSPROF: 4.3 g/dL (ref 3.6–5.1)
ALT: 10 U/L (ref 6–29)
AST: 12 U/L (ref 10–35)
Alkaline phosphatase (APISO): 88 U/L (ref 33–115)
BUN: 10 mg/dL (ref 7–25)
CALCIUM: 9.4 mg/dL (ref 8.6–10.2)
CO2: 29 mmol/L (ref 20–32)
Chloride: 103 mmol/L (ref 98–110)
Creat: 0.63 mg/dL (ref 0.50–1.10)
GFR, EST NON AFRICAN AMERICAN: 107 mL/min/{1.73_m2} (ref >=60)
GFR, Est African American: 124 mL/min/{1.73_m2} (ref >=60)
GLOBULIN: 2.4 g/dL (ref 1.9–3.7)
Glucose, Bld: 82 mg/dL (ref 65–99)
Potassium: 4.7 mmol/L (ref 3.5–5.3)
SODIUM: 139 mmol/L (ref 135–146)
Total Bilirubin: 0.5 mg/dL (ref 0.2–1.2)
Total Protein: 6.7 g/dL (ref 6.1–8.1)

## 2017-09-21 LAB — LIPASE: Lipase: 22 U/L (ref 7–60)

## 2017-09-21 LAB — VITAMIN D 25 HYDROXY (VIT D DEFICIENCY, FRACTURES): Vit D, 25-Hydroxy: 23 ng/mL — ABNORMAL LOW (ref 30–100)

## 2017-09-23 ENCOUNTER — Ambulatory Visit (INDEPENDENT_AMBULATORY_CARE_PROVIDER_SITE_OTHER): Payer: 59 | Admitting: Gastroenterology

## 2017-09-23 ENCOUNTER — Encounter: Payer: Self-pay | Admitting: Gastroenterology

## 2017-09-23 VITALS — BP 104/65 | HR 76 | Ht 64.0 in | Wt 124.0 lb

## 2017-09-23 DIAGNOSIS — R1084 Generalized abdominal pain: Secondary | ICD-10-CM

## 2017-09-23 MED ORDER — CYCLOBENZAPRINE HCL 10 MG PO TABS: 10.0000 mg | ORAL_TABLET | Freq: Three times a day (TID) | ORAL | 0 refills | Status: DC | PRN

## 2017-09-23 NOTE — Progress Notes (Signed)
Primary Care Physician: Arnetha Courser, MD  Primary Gastroenterologist:  Dr. Lucilla Lame  Chief Complaint  Patient presents with  . Abdominal Pain    HPI: Kathryn Fuller is a 47 y.o. female here with a history of abdominal pain with diarrhea and hematochezia. The patient has also been seen by urology for hematuria. The patient had a colonoscopy with a polyp that was seen and removed but did not have any adenomatous changes in it. The patient recently presented to her PCP with generalized abdominal pain that radiated to her back with vaginal pain. She reported at that time that her stools were so frequent that she was having 10-20 bowel movements per day. The patient had a KUB that did not show any acute processes. The patient reports that the diarrhea has now completely resolved and she is having small hard stools. The patient continues to report that the abdominal pain is in the mid abdomen and goes down to her pelvic bone and bilaterally on the sides of her abdomen. The patient has not had any weight loss with these symptoms. The patient also reports that she suffers from scoliosis and chronic back pain. She did not get the dicyclomine because there was a confusion in the pharmacy about whether it was generic or not.   Current Outpatient Medications  Medication Sig Dispense Refill  . acetaminophen (TYLENOL) 500 MG tablet Take 500 mg by mouth every 6 (six) hours as needed.    . BENTYL 20 MG tablet Take 1 tablet (20 mg total) by mouth 3 (three) times daily as needed for spasms. 20 tablet 0  . cyclobenzaprine (FLEXERIL) 10 MG tablet Take 1 tablet (10 mg total) by mouth 3 (three) times daily as needed for muscle spasms. 30 tablet 0   No current facility-administered medications for this visit.     Allergies as of 09/23/2017  . (No Known Allergies)    ROS:  General: Negative for anorexia, weight loss, fever, chills, fatigue, weakness. ENT: Negative for hoarseness, difficulty  swallowing , nasal congestion. CV: Negative for chest pain, angina, palpitations, dyspnea on exertion, peripheral edema.  Respiratory: Negative for dyspnea at rest, dyspnea on exertion, cough, sputum, wheezing.  GI: See history of present illness. GU:  Negative for dysuria, hematuria, urinary incontinence, urinary frequency, nocturnal urination.  Endo: Negative for unusual weight change.    Physical Examination:   BP 104/65   Pulse 76   Ht 5\' 4"  (1.626 m)   Wt 124 lb (56.2 kg)   BMI 21.28 kg/m   General: Well-nourished, well-developed in no acute distress.  Eyes: No icterus. Conjunctivae pink. Mouth: Oropharyngeal mucosa moist and pink , no lesions erythema or exudate. Lungs: Clear to auscultation bilaterally. Non-labored. Heart: Regular rate and rhythm, no murmurs rubs or gallops.  Abdomen: Bowel sounds are normal, positive tenderness to 1 finger palpation while flexing the abdominal wall muscles., nondistended, no hepatosplenomegaly or masses, no abdominal bruits or hernia , no rebound or guarding.   Extremities: No lower extremity edema. No clubbing or deformities. Neuro: Alert and oriented x 3.  Grossly intact. Skin: Warm and dry, no jaundice.   Psych: Alert and cooperative, normal mood and affect.  Labs:    Imaging Studies: Dg Abd Acute W/chest  Result Date: 09/20/2017 CLINICAL DATA:  Generalized abdominal pain, nausea and diarrhea. EXAM: DG ABDOMEN ACUTE W/ 1V CHEST COMPARISON:  09/19/2012 FINDINGS: Grossly unchanged cardiac silhouette and mediastinal contours. Feeling opacities overlying the bilateral lower lungs are unchanged favored  represent line breast tissues. No discrete focal airspace opacities. No pleural effusion or pneumothorax. No evidence of edema. Paucity of bowel gas without evidence of enteric obstruction. Unremarkable colonic stool burden. No pneumoperitoneum, pneumatosis or portal venous gas. Several phleboliths overlie the lower pelvis bilaterally. Otherwise,  no definitive intra-abdominal calcifications. Moderate scoliotic curvature of the thoracolumbar spine with dominant cranial component convex to the right, grossly unchanged. IMPRESSION: 1. No acute cardiopulmonary disease. 2. Paucity of bowel gas without evidence of enteric obstruction. Unremarkable colonic stool burden. Electronically Signed   By: Sandi Mariscal M.D.   On: 09/20/2017 13:13    Assessment and Plan:   Kathryn Fuller is a 47 y.o. y/o female who comes in today with abdominal pain that appears to be musculoskeletal in nature. The patient may have had a bacterial or viral infection that caused her to have excessive now movements that resulted in her pushing and straining. This is what likely resulted in her abdominal wall pain. The patient has been using a heating pad at home and states that it helps her abdominal pain. She will continue this and also started on Flexeril. The patient has also been told to increase fiber in her diet and add MiraLAX if her stools continue to be hard and pellet-like.    Lucilla Lame, MD. Marval Regal   Note: This dictation was prepared with Dragon dictation along with smaller phrase technology. Any transcriptional errors that result from this process are unintentional.

## 2017-10-07 ENCOUNTER — Ambulatory Visit (INDEPENDENT_AMBULATORY_CARE_PROVIDER_SITE_OTHER): Payer: 59 | Admitting: Family Medicine

## 2017-10-07 ENCOUNTER — Encounter: Payer: Self-pay | Admitting: Family Medicine

## 2017-10-07 VITALS — BP 106/68 | HR 90 | Temp 97.6°F | Ht 65.03 in | Wt 121.9 lb

## 2017-10-07 DIAGNOSIS — Z0000 Encounter for general adult medical examination without abnormal findings: Secondary | ICD-10-CM

## 2017-10-07 DIAGNOSIS — M818 Other osteoporosis without current pathological fracture: Secondary | ICD-10-CM | POA: Diagnosis not present

## 2017-10-07 NOTE — Assessment & Plan Note (Signed)
USPSTF grade A and B recommendations reviewed with patient; age-appropriate recommendations, preventive care, screening tests, etc discussed and encouraged; healthy living encouraged; see AVS for patient education given to patient  

## 2017-10-07 NOTE — Assessment & Plan Note (Signed)
Order DEXA; calcium, fall prevention, vit D

## 2017-10-07 NOTE — Patient Instructions (Addendum)
I do encourage you to quit smoking Call [phone] to sign up for smoking cessation classes You can call 1-800-QUIT-NOW to talk with a smoking cessation coach  Osteoporosis Osteoporosis happens when your bones become thinner and weaker. Weak bones can break (fracture) more easily when you slip or fall. Bones most at risk of breaking are in the hip, wrist, and spine. Follow these instructions at home:  Get enough calcium and vitamin D. These nutrients are good for your bones.  Exercise as told by your doctor.  Do not use any tobacco products. This includes cigarettes, chewing tobacco, and electronic cigarettes. If you need help quitting, ask your doctor.  Limit the amount of alcohol you drink.  Take medicines only as told by your doctor.  Keep all follow-up visits as told by your doctor. This is important.  Take care at home to prevent falls. Some ways to do this are: ? Keep rooms well lit and tidy. ? Put safety rails on your stairs. ? Put a rubber mat in the bathroom and other places that are often wet or slippery. Get help right away if:  You fall.  You hurt yourself. This information is not intended to replace advice given to you by your health care provider. Make sure you discuss any questions you have with your health care provider. Document Released: 11/19/2011 Document Revised: 02/02/2016 Document Reviewed: 02/04/2014 Elsevier Interactive Patient Education  2018 [place] in the Home Falls can cause injuries. They can happen to people of all ages. There are many things you can do to make your home safe and to help prevent falls. What can I do on the outside of my home?  Regularly fix the edges of walkways and driveways and fix any cracks.  Remove anything that might make you trip as you walk through a door, such as a raised step or threshold.  Trim any bushes or trees on the path to your home.  Use bright outdoor lighting.  Clear any walking  paths of anything that might make someone trip, such as rocks or tools.  Regularly check to see if handrails are loose or broken. Make sure that both sides of any steps have handrails.  Any raised decks and porches should have guardrails on the edges.  Have any leaves, snow, or ice cleared regularly.  Use sand or salt on walking paths during winter.  Clean up any spills in your garage right away. This includes oil or grease spills. What can I do in the bathroom?  Use night lights.  Install grab bars by the toilet and in the tub and shower. Do not use towel bars as grab bars.  Use non-skid mats or decals in the tub or shower.  If you need to sit down in the shower, use a plastic, non-slip stool.  Keep the floor dry. Clean up any water that spills on the floor as soon as it happens.  Remove soap buildup in the tub or shower regularly.  Attach bath mats securely with double-sided non-slip rug tape.  Do not have throw rugs and other things on the floor that can make you trip. What can I do in the bedroom?  Use night lights.  Make sure that you have a light by your bed that is easy to reach.  Do not use any sheets or blankets that are too big for your bed. They should not hang down onto the floor.  Have a firm chair that has side arms.  You can use this for support while you get dressed.  Do not have throw rugs and other things on the floor that can make you trip. What can I do in the kitchen?  Clean up any spills right away.  Avoid walking on wet floors.  Keep items that you use a lot in easy-to-reach places.  If you need to reach something above you, use a strong step stool that has a grab bar.  Keep electrical cords out of the way.  Do not use floor polish or wax that makes floors slippery. If you must use wax, use non-skid floor wax.  Do not have throw rugs and other things on the floor that can make you trip. What can I do with my stairs?  Do not leave any items  on the stairs.  Make sure that there are handrails on both sides of the stairs and use them. Fix handrails that are broken or loose. Make sure that handrails are as long as the stairways.  Check any carpeting to make sure that it is firmly attached to the stairs. Fix any carpet that is loose or worn.  Avoid having throw rugs at the top or bottom of the stairs. If you do have throw rugs, attach them to the floor with carpet tape.  Make sure that you have a light switch at the top of the stairs and the bottom of the stairs. If you do not have them, ask someone to add them for you. What else can I do to help prevent falls?  Wear shoes that: ? Do not have high heels. ? Have rubber bottoms. ? Are comfortable and fit you well. ? Are closed at the toe. Do not wear sandals.  If you use a stepladder: ? Make sure that it is fully opened. Do not climb a closed stepladder. ? Make sure that both sides of the stepladder are locked into place. ? Ask someone to hold it for you, if possible.  Clearly mark and make sure that you can see: ? Any grab bars or handrails. ? First and last steps. ? Where the edge of each step is.  Use tools that help you move around (mobility aids) if they are needed. These include: ? Canes. ? Walkers. ? Scooters. ? Crutches.  Turn on the lights when you go into a dark area. Replace any light bulbs as soon as they burn out.  Set up your furniture so you have a clear path. Avoid moving your furniture around.  If any of your floors are uneven, fix them.  If there are any pets around you, be aware of where they are.  Review your medicines with your doctor. Some medicines can make you feel dizzy. This can increase your chance of falling. Ask your doctor what other things that you can do to help prevent falls. This information is not intended to replace advice given to you by your health care provider. Make sure you discuss any questions you have with your health care  provider. Document Released: 06/23/2009 Document Revised: 02/02/2016 Document Reviewed: 10/01/2014 Elsevier Interactive Patient Education  2018 [place] Maintenance, Female Adopting a healthy lifestyle and getting preventive care can go a long way to promote health and wellness. Talk with your health care provider about what schedule of regular examinations is right for you. This is a good chance for you to check in with your provider about disease prevention and staying healthy. In between checkups, there are plenty of  things you can do on your own. Experts have done a lot of research about which lifestyle changes and preventive measures are most likely to keep you healthy. Ask your health care provider for more information. Weight and diet Eat a healthy diet  Be sure to include plenty of vegetables, fruits, low-fat dairy products, and lean protein.  Do not eat a lot of foods high in solid fats, added sugars, or salt.  Get regular exercise. This is one of the most important things you can do for your health. ? Most adults should exercise for at least 150 minutes each week. The exercise should increase your heart rate and make you sweat (moderate-intensity exercise). ? Most adults should also do strengthening exercises at least twice a week. This is in addition to the moderate-intensity exercise.  Maintain a healthy weight  Body mass index (BMI) is a measurement that can be used to identify possible weight problems. It estimates body fat based on height and weight. Your health care provider can help determine your BMI and help you achieve or maintain a healthy weight.  For females 77 years of age and older: ? A BMI below 18.5 is considered underweight. ? A BMI of 18.5 to 24.9 is normal. ? A BMI of 25 to 29.9 is considered overweight. ? A BMI of 30 and above is considered obese.  Watch levels of cholesterol and blood lipids  You should start having your blood tested for  lipids and cholesterol at 48 years of age, then have this test every 5 years.  You may need to have your cholesterol levels checked more often if: ? Your lipid or cholesterol levels are high. ? You are older than 48 years of age. ? You are at high risk for heart disease.  Cancer screening Lung Cancer  Lung cancer screening is recommended for adults 86-23 years old who are at high risk for lung cancer because of a history of smoking.  A yearly low-dose CT scan of the lungs is recommended for people who: ? Currently smoke. ? Have quit within the past 15 years. ? Have at least a 30-pack-year history of smoking. A pack year is smoking an average of one pack of cigarettes a day for 1 year.  Yearly screening should continue until it has been 15 years since you quit.  Yearly screening should stop if you develop a health problem that would prevent you from having lung cancer treatment.  Breast Cancer  Practice breast self-awareness. This means understanding how your breasts normally appear and feel.  It also means doing regular breast self-exams. Let your health care provider know about any changes, no matter how small.  If you are in your 20s or 30s, you should have a clinical breast exam (CBE) by a health care provider every 1-3 years as part of a regular health exam.  If you are 51 or older, have a CBE every year. Also consider having a breast X-ray (mammogram) every year.  If you have a family history of breast cancer, talk to your health care provider about genetic screening.  If you are at high risk for breast cancer, talk to your health care provider about having an MRI and a mammogram every year.  Breast cancer gene (BRCA) assessment is recommended for women who have family members with BRCA-related cancers. BRCA-related cancers include: ? Breast. ? Ovarian. ? Tubal. ? Peritoneal cancers.  Results of the assessment will determine the need for genetic counseling and BRCA1 and  BRCA2 testing.  Cervical Cancer Your health care provider may recommend that you be screened regularly for cancer of the pelvic organs (ovaries, uterus, and vagina). This screening involves a pelvic examination, including checking for microscopic changes to the surface of your cervix (Pap test). You may be encouraged to have this screening done every 3 years, beginning at age 65.  For women ages 47-65, health care providers may recommend pelvic exams and Pap testing every 3 years, or they may recommend the Pap and pelvic exam, combined with testing for human papilloma virus (HPV), every 5 years. Some types of HPV increase your risk of cervical cancer. Testing for HPV may also be done on women of any age with unclear Pap test results.  Other health care providers may not recommend any screening for nonpregnant women who are considered low risk for pelvic cancer and who do not have symptoms. Ask your health care provider if a screening pelvic exam is right for you.  If you have had past treatment for cervical cancer or a condition that could lead to cancer, you need Pap tests and screening for cancer for at least 20 years after your treatment. If Pap tests have been discontinued, your risk factors (such as having a new sexual partner) need to be reassessed to determine if screening should resume. Some women have medical problems that increase the chance of getting cervical cancer. In these cases, your health care provider may recommend more frequent screening and Pap tests.  Colorectal Cancer  This type of cancer can be detected and often prevented.  Routine colorectal cancer screening usually begins at 48 years of age and continues through 48 years of age.  Your health care provider may recommend screening at an earlier age if you have risk factors for colon cancer.  Your health care provider may also recommend using home test kits to check for hidden blood in the stool.  A small camera at the  end of a tube can be used to examine your colon directly (sigmoidoscopy or colonoscopy). This is done to check for the earliest forms of colorectal cancer.  Routine screening usually begins at age 78.  Direct examination of the colon should be repeated every 5-10 years through 48 years of age. However, you may need to be screened more often if early forms of precancerous polyps or small growths are found.  Skin Cancer  Check your skin from head to toe regularly.  Tell your health care provider about any new moles or changes in moles, especially if there is a change in a mole's shape or color.  Also tell your health care provider if you have a mole that is larger than the size of a pencil eraser.  Always use sunscreen. Apply sunscreen liberally and repeatedly throughout the day.  Protect yourself by wearing long sleeves, pants, a wide-brimmed hat, and sunglasses whenever you are outside.  Heart disease, diabetes, and high blood pressure  High blood pressure causes heart disease and increases the risk of stroke. High blood pressure is more likely to develop in: ? People who have blood pressure in the high end of the normal range (130-139/85-89 mm Hg). ? People who are overweight or obese. ? People who are African American.  If you are 37-94 years of age, have your blood pressure checked every 3-5 years. If you are 66 years of age or older, have your blood pressure checked every year. You should have your blood pressure measured twice-once when you are at  a hospital or clinic, and once when you are not at a hospital or clinic. Record the average of the two measurements. To check your blood pressure when you are not at a hospital or clinic, you can use: ? An automated blood pressure machine at a pharmacy. ? A home blood pressure monitor.  If you are between 86 years and 70 years old, ask your health care provider if you should take aspirin to prevent strokes.  Have regular diabetes  screenings. This involves taking a blood sample to check your fasting blood sugar level. ? If you are at a normal weight and have a low risk for diabetes, have this test once every three years after 48 years of age. ? If you are overweight and have a high risk for diabetes, consider being tested at a younger age or more often. Preventing infection Hepatitis B  If you have a higher risk for hepatitis B, you should be screened for this virus. You are considered at high risk for hepatitis B if: ? You were born in a country where hepatitis B is common. Ask your health care provider which countries are considered high risk. ? Your parents were born in a high-risk country, and you have not been immunized against hepatitis B (hepatitis B vaccine). ? You have HIV or AIDS. ? You use needles to inject street drugs. ? You live with someone who has hepatitis B. ? You have had sex with someone who has hepatitis B. ? You get hemodialysis treatment. ? You take certain medicines for conditions, including cancer, organ transplantation, and autoimmune conditions.  Hepatitis C  Blood testing is recommended for: ? Everyone born from [birth year] through 1966. ? Anyone with known risk factors for hepatitis C.  Sexually transmitted infections (STIs)  You should be screened for sexually transmitted infections (STIs) including gonorrhea and chlamydia if: ? You are sexually active and are younger than 48 years of age. ? You are older than 48 years of age and your health care provider tells you that you are at risk for this type of infection. ? Your sexual activity has changed since you were last screened and you are at an increased risk for chlamydia or gonorrhea. Ask your health care provider if you are at risk.  If you do not have HIV, but are at risk, it may be recommended that you take a prescription medicine daily to prevent HIV infection. This is called pre-exposure prophylaxis (PrEP). You are considered at risk  if: ? You are sexually active and do not regularly use condoms or know the HIV status of your partner(s). ? You take drugs by injection. ? You are sexually active with a partner who has HIV.  Talk with your health care provider about whether you are at high risk of being infected with HIV. If you choose to begin PrEP, you should first be tested for HIV. You should then be tested every 3 months for as long as you are taking PrEP. Pregnancy  If you are premenopausal and you may become pregnant, ask your health care provider about preconception counseling.  If you may become pregnant, take 400 to 800 micrograms (mcg) of folic acid every day.  If you want to prevent pregnancy, talk to your health care provider about birth control (contraception). Osteoporosis and menopause  Osteoporosis is a disease in which the bones lose minerals and strength with aging. This can result in serious bone fractures. Your risk for osteoporosis can be identified using a  bone density scan.  If you are 69 years of age or older, or if you are at risk for osteoporosis and fractures, ask your health care provider if you should be screened.  Ask your health care provider whether you should take a calcium or vitamin D supplement to lower your risk for osteoporosis.  Menopause may have certain physical symptoms and risks.  Hormone replacement therapy may reduce some of these symptoms and risks. Talk to your health care provider about whether hormone replacement therapy is right for you. Follow these instructions at home:  Schedule regular health, dental, and eye exams.  Stay current with your immunizations.  Do not use any tobacco products including cigarettes, chewing tobacco, or electronic cigarettes.  If you are pregnant, do not drink alcohol.  If you are breastfeeding, limit how much and how often you drink alcohol.  Limit alcohol intake to no more than 1 drink per day for nonpregnant women. One drink  equals 12 ounces of beer, 5 ounces of wine, or 1 ounces of hard liquor.  Do not use street drugs.  Do not share needles.  Ask your health care provider for help if you need support or information about quitting drugs.  Tell your health care provider if you often feel depressed.  Tell your health care provider if you have ever been abused or do not feel safe at home. This information is not intended to replace advice given to you by your health care provider. Make sure you discuss any questions you have with your health care provider. Document Released: 03/12/2011 Document Revised: 02/02/2016 Document Reviewed: 05/31/2015 Elsevier Interactive Patient Education  [employer].

## 2017-10-07 NOTE — Progress Notes (Signed)
Patient ID: Kathryn Fuller, female   DOB: 08-06-1971, 47 y.o.   MRN: [id number]   Subjective:   Kathryn Fuller is a 47 y.o. female here for a complete physical exam  Interim issues since last visit: none  USPSTF grade A and B recommendations Depression:  Depression screen [facility] 2/9 10/07/2017 12/27/2016 12/17/2016 11/29/2016 10/17/2016  Decreased Interest 0 0 0 0 0  Down, Depressed, Hopeless 0 0 0 0 0  PHQ - 2 Score 0 0 0 0 0   Hypertension: BP Readings from Last 3 Encounters:  10/07/17 106/68  09/23/17 104/65  09/20/17 120/70   Obesity: Wt Readings from Last 3 Encounters:  10/07/17 121 lb 14.4 oz (55.3 kg)  09/23/17 124 lb (56.2 kg)  09/20/17 124 lb (56.2 kg)   BMI Readings from Last 3 Encounters:  10/07/17 20.27 kg/m  09/23/17 21.28 kg/m  09/20/17 20.63 kg/m    Skin cancer: nothing worrisome Lung cancer:  Smokes 1 ppd x 20 years; discussed screening after 54 when 30 pack years, until quit for 14 Breast cancer: no new lumps or bumps; mammo already scheduled Colorectal cancer: discussed 1 group started at 54; she had colonoscopy last year; next due 10 years later per patient; no fam hx  BRCA gene screening: family hx of breast and/or ovarian cancer and/or metastatic prostate cancer? no Cervical cancer screening: s/p hysterectomy HIV, hep B, hep C: patient not interested STD testing and prevention (chl/gon/syphilis): not interested Intimate partner violence: no abuse Contraception: n/a Osteoporosis: has osteoporosis; last DEXA 2016; never started bisphosphonate; getting calcium, vitamin, milk Fall prevention/vitamin D: discussed  Diet: pretty good eater Exercise: all day long moving at work, lifting, ladders (be careful) Alcohol: seldom Tobacco use: 1 ppd Aspirin: no taking Lipids: fasting except for coffee Lab Results  Component Value Date   CHOL 242 (H) 10/17/2016   CHOL 255 (H) 10/05/2016   CHOL 237 (H) 03/25/2015   Lab Results  Component Value Date   HDL 34 (L) 10/17/2016   HDL 37 (L) 10/05/2016   HDL 45 03/25/2015   Lab Results  Component Value Date   LDLCALC 149 (H) 10/17/2016   LDLCALC NOT CALC 10/05/2016   LDLCALC 168 (H) 03/25/2015   Lab Results  Component Value Date   TRIG 294 (H) 10/17/2016   TRIG 511 (H) 10/05/2016   TRIG 121 03/25/2015   Lab Results  Component Value Date   CHOLHDL 7.1 (H) 10/17/2016   CHOLHDL 6.9 (H) 10/05/2016   No results found for: LDLDIRECT Glucose:  Glucose  Date Value Ref Range Status  06/06/2012 79 65 - 99 mg/dL Final   Glucose, Bld  Date Value Ref Range Status  09/20/2017 82 65 - 99 mg/dL Final    Comment:    .            Fasting reference interval .   10/05/2016 87 65 - 99 mg/dL Final  06/09/2015 114 (H) 65 - 99 mg/dL Final    Past Medical History:  Diagnosis Date  . Abdominal aortic atherosclerosis (Index) 12/17/2016   Noted on CT scan March 2018  . Diverticulitis   . Diverticulosis of colon 10/16   Hospitalized   . Dyslipidemia 06/05/2015  . Endometriosis   . Osteoporosis   . Pneumonia   . Scoliosis 1981   Treated at the [facility] in [place], [place]. Reported 30 rotation.  . Tobacco abuse 03/27/2015   Past Surgical History:  Procedure Laterality Date  . ABDOMINAL HYSTERECTOMY  2004  TAH-BSO  . COLONOSCOPY WITH PROPOFOL N/A 01/31/2017   Procedure: COLONOSCOPY WITH PROPOFOL;  Surgeon: Lucilla Lame, MD;  Location: [facility];  Service: Endoscopy;  Laterality: N/A;  . [place]  2006  . INGUINAL HERNIA REPAIR Right 2008   Dr. Jamal Collin   Family History  Problem Relation Age of Onset  . Diabetes Mother   . Dementia Mother   . Heart disease Father   . Dementia Father   . Heart disease Maternal Uncle   . Heart attack Maternal Uncle   . Heart disease Maternal Grandmother   . Heart attack Maternal Grandmother   . Lupus Other        niece  . Breast cancer Neg Hx   . Cancer Neg Hx   . COPD Neg Hx   . Stroke Neg Hx   . Bladder  Cancer Neg Hx   . Kidney cancer Neg Hx   . Prostate cancer Neg Hx    Social History   Tobacco Use  . Smoking status: Current Every Day Smoker    Packs/day: 1.00    Years: 27.00    Pack years: 27.00    Types: Cigarettes  . Smokeless tobacco: Never Used  Substance Use Topics  . Alcohol use: No  . Drug use: No   Review of Systems  Objective:   Vitals:   10/07/17 0812  BP: 106/68  Pulse: 90  Temp: 97.6 F (36.4 C)  TempSrc: Oral  SpO2: 99%  Weight: 121 lb 14.4 oz (55.3 kg)  Height: 5' 5.03" (1.652 m)   Body mass index is 20.27 kg/m. Wt Readings from Last 3 Encounters:  10/07/17 121 lb 14.4 oz (55.3 kg)  09/23/17 124 lb (56.2 kg)  09/20/17 124 lb (56.2 kg)   Physical Exam  Constitutional: She appears well-developed and well-nourished.  HENT:  Head: Normocephalic and atraumatic.  Right Ear: Hearing, tympanic membrane, external ear and ear canal normal.  Left Ear: Hearing, tympanic membrane, external ear and ear canal normal.  Eyes: Conjunctivae and EOM are normal. Right eye exhibits no hordeolum. Left eye exhibits no hordeolum. No scleral icterus.  Neck: Carotid bruit is not present. No thyromegaly present.  Cardiovascular: Normal rate, regular rhythm, S1 normal, S2 normal and normal heart sounds.  No extrasystoles are present.  Pulmonary/Chest: Effort normal and breath sounds normal. No respiratory distress. Right breast exhibits no inverted nipple, no mass, no nipple discharge, no skin change and no tenderness. Left breast exhibits no inverted nipple, no mass, no nipple discharge, no skin change and no tenderness. Breasts are symmetrical.  Abdominal: Soft. Normal appearance and bowel sounds are normal. She exhibits no distension, no abdominal bruit, no pulsatile midline mass and no mass. There is no hepatosplenomegaly. There is no tenderness. No hernia.  Musculoskeletal: Normal range of motion. She exhibits no edema.  Lymphadenopathy:       Head (right side): No  submandibular adenopathy present.       Head (left side): No submandibular adenopathy present.    She has no cervical adenopathy.    She has no axillary adenopathy.  Neurological: She is alert. She displays no tremor. No cranial nerve deficit. She exhibits normal muscle tone. Gait normal.  Reflex Scores:      Patellar reflexes are 2+ on the right side and 2+ on the left side. Skin: Skin is warm and dry. No bruising and no ecchymosis noted. No cyanosis. No pallor.  Psychiatric: Her speech is normal and behavior is normal. Thought  content normal. Her mood appears not anxious. She does not exhibit a depressed mood.    Assessment/Plan:   Problem List Items Addressed This Visit      Musculoskeletal and Integument   Osteoporosis    Order DEXA; calcium, fall prevention, vit D      Relevant Orders   DG Bone Density     Other   Preventative health care - Primary    USPSTF grade A and B recommendations reviewed with patient; age-appropriate recommendations, preventive care, screening tests, etc discussed and encouraged; healthy living encouraged; see AVS for patient education given to patient       Relevant Orders   CBC with Differential/Platelet   COMPLETE METABOLIC PANEL WITH GFR   Lipid panel   TSH       No orders of the defined types were placed in this encounter.  Orders Placed This Encounter  Procedures  . DG Bone Density    Order Specific Question:   Reason for Exam (SYMPTOM  OR DIAGNOSIS REQUIRED)    Answer:   osteoporosis    Order Specific Question:   Preferred imaging location?    Answer:   [facility]    Order Specific Question:   Is the patient pregnant?    Answer:   No  . CBC with Differential/Platelet  . COMPLETE METABOLIC PANEL WITH GFR  . Lipid panel  . TSH    Follow up plan: Return in about 1 year (around 10/07/2018) for complete physical.  An After Visit Summary was printed and given to the patient.

## 2017-10-08 LAB — LIPID PANEL
CHOL/HDL RATIO: 5.5 (calc) — AB (ref <5.0)
Cholesterol: 215 mg/dL — ABNORMAL HIGH (ref <200)
HDL: 39 mg/dL — AB (ref >50)
LDL Cholesterol (Calc): 150 mg/dL (calc) — ABNORMAL HIGH
NON-HDL CHOLESTEROL (CALC): 176 mg/dL — AB (ref <130)
Triglycerides: 134 mg/dL (ref <150)

## 2017-10-08 LAB — CBC WITH DIFFERENTIAL/PLATELET
Basophils Absolute: 62 cells/uL (ref 0–200)
Basophils Relative: 1.3 %
EOS ABS: 48 {cells}/uL (ref 15–500)
Eosinophils Relative: 1 %
HCT: 45.8 % — ABNORMAL HIGH (ref 35.0–45.0)
Hemoglobin: 15.7 g/dL — ABNORMAL HIGH (ref 11.7–15.5)
Lymphs Abs: 1334 cells/uL (ref 850–3900)
MCH: 31 pg (ref 27.0–33.0)
MCHC: 34.3 g/dL (ref 32.0–36.0)
MCV: 90.5 fL (ref 80.0–100.0)
MPV: 9.8 fL (ref 7.5–12.5)
Monocytes Relative: 12.1 %
Neutro Abs: 2774 cells/uL (ref 1500–7800)
Neutrophils Relative %: 57.8 %
PLATELETS: 330 10*3/uL (ref 140–400)
RBC: 5.06 10*6/uL (ref 3.80–5.10)
RDW: 11.9 % (ref 11.0–15.0)
TOTAL LYMPHOCYTE: 27.8 %
WBC: 4.8 10*3/uL (ref 3.8–10.8)
WBCMIX: 581 {cells}/uL (ref 200–950)

## 2017-10-08 LAB — COMPLETE METABOLIC PANEL WITH GFR
AG Ratio: 1.4 (calc) (ref 1.0–2.5)
ALBUMIN MSPROF: 3.9 g/dL (ref 3.6–5.1)
ALT: 11 U/L (ref 6–29)
AST: 13 U/L (ref 10–35)
Alkaline phosphatase (APISO): 84 U/L (ref 33–115)
BUN: 10 mg/dL (ref 7–25)
CALCIUM: 9.3 mg/dL (ref 8.6–10.2)
CO2: 29 mmol/L (ref 20–32)
CREATININE: 0.68 mg/dL (ref 0.50–1.10)
Chloride: 105 mmol/L (ref 98–110)
GFR, EST NON AFRICAN AMERICAN: 104 mL/min/{1.73_m2} (ref >=60)
GFR, Est African American: 121 mL/min/{1.73_m2} (ref >=60)
GLOBULIN: 2.7 g/dL (ref 1.9–3.7)
GLUCOSE: 78 mg/dL (ref 65–139)
Potassium: 4.5 mmol/L (ref 3.5–5.3)
SODIUM: 141 mmol/L (ref 135–146)
Total Bilirubin: 0.3 mg/dL (ref 0.2–1.2)
Total Protein: 6.6 g/dL (ref 6.1–8.1)

## 2017-10-08 LAB — TSH: TSH: 2.54 mIU/L

## 2018-10-06 ENCOUNTER — Other Ambulatory Visit: Payer: Self-pay | Admitting: Family Medicine

## 2018-10-06 DIAGNOSIS — Z1231 Encounter for screening mammogram for malignant neoplasm of breast: Secondary | ICD-10-CM

## 2018-10-08 ENCOUNTER — Ambulatory Visit
Admission: RE | Admit: 2018-10-08 | Discharge: 2018-10-08 | Disposition: A | Discharge status: Home / Self Care | Payer: 59 | Source: Ambulatory Visit | Attending: Family Medicine | Admitting: Family Medicine

## 2018-10-08 DIAGNOSIS — Z1231 Encounter for screening mammogram for malignant neoplasm of breast: Secondary | ICD-10-CM

## 2018-10-08 IMAGING — MG DIGITAL SCREENING BILATERAL MAMMOGRAM WITH TOMO AND CAD
8 series · 8 of 24 positions shown · non-contrast
Comparison: Previous exam(s).

CLINICAL DATA: Screening.

EXAM:
DIGITAL SCREENING BILATERAL MAMMOGRAM WITH TOMO AND CAD

[R CC synth-2D]
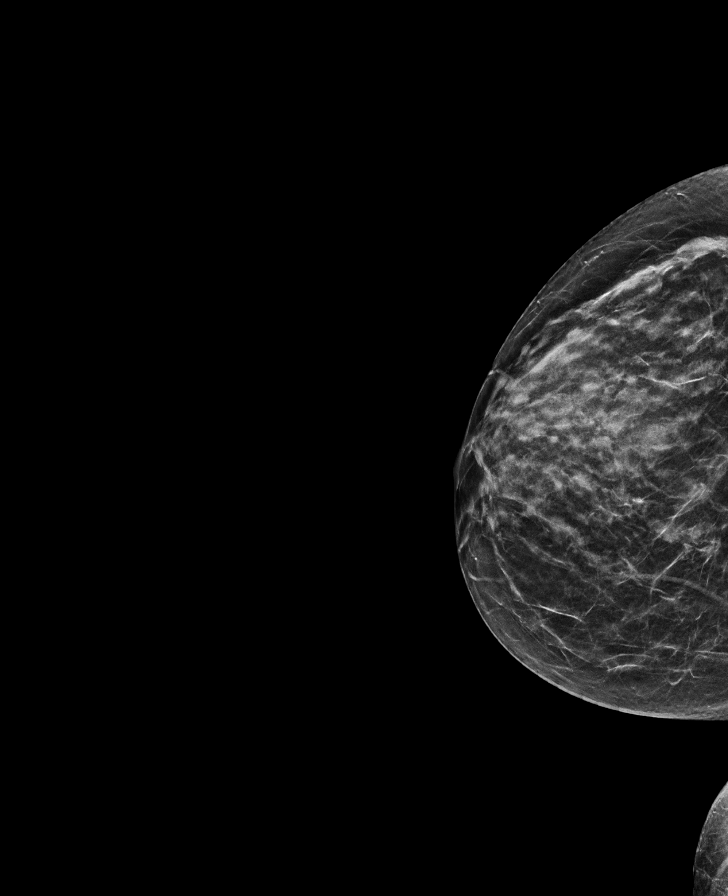

[L MLO synth-2D]
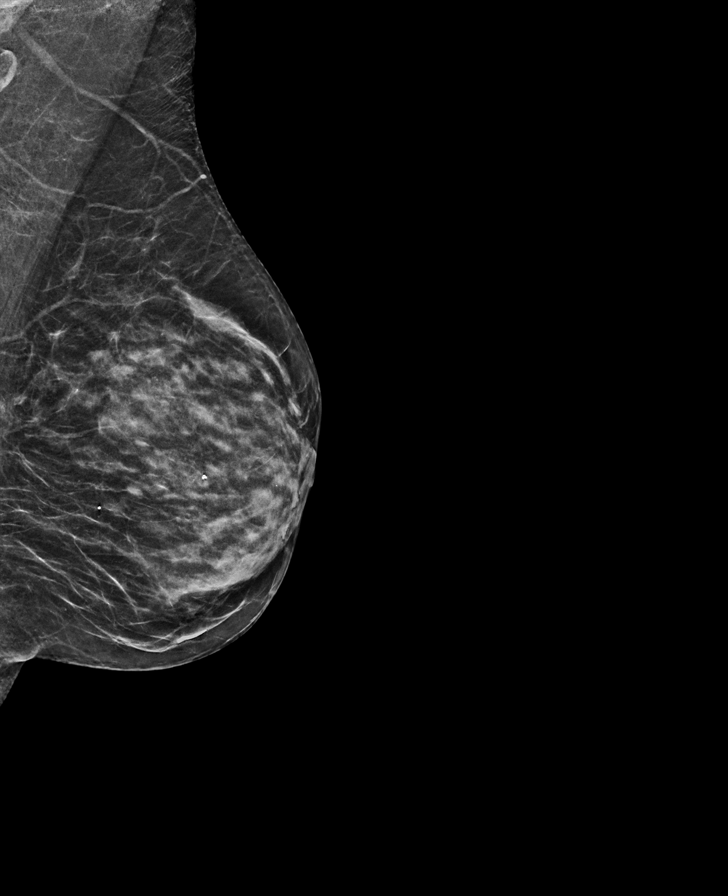

[L CC synth-2D]
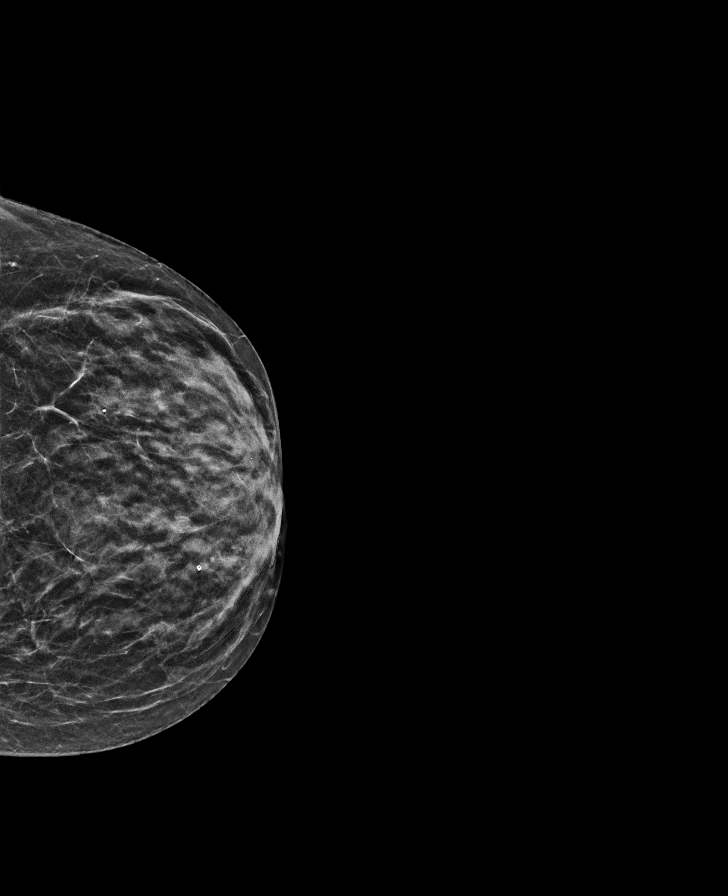

[R MLO synth-2D]
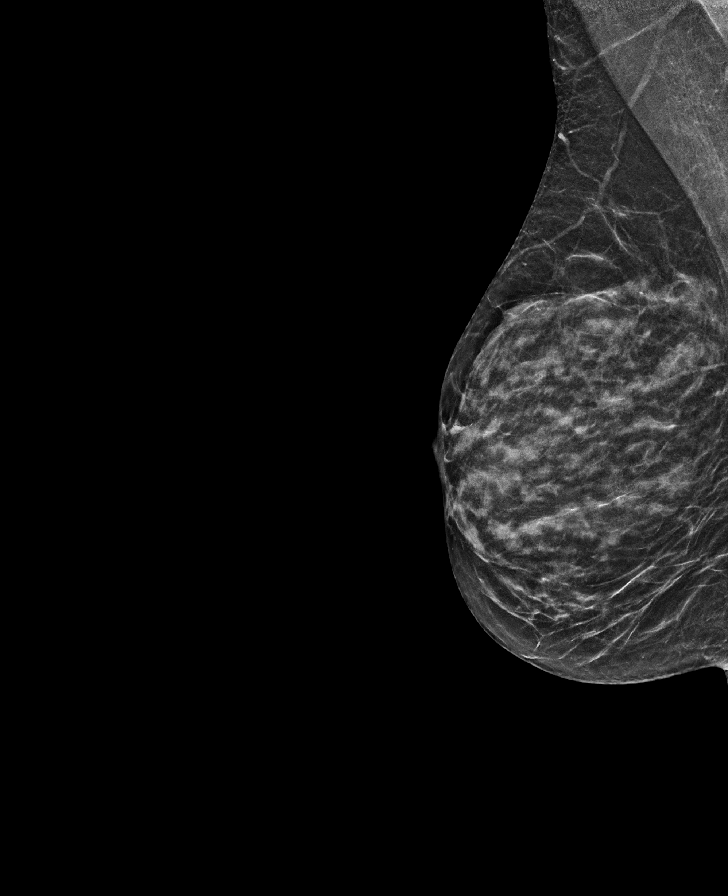

[L CC tomo · tomo slice 22/43.0]
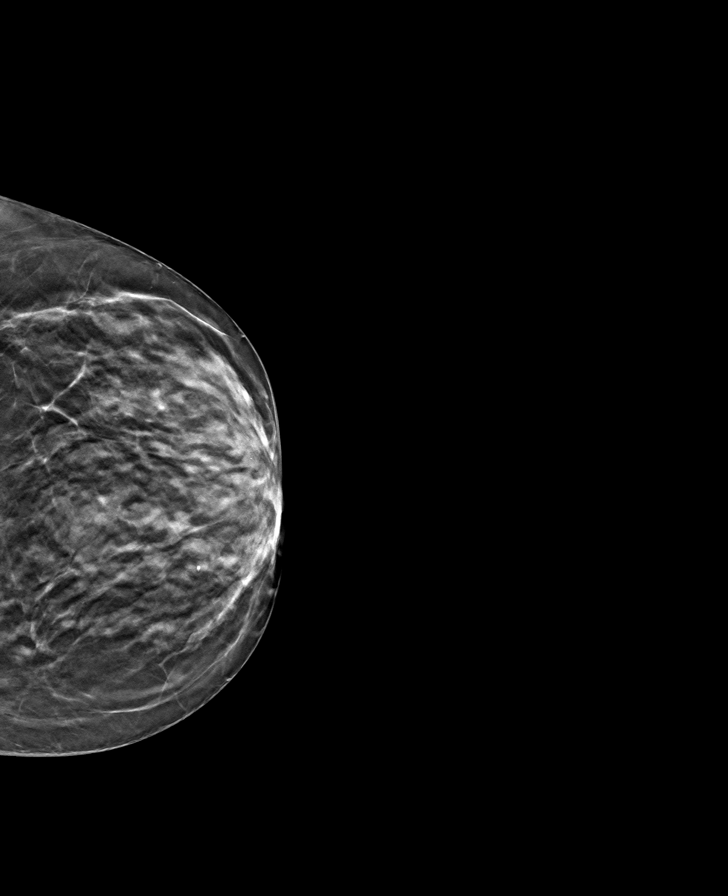

[R CC tomo · tomo slice 23/44.0]
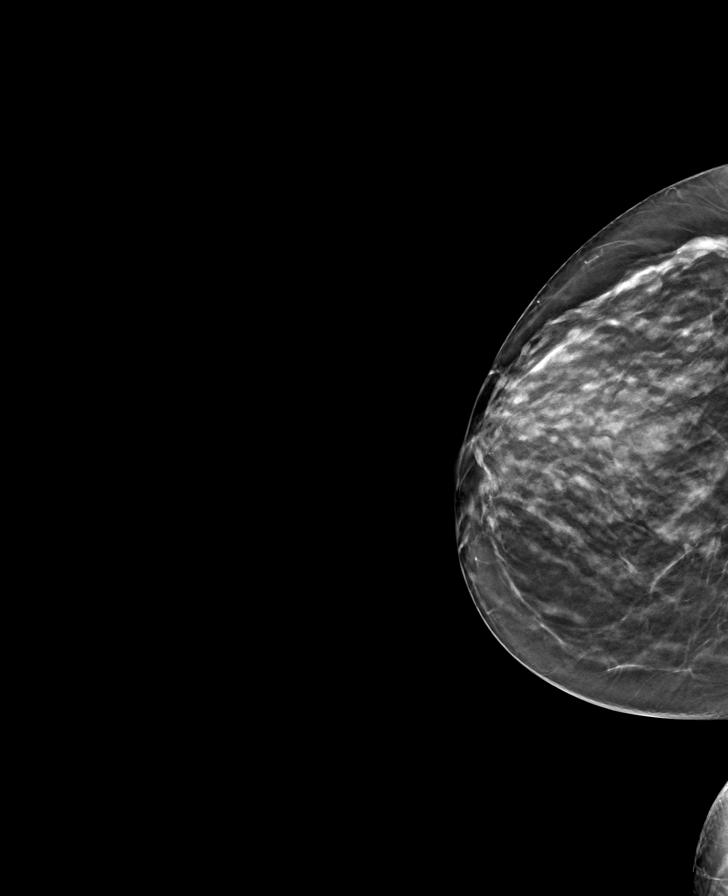

[L MLO tomo · tomo slice 23/46.0]
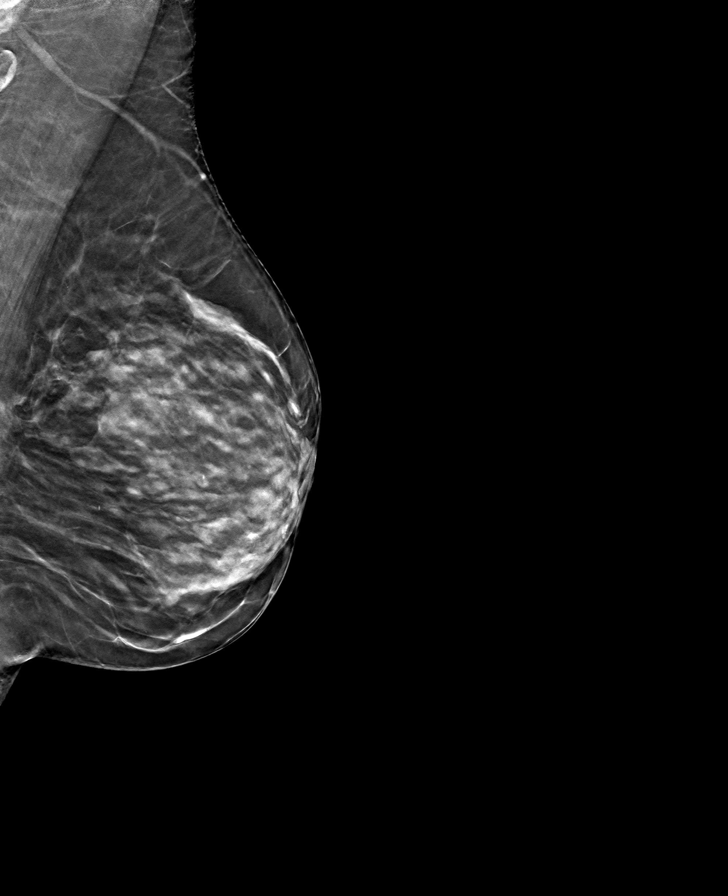

[R MLO tomo · tomo slice 23/46.0]
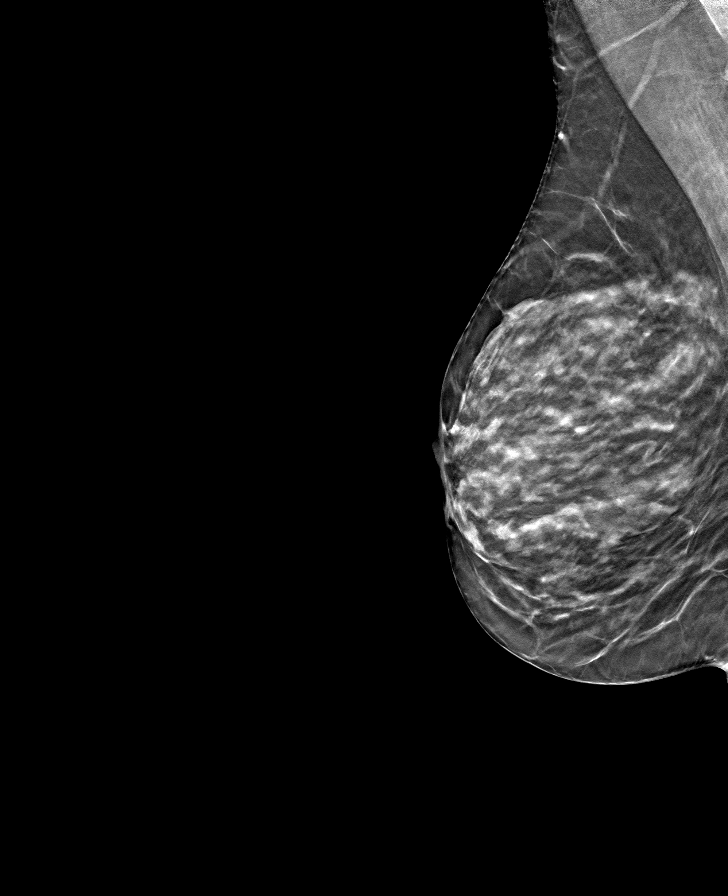

[8 of 24 positions shown; findings below may reference images not displayed]

ACR Breast Density Category c: The breast tissue is heterogeneously
dense, which may obscure small masses.
FINDINGS: There are no findings suspicious for malignancy. Images were
processed with CAD.
IMPRESSION: No mammographic evidence of malignancy. A result letter of this
screening mammogram will be mailed directly to the patient.

RECOMMENDATION:
Screening mammogram in one year. (Code:[5V])

BI-RADS CATEGORY  1: Negative.

## 2018-10-13 ENCOUNTER — Ambulatory Visit (INDEPENDENT_AMBULATORY_CARE_PROVIDER_SITE_OTHER): Payer: 59 | Admitting: Family Medicine

## 2018-10-13 ENCOUNTER — Encounter: Payer: Self-pay | Admitting: Family Medicine

## 2018-10-13 VITALS — BP 110/66 | HR 66 | Temp 97.8°F | Resp 14 | Ht 64.5 in | Wt 124.2 lb

## 2018-10-13 DIAGNOSIS — M545 Low back pain, unspecified: Secondary | ICD-10-CM

## 2018-10-13 DIAGNOSIS — M818 Other osteoporosis without current pathological fracture: Secondary | ICD-10-CM

## 2018-10-13 DIAGNOSIS — G8929 Other chronic pain: Secondary | ICD-10-CM

## 2018-10-13 DIAGNOSIS — Z720 Tobacco use: Secondary | ICD-10-CM | POA: Diagnosis not present

## 2018-10-13 DIAGNOSIS — M419 Scoliosis, unspecified: Secondary | ICD-10-CM

## 2018-10-13 DIAGNOSIS — R1084 Generalized abdominal pain: Secondary | ICD-10-CM | POA: Diagnosis not present

## 2018-10-13 DIAGNOSIS — Z0000 Encounter for general adult medical examination without abnormal findings: Secondary | ICD-10-CM | POA: Diagnosis not present

## 2018-10-13 NOTE — Patient Instructions (Addendum)
Health Maintenance, Female Adopting a healthy lifestyle and getting preventive care can go a long way to promote health and wellness. Talk with your health care provider about what schedule of regular examinations is right for you. This is a good chance for you to check in with your provider about disease prevention and staying healthy. In between checkups, there are plenty of things you can do on your own. Experts have done a lot of research about which lifestyle changes and preventive measures are most likely to keep you healthy. Ask your health care provider for more information. Weight and diet Eat a healthy diet  Be sure to include plenty of vegetables, fruits, low-fat dairy products, and lean protein.  Do not eat a lot of foods high in solid fats, added sugars, or salt.  Get regular exercise. This is one of the most important things you can do for your health. ? Most adults should exercise for at least 150 minutes each week. The exercise should increase your heart rate and make you sweat (moderate-intensity exercise). ? Most adults should also do strengthening exercises at least twice a week. This is in addition to the moderate-intensity exercise. Maintain a healthy weight  Body mass index (BMI) is a measurement that can be used to identify possible weight problems. It estimates body fat based on height and weight. Your health care provider can help determine your BMI and help you achieve or maintain a healthy weight.  For females 5 years of age and older: ? A BMI below 18.5 is considered underweight. ? A BMI of 18.5 to 24.9 is normal. ? A BMI of 25 to 29.9 is considered overweight. ? A BMI of 30 and above is considered obese. Watch levels of cholesterol and blood lipids  You should start having your blood tested for lipids and cholesterol at 50 years of age, then have this test every 5 years.  You may need to have your cholesterol levels checked more often if: ? Your lipid or  cholesterol levels are high. ? You are older than 49 years of age. ? You are at high risk for heart disease. Cancer screening Lung Cancer  Lung cancer screening is recommended for adults 48-79 years old who are at high risk for lung cancer because of a history of smoking.  A yearly low-dose CT scan of the lungs is recommended for people who: ? Currently smoke. ? Have quit within the past 15 years. ? Have at least a 30-pack-year history of smoking. A pack year is smoking an average of one pack of cigarettes a day for 1 year.  Yearly screening should continue until it has been 15 years since you quit.  Yearly screening should stop if you develop a health problem that would prevent you from having lung cancer treatment. Breast Cancer  Practice breast self-awareness. This means understanding how your breasts normally appear and feel.  It also means doing regular breast self-exams. Let your health care provider know about any changes, no matter how small.  If you are in your 20s or 30s, you should have a clinical breast exam (CBE) by a health care provider every 1-3 years as part of a regular health exam.  If you are 22 or older, have a CBE every year. Also consider having a breast X-ray (mammogram) every year.  If you have a family history of breast cancer, talk to your health care provider about genetic screening.  If you are at high risk for breast cancer, talk  to your health care provider about having an MRI and a mammogram every year.  Breast cancer gene (BRCA) assessment is recommended for women who have family members with BRCA-related cancers. BRCA-related cancers include: ? Breast. ? Ovarian. ? Tubal. ? Peritoneal cancers.  Results of the assessment will determine the need for genetic counseling and BRCA1 and BRCA2 testing. Cervical Cancer Your health care provider may recommend that you be screened regularly for cancer of the pelvic organs (ovaries, uterus, and vagina).  This screening involves a pelvic examination, including checking for microscopic changes to the surface of your cervix (Pap test). You may be encouraged to have this screening done every 3 years, beginning at age 21.  For women ages 30-65, health care providers may recommend pelvic exams and Pap testing every 3 years, or they may recommend the Pap and pelvic exam, combined with testing for human papilloma virus (HPV), every 5 years. Some types of HPV increase your risk of cervical cancer. Testing for HPV may also be done on women of any age with unclear Pap test results.  Other health care providers may not recommend any screening for nonpregnant women who are considered low risk for pelvic cancer and who do not have symptoms. Ask your health care provider if a screening pelvic exam is right for you.  If you have had past treatment for cervical cancer or a condition that could lead to cancer, you need Pap tests and screening for cancer for at least 20 years after your treatment. If Pap tests have been discontinued, your risk factors (such as having a new sexual partner) need to be reassessed to determine if screening should resume. Some women have medical problems that increase the chance of getting cervical cancer. In these cases, your health care provider may recommend more frequent screening and Pap tests. Colorectal Cancer  This type of cancer can be detected and often prevented.  Routine colorectal cancer screening usually begins at 50 years of age and continues through 49 years of age.  Your health care provider may recommend screening at an earlier age if you have risk factors for colon cancer.  Your health care provider may also recommend using home test kits to check for hidden blood in the stool.  A small camera at the end of a tube can be used to examine your colon directly (sigmoidoscopy or colonoscopy). This is done to check for the earliest forms of colorectal cancer.  Routine  screening usually begins at age 50.  Direct examination of the colon should be repeated every 5-10 years through 49 years of age. However, you may need to be screened more often if early forms of precancerous polyps or small growths are found. Skin Cancer  Check your skin from head to toe regularly.  Tell your health care provider about any new moles or changes in moles, especially if there is a change in a mole's shape or color.  Also tell your health care provider if you have a mole that is larger than the size of a pencil eraser.  Always use sunscreen. Apply sunscreen liberally and repeatedly throughout the day.  Protect yourself by wearing long sleeves, pants, a wide-brimmed hat, and sunglasses whenever you are outside. Heart disease, diabetes, and high blood pressure  High blood pressure causes heart disease and increases the risk of stroke. High blood pressure is more likely to develop in: ? People who have blood pressure in the high end of the normal range (130-139/85-89 mm Hg). ? People   who are overweight or obese. ? People who are African American.  If you are 84-22 years of age, have your blood pressure checked every 3-5 years. If you are 67 years of age or older, have your blood pressure checked every year. You should have your blood pressure measured twice-once when you are at a hospital or clinic, and once when you are not at a hospital or clinic. Record the average of the two measurements. To check your blood pressure when you are not at a hospital or clinic, you can use: ? An automated blood pressure machine at a pharmacy. ? A home blood pressure monitor.  If you are between 52 years and 3 years old, ask your health care provider if you should take aspirin to prevent strokes.  Have regular diabetes screenings. This involves taking a blood sample to check your fasting blood sugar level. ? If you are at a normal weight and have a low risk for diabetes, have this test once  every three years after 49 years of age. ? If you are overweight and have a high risk for diabetes, consider being tested at a younger age or more often. Preventing infection Hepatitis B  If you have a higher risk for hepatitis B, you should be screened for this virus. You are considered at high risk for hepatitis B if: ? You were born in a country where hepatitis B is common. Ask your health care provider which countries are considered high risk. ? Your parents were born in a high-risk country, and you have not been immunized against hepatitis B (hepatitis B vaccine). ? You have HIV or AIDS. ? You use needles to inject street drugs. ? You live with someone who has hepatitis B. ? You have had sex with someone who has hepatitis B. ? You get hemodialysis treatment. ? You take certain medicines for conditions, including cancer, organ transplantation, and autoimmune conditions. Hepatitis C  Blood testing is recommended for: ? Everyone born from [birth year] through 1966. ? Anyone with known risk factors for hepatitis C. Sexually transmitted infections (STIs)  You should be screened for sexually transmitted infections (STIs) including gonorrhea and chlamydia if: ? You are sexually active and are younger than 49 years of age. ? You are older than 49 years of age and your health care provider tells you that you are at risk for this type of infection. ? Your sexual activity has changed since you were last screened and you are at an increased risk for chlamydia or gonorrhea. Ask your health care provider if you are at risk.  If you do not have HIV, but are at risk, it may be recommended that you take a prescription medicine daily to prevent HIV infection. This is called pre-exposure prophylaxis (PrEP). You are considered at risk if: ? You are sexually active and do not regularly use condoms or know the HIV status of your partner(s). ? You take drugs by injection. ? You are sexually active with a partner  who has HIV. Talk with your health care provider about whether you are at high risk of being infected with HIV. If you choose to begin PrEP, you should first be tested for HIV. You should then be tested every 3 months for as long as you are taking PrEP. Pregnancy  If you are premenopausal and you may become pregnant, ask your health care provider about preconception counseling.  If you may become pregnant, take 400 to 800 micrograms (mcg) of folic acid every  day.  If you want to prevent pregnancy, talk to your health care provider about birth control (contraception). Osteoporosis and menopause  Osteoporosis is a disease in which the bones lose minerals and strength with aging. This can result in serious bone fractures. Your risk for osteoporosis can be identified using a bone density scan.  If you are 55 years of age or older, or if you are at risk for osteoporosis and fractures, ask your health care provider if you should be screened.  Ask your health care provider whether you should take a calcium or vitamin D supplement to lower your risk for osteoporosis.  Menopause may have certain physical symptoms and risks.  Hormone replacement therapy may reduce some of these symptoms and risks. Talk to your health care provider about whether hormone replacement therapy is right for you. Follow these instructions at home:  Schedule regular health, dental, and eye exams.  Stay current with your immunizations.  Do not use any tobacco products including cigarettes, chewing tobacco, or electronic cigarettes.  If you are pregnant, do not drink alcohol.  If you are breastfeeding, limit how much and how often you drink alcohol.  Limit alcohol intake to no more than 1 drink per day for nonpregnant women. One drink equals 12 ounces of beer, 5 ounces of wine, or 1 ounces of hard liquor.  Do not use street drugs.  Do not share needles.  Ask your health care provider for help if you need support  or information about quitting drugs.  Tell your health care provider if you often feel depressed.  Tell your health care provider if you have ever been abused or do not feel safe at home. This information is not intended to replace advice given to you by your health care provider. Make sure you discuss any questions you have with your health care provider. Document Released: 03/12/2011 Document Revised: 02/02/2016 Document Reviewed: 05/31/2015 Elsevier Interactive Patient Education  [employer].   I do encourage you to quit smoking Call [phone] to sign up for smoking cessation classes You can call 1-800-QUIT-NOW to talk with a smoking cessation coach   Health Risks of Smoking Smoking cigarettes is very bad for your health. Tobacco smoke has over 200 known poisons in it. It contains the poisonous gases nitrogen oxide and carbon monoxide. There are over 60 chemicals in tobacco smoke that cause cancer. Smoking is difficult to quit because a chemical in tobacco, called nicotine, causes addiction or dependence. When you smoke and inhale, nicotine is absorbed rapidly into the bloodstream through your lungs. Both inhaled and non-inhaled nicotine may be addictive. What are the risks of cigarette smoke? Cigarette smokers have an increased risk of many serious medical problems, including:  Lung cancer.  Lung disease, such as pneumonia, bronchitis, and emphysema.  Chest pain (angina) and heart attack because the heart is not getting enough oxygen.  Heart disease and peripheral blood vessel disease.  High blood pressure (hypertension).  Stroke.  Oral cancer, including cancer of the lip, mouth, or voice box.  Bladder cancer.  Pancreatic cancer.  Cervical cancer.  Pregnancy complications, including premature birth.  Stillbirths and smaller newborn babies, birth defects, and genetic damage to sperm.  Early menopause.  Lower estrogen level for  women.  Infertility.  Facial wrinkles.  Blindness.  Increased risk of broken bones (fractures).  Senile dementia.  Stomach ulcers and internal bleeding.  Delayed wound healing and increased risk of complications during surgery.  Even smoking lightly shortens your life  expectancy by several years. Because of secondhand smoke exposure, children of smokers have an increased risk of the following:  Sudden infant death syndrome (SIDS).  Respiratory infections.  Lung cancer.  Heart disease.  Ear infections. What are the benefits of quitting? There are many health benefits of quitting smoking. Here are some of them:  Within days of quitting smoking, your risk of having a heart attack decreases, your blood flow improves, and your lung capacity improves. Blood pressure, pulse rate, and breathing patterns start returning to normal soon after quitting.  Within months, your lungs may clear up completely.  Quitting for 10 years reduces your risk of developing lung cancer and heart disease to almost that of a nonsmoker.  People who quit may see an improvement in their overall quality of life. How do I quit smoking?     Smoking is an addiction with both physical and psychological effects, and longtime habits can be hard to change. Your health care provider can recommend:  Programs and community resources, which may include group support, education, or talk therapy.  Prescription medicines to help reduce cravings.  Nicotine replacement products, such as patches, gum, and nasal sprays. Use these products only as directed. Do not replace cigarette smoking with electronic cigarettes, which are commonly called e-cigarettes. The safety of e-cigarettes is not known, and some may contain harmful chemicals.  A combination of two or more of these methods. Where to find more information  American Lung Association: www.lung.org  American Cancer Society: www.cancer.org Summary  Smoking  cigarettes is very bad for your health. Cigarette smokers have an increased risk of many serious medical problems, including several cancers, heart disease, and stroke.  Smoking is an addiction with both physical and psychological effects, and longtime habits can be hard to change.  By stopping right away, you can greatly reduce the risk of medical problems for you and your family.  To help you quit smoking, your health care provider can recommend programs, community resources, prescription medicines, and nicotine replacement products such as patches, gum, and nasal sprays. This information is not intended to replace advice given to you by your health care provider. Make sure you discuss any questions you have with your health care provider. Document Released: 10/04/2004 Document Revised: 11/28/2017 Document Reviewed: 08/31/2016 Elsevier Interactive Patient Education  2019 [employer].  Steps to Quit Smoking  Smoking tobacco can be bad for your health. It can also affect almost every organ in your body. Smoking puts you and people around you at risk for many serious long-lasting (chronic) diseases. Quitting smoking is hard, but it is one of the best things that you can do for your health. It is never too late to quit. What are the benefits of quitting smoking? When you quit smoking, you lower your risk for getting serious diseases and conditions. They can include:  Lung cancer or lung disease.  Heart disease.  Stroke.  Heart attack.  Not being able to have children (infertility).  Weak bones (osteoporosis) and broken bones (fractures). If you have coughing, wheezing, and shortness of breath, those symptoms may get better when you quit. You may also get sick less often. If you are pregnant, quitting smoking can help to lower your chances of having a baby of low birth weight. What can I do to help me quit smoking? Talk with your doctor about what can help you quit smoking. Some things  you can do (strategies) include:  Quitting smoking totally, instead of slowly cutting back  how much you smoke over a period of time.  Going to in-person counseling. You are more likely to quit if you go to many counseling sessions.  Using resources and support systems, such as: ? Database administrator with a Social worker. ? Phone quitlines. ? Careers information officer. ? Support groups or group counseling. ? Text messaging programs. ? Mobile phone apps or applications.  Taking medicines. Some of these medicines may have nicotine in them. If you are pregnant or breastfeeding, do not take any medicines to quit smoking unless your doctor says it is okay. Talk with your doctor about counseling or other things that can help you. Talk with your doctor about using more than one strategy at the same time, such as taking medicines while you are also going to in-person counseling. This can help make quitting easier. What things can I do to make it easier to quit? Quitting smoking might feel very hard at first, but there is a lot that you can do to make it easier. Take these steps:  Talk to your family and friends. Ask them to support and encourage you.  Call phone quitlines, reach out to support groups, or work with a Social worker.  Ask people who smoke to not smoke around you.  Avoid places that make you want (trigger) to smoke, such as: ? Bars. ? Parties. ? Smoke-break areas at work.  Spend time with people who do not smoke.  Lower the stress in your life. Stress can make you want to smoke. Try these things to help your stress: ? Getting regular exercise. ? Deep-breathing exercises. ? Yoga. ? Meditating. ? Doing a body scan. To do this, close your eyes, focus on one area of your body at a time from head to toe, and notice which parts of your body are tense. Try to relax the muscles in those areas.  Download or buy apps on your mobile phone or tablet that can help you stick to your quit plan. There  are many free apps, such as QuitGuide from the [employer] Office manager for Disease Control and Prevention). You can find more support from smokefree.gov and other websites. This information is not intended to replace advice given to you by your health care provider. Make sure you discuss any questions you have with your health care provider. Document Released: 06/23/2009 Document Revised: 04/24/2016 Document Reviewed: 01/11/2015 Elsevier Interactive Patient Education  2019 [employer].  Please do monthly self-breast exams and notify me of any new findings   Please do call to schedule your bone density study; the number to schedule one at either [facility] or [facility] is [phone] or [phone]  We'll get labs today If you have not heard anything from my staff in a week about any orders/referrals/studies from today, please contact us here to follow-up [phone]

## 2018-10-13 NOTE — Assessment & Plan Note (Signed)
Re-order DEXA; fall precautions; 1200 mg of calcium a day, divided into 2-3 servings, vit D 1000 iu daily

## 2018-10-13 NOTE — Assessment & Plan Note (Signed)
USPSTF grade A and B recommendations reviewed with patient; age-appropriate recommendations, preventive care, screening tests, etc discussed and encouraged; healthy living encouraged; see AVS for patient education given to patient  

## 2018-10-13 NOTE — Assessment & Plan Note (Signed)
Encouraged cessation; see AVS 

## 2018-10-13 NOTE — Progress Notes (Signed)
BP 110/66   Pulse 66   Temp 97.8 F (36.6 C) (Oral)   Resp 14   Ht 5' 4.5" (1.638 m)   Wt 124 lb 3.2 oz (56.3 kg)   SpO2 96%   BMI 20.99 kg/m    Subjective:    Patient ID: Kathryn Fuller, female    DOB: 11/05/1970, 48 y.o.   MRN: [id number]  HPI: Kathryn Fuller is a 48 y.o. female  Chief Complaint  Patient presents with  . Annual Exam  . Abdominal Pain    RLQ ongoing with burning has had hx of hernia and feels pulling when she lifts  . Back Pain    Left upper side onset 2 months, pt aware of charge    HPI Patient is here for a physical  She has RLQ pain, burning; has hx of hernia; had CT of abd pand pelvis; had [place]; had CT hematuria work-up; saw surgeon, saw urologist; hurts when trying to pick up granddaughter; sitting in the car, going to sit will cause it to come on; squatting hurts; it can get intense; saw gastroenterologist as well; saw two different surgeons; reviewed note from most recent surgeon vist and GI visit  She also has pain in the left lateral chest wall; so uncomfortable at night; bothers her at night, when trying to sleep; thinks might have to do with her back; sharp and dull and aching; difficulty getting comfortable; feels like it is pulling; just the last 2 months or so; no injury; no rash like shingles  ----------------------------------------------- USPSTF grade A and B recommendations Depression:  Depression screen [facility] 2/9 10/13/2018 10/07/2017 12/27/2016 12/17/2016 11/29/2016  Decreased Interest 0 0 0 0 0  Down, Depressed, Hopeless 0 0 0 0 0  PHQ - 2 Score 0 0 0 0 0  Altered sleeping 0 - - - -  Tired, decreased energy 0 - - - -  Change in appetite 0 - - - -  Feeling bad or failure about yourself  0 - - - -  Trouble concentrating 0 - - - -  Moving slowly or fidgety/restless 0 - - - -  Suicidal thoughts 0 - - - -  PHQ-9 Score 0 - - - -  Difficult doing work/chores Not difficult at all - - - -   Hypertension: BP Readings from Last 3  Encounters:  10/13/18 110/66  10/07/17 106/68  09/23/17 104/65   Obesity: Wt Readings from Last 3 Encounters:  10/13/18 124 lb 3.2 oz (56.3 kg)  10/07/17 121 lb 14.4 oz (55.3 kg)  09/23/17 124 lb (56.2 kg)   BMI Readings from Last 3 Encounters:  10/13/18 20.99 kg/m  10/07/17 20.27 kg/m  09/23/17 21.28 kg/m     Skin cancer: nothing worrisome Lung cancer:  Smoker, 1 ppd; first cig of the day is 15 minutes after waking; last cig of the day right before 30-60 minutes before bedtime; not smoking in the middle of the night; quit years ago, cold [place] one time, once was with a pill; quit because she wanted to unzip her skin  Breast cancer: no new lumps or bumps; dense breast tissue; no fam hx; not doing monthly SBE; mammo UTD Colorectal cancer: no fam hx Cervical cancer screening: no cervix; noncancerous reasons for hyst BRCA gene screening: family hx of breast and/or ovarian cancer and/or metastatic prostate cancer? no HIV, hep B, hep C: not interested STD testing and prevention (chl/gon/syphilis): not interested Intimate partner violence: no abuse Contraception: n/a Osteoporosis:  Fall prevention/vitamin D: discussed Immunizations: patient declines flu; Td UTD Diet: not getting enough fruits and veggies; does eat red meat Exercise: no regular exercise, active with grandchild Alcohol:    Office Visit from 10/13/2018 in [facility]  AUDIT-C Score  0      Tobacco use: use, hoping she'll quit AAA: n/a Aspirin: n/a Glucose: check today Glucose  Date Value Ref Range Status  06/06/2012 79 65 - 99 mg/dL Final   Glucose, Bld  Date Value Ref Range Status  10/07/2017 78 65 - 139 mg/dL Final    Comment:    .        Non-fasting reference interval .   09/20/2017 82 65 - 99 mg/dL Final    Comment:    .            Fasting reference interval .   10/05/2016 87 65 - 99 mg/dL Final   Lipids:  Lab Results  Component Value Date   CHOL 215 (H) 10/07/2017    CHOL 242 (H) 10/17/2016   CHOL 255 (H) 10/05/2016   Lab Results  Component Value Date   HDL 39 (L) 10/07/2017   HDL 34 (L) 10/17/2016   HDL 37 (L) 10/05/2016   Lab Results  Component Value Date   LDLCALC 150 (H) 10/07/2017   LDLCALC 149 (H) 10/17/2016   LDLCALC NOT CALC 10/05/2016   Lab Results  Component Value Date   TRIG 134 10/07/2017   TRIG 294 (H) 10/17/2016   TRIG 511 (H) 10/05/2016   Lab Results  Component Value Date   CHOLHDL 5.5 (H) 10/07/2017   CHOLHDL 7.1 (H) 10/17/2016   CHOLHDL 6.9 (H) 10/05/2016   No results found for: LDLDIRECT   Depression screen [facility] 2/9 10/13/2018 10/07/2017 12/27/2016 12/17/2016 11/29/2016  Decreased Interest 0 0 0 0 0  Down, Depressed, Hopeless 0 0 0 0 0  PHQ - 2 Score 0 0 0 0 0  Altered sleeping 0 - - - -  Tired, decreased energy 0 - - - -  Change in appetite 0 - - - -  Feeling bad or failure about yourself  0 - - - -  Trouble concentrating 0 - - - -  Moving slowly or fidgety/restless 0 - - - -  Suicidal thoughts 0 - - - -  PHQ-9 Score 0 - - - -  Difficult doing work/chores Not difficult at all - - - -   Fall Risk  10/13/2018 10/07/2017 12/27/2016 12/17/2016 11/29/2016  Falls in the past year? 0 No No No No  Number falls in past yr: 0 - - - -  Injury with Fall? 0 - - - -    Relevant past medical, surgical, family and social history reviewed Past Medical History:  Diagnosis Date  . Abdominal aortic atherosclerosis ([place]) 12/17/2016   Noted on CT scan March 2018  . Diverticulitis   . Diverticulosis of colon 10/16   Hospitalized   . Dyslipidemia 06/05/2015  . Endometriosis   . Osteoporosis   . Pneumonia   . Scoliosis 1981   Treated at the [facility] in [place], [place]. Reported 30 rotation.  . Tobacco abuse 03/27/2015   Past Surgical History:  Procedure Laterality Date  . ABDOMINAL HYSTERECTOMY  2004   TAH-BSO  . COLONOSCOPY WITH PROPOFOL N/A 01/31/2017   Procedure: COLONOSCOPY WITH PROPOFOL;  Surgeon: Lucilla Lame, MD;   Location: [place];  Service: Endoscopy;  Laterality: N/A;  . [place]  2006  .  INGUINAL HERNIA REPAIR Right 2008   Dr. Jamal Collin   Family History  Problem Relation Age of Onset  . Diabetes Mother   . Dementia Mother   . Heart disease Father   . Dementia Father   . Heart disease Maternal Uncle   . Heart attack Maternal Uncle   . Heart disease Maternal Grandmother   . Heart attack Maternal Grandmother   . Lupus Other        niece  . Breast cancer Neg Hx   . Cancer Neg Hx   . COPD Neg Hx   . Stroke Neg Hx   . Bladder Cancer Neg Hx   . Kidney cancer Neg Hx   . Prostate cancer Neg Hx    Social History   Tobacco Use  . Smoking status: Current Every Day Smoker    Packs/day: 1.00    Years: 27.00    Pack years: 27.00    Types: Cigarettes  . Smokeless tobacco: Never Used  Substance Use Topics  . Alcohol use: No  . Drug use: No     Office Visit from 10/13/2018 in [facility]  AUDIT-C Score  0      Interim medical history since last visit reviewed. Allergies and medications reviewed  Review of Systems  Constitutional: Negative for fever and unexpected weight change.  HENT: Negative for hearing loss.   Eyes: Negative for visual disturbance.  Respiratory: Negative for wheezing.   Cardiovascular: Negative for chest pain.  Gastrointestinal: Negative for blood in stool.  Endocrine: Negative for polydipsia.  Genitourinary: Negative for hematuria.  Musculoskeletal: Positive for back pain ("always", hurt all the time, all over).  Skin:       No worrisome moles  Allergic/Immunologic: Negative for food allergies.  Neurological: Positive for tremors (nothing new).  Hematological: Negative for adenopathy. Bruises/bleeds easily (nothing new).  Psychiatric/Behavioral: Negative for self-injury.   Per HPI unless specifically indicated above     Objective:    BP 110/66   Pulse 66   Temp 97.8 F (36.6 C) (Oral)   Resp 14   Ht 5'  4.5" (1.638 m)   Wt 124 lb 3.2 oz (56.3 kg)   SpO2 96%   BMI 20.99 kg/m   Wt Readings from Last 3 Encounters:  10/13/18 124 lb 3.2 oz (56.3 kg)  10/07/17 121 lb 14.4 oz (55.3 kg)  09/23/17 124 lb (56.2 kg)    Physical Exam Constitutional:      Appearance: Normal appearance. She is well-developed.  HENT:     Head: Normocephalic and atraumatic.     Right Ear: Hearing, tympanic membrane, ear canal and external ear normal.     Left Ear: Hearing, tympanic membrane, ear canal and external ear normal.  Eyes:     General: No scleral icterus.       Right eye: No hordeolum.        Left eye: No hordeolum.     Conjunctiva/sclera: Conjunctivae normal.  Neck:     Thyroid: No thyromegaly.     Vascular: No carotid bruit.  Cardiovascular:     Rate and Rhythm: Normal rate and regular rhythm.  No extrasystoles are present.    Heart sounds: Normal heart sounds, S1 normal and S2 normal.  Pulmonary:     Effort: Pulmonary effort is normal. No respiratory distress.     Breath sounds: Normal breath sounds.  Chest:     Breasts: Breasts are symmetrical.        Right: No inverted  nipple, mass, nipple discharge, skin change or tenderness.        Left: No inverted nipple, mass, nipple discharge, skin change or tenderness.  Abdominal:     General: Bowel sounds are normal. There is no distension or abdominal bruit.     Palpations: Abdomen is soft. There is no mass or pulsatile mass.     Tenderness: There is abdominal tenderness in the right lower quadrant. There is no guarding.     Hernia: No hernia is present.    Musculoskeletal: Normal range of motion.     Thoracic back: She exhibits tenderness and deformity (scoliosis).       Back:  Lymphadenopathy:     Head:     Right side of head: No submandibular adenopathy.     Left side of head: No submandibular adenopathy.     Cervical: No cervical adenopathy.  Skin:    General: Skin is warm and dry.     Coloration: Skin is not jaundiced or pale.      Findings: No bruising or ecchymosis.     Comments: Wrinkled and aged, c/w years of smoking  Neurological:     Mental Status: She is alert.     Cranial Nerves: No cranial nerve deficit.     Motor: No tremor or abnormal muscle tone.     Gait: Gait normal.     Deep Tendon Reflexes:     Reflex Scores:      Patellar reflexes are 2+ on the right side and 2+ on the left side. Psychiatric:        Mood and Affect: Mood is not anxious or depressed.        Speech: Speech normal.        Behavior: Behavior normal.        Thought Content: Thought content normal.     Results for orders placed or performed in visit on 10/07/17  CBC with Differential/Platelet  Result Value Ref Range   WBC 4.8 3.8 - 10.8 Thousand/uL   RBC 5.06 3.80 - 5.10 Million/uL   Hemoglobin 15.7 (H) 11.7 - 15.5 g/dL   HCT 45.8 (H) 35.0 - 45.0 %   MCV 90.5 80.0 - 100.0 fL   MCH 31.0 27.0 - 33.0 pg   MCHC 34.3 32.0 - 36.0 g/dL   RDW 11.9 11.0 - 15.0 %   Platelets 330 140 - 400 Thousand/uL   MPV 9.8 7.5 - 12.5 fL   Neutro Abs 2,774 1,500 - 7,800 cells/uL   Lymphs Abs 1,334 850 - 3,900 cells/uL   WBC mixed population 581 200 - 950 cells/uL   Eosinophils Absolute 48 15 - 500 cells/uL   Basophils Absolute 62 0 - 200 cells/uL   Neutrophils Relative % 57.8 %   Total Lymphocyte 27.8 %   Monocytes Relative 12.1 %   Eosinophils Relative 1.0 %   Basophils Relative 1.3 %  COMPLETE METABOLIC PANEL WITH GFR  Result Value Ref Range   Glucose, Bld 78 65 - 139 mg/dL   BUN 10 7 - 25 mg/dL   Creat 0.68 0.50 - 1.10 mg/dL   GFR, Est Non African American 104 > OR = 60 mL/min/1.17m   GFR, Est African American 121 > OR = 60 mL/min/1.761m  BUN/Creatinine Ratio NOT APPLICABLE 6 - 22 (calc)   Sodium 141 135 - 146 mmol/L   Potassium 4.5 3.5 - 5.3 mmol/L   Chloride 105 98 - 110 mmol/L   CO2 29 20 - 32 mmol/L  Calcium 9.3 8.6 - 10.2 mg/dL   Total Protein 6.6 6.1 - 8.1 g/dL   Albumin 3.9 3.6 - 5.1 g/dL   Globulin 2.7 1.9 - 3.7 g/dL  (calc)   AG Ratio 1.4 1.0 - 2.5 (calc)   Total Bilirubin 0.3 0.2 - 1.2 mg/dL   Alkaline phosphatase (APISO) 84 33 - 115 U/L   AST 13 10 - 35 U/L   ALT 11 6 - 29 U/L  Lipid panel  Result Value Ref Range   Cholesterol 215 (H) <200 mg/dL   HDL 39 (L) >50 mg/dL   Triglycerides 134 <150 mg/dL   LDL Cholesterol (Calc) 150 (H) mg/dL (calc)   Total CHOL/HDL Ratio 5.5 (H) <5.0 (calc)   Non-HDL Cholesterol (Calc) 176 (H) <130 mg/dL (calc)  TSH  Result Value Ref Range   TSH 2.54 mIU/L      Assessment & Plan:   Problem List Items Addressed This Visit      Musculoskeletal and Integument   Osteoporosis    Re-order DEXA; fall precautions; 1200 mg of calcium a day, divided into 2-3 servings, vit D 1000 iu daily      Relevant Orders   DG Bone Density     Other   Tobacco abuse    Encouraged cessation; see AVS      Routine general medical examination at a health care facility - Primary   Relevant Orders   CBC   Comprehensive metabolic panel   Lipid panel   TSH   Preventative health care    USPSTF grade A and B recommendations reviewed with patient; age-appropriate recommendations, preventive care, screening tests, etc discussed and encouraged; healthy living encouraged; see AVS for patient education given to patient        Other Visit Diagnoses    Generalized abdominal pain       Relevant Orders   Ambulatory referral to General Surgery   Urinalysis w microscopic + reflex cultur   Scoliosis of thoracolumbar spine, unspecified scoliosis type       Relevant Orders   Ambulatory referral to Orthopedics   Chronic bilateral low back pain without sciatica       Relevant Medications   naproxen sodium (ALEVE) 220 MG tablet   Other Relevant Orders   Ambulatory referral to Orthopedics       Follow up plan: Return in 1 year (on 10/14/2019).  An after-visit summary was printed and given to the patient at [place].  Please see the patient instructions which may contain other  information and recommendations beyond what is mentioned above in the assessment and plan.  No orders of the defined types were placed in this encounter.   Orders Placed This Encounter  Procedures  . DG Bone Density  . CBC  . Comprehensive metabolic panel  . Lipid panel  . TSH  . Urinalysis w microscopic + reflex cultur  . Ambulatory referral to General Surgery  . Ambulatory referral to Orthopedics

## 2018-10-14 LAB — COMPREHENSIVE METABOLIC PANEL
AG Ratio: 1.8 (calc) (ref 1.0–2.5)
ALKALINE PHOSPHATASE (APISO): 71 U/L (ref 31–125)
ALT: 15 U/L (ref 6–29)
AST: 15 U/L (ref 10–35)
Albumin: 4.3 g/dL (ref 3.6–5.1)
BUN: 12 mg/dL (ref 7–25)
CHLORIDE: 105 mmol/L (ref 98–110)
CO2: 32 mmol/L (ref 20–32)
CREATININE: 0.7 mg/dL (ref 0.50–1.10)
Calcium: 9.6 mg/dL (ref 8.6–10.2)
Globulin: 2.4 g/dL (calc) (ref 1.9–3.7)
Glucose, Bld: 70 mg/dL (ref 65–99)
Potassium: 4.5 mmol/L (ref 3.5–5.3)
Sodium: 141 mmol/L (ref 135–146)
Total Bilirubin: 0.4 mg/dL (ref 0.2–1.2)
Total Protein: 6.7 g/dL (ref 6.1–8.1)

## 2018-10-14 LAB — LIPID PANEL
Cholesterol: 243 mg/dL — ABNORMAL HIGH (ref <200)
HDL: 45 mg/dL — ABNORMAL LOW (ref >=50)
LDL Cholesterol (Calc): 169 mg/dL (calc) — ABNORMAL HIGH
NON-HDL CHOLESTEROL (CALC): 198 mg/dL — AB (ref <130)
Total CHOL/HDL Ratio: 5.4 (calc) — ABNORMAL HIGH (ref <5.0)
Triglycerides: 144 mg/dL (ref <150)

## 2018-10-14 LAB — URINALYSIS W MICROSCOPIC + REFLEX CULTURE
Bacteria, UA: NONE SEEN /HPF
Bilirubin Urine: NEGATIVE
Glucose, UA: NEGATIVE
Hyaline Cast: NONE SEEN /LPF
Ketones, ur: NEGATIVE
Leukocyte Esterase: NEGATIVE
Nitrites, Initial: NEGATIVE
PH: 6 (ref 5.0–8.0)
Protein, ur: NEGATIVE
RBC / HPF: NONE SEEN /HPF (ref 0–2)
Specific Gravity, Urine: 1.007 (ref 1.001–1.03)
Squamous Epithelial / HPF: NONE SEEN /HPF (ref <=5)
WBC, UA: NONE SEEN /HPF (ref 0–5)

## 2018-10-14 LAB — TSH: TSH: 2.96 mIU/L

## 2018-10-14 LAB — CBC
HCT: 45.2 % — ABNORMAL HIGH (ref 35.0–45.0)
Hemoglobin: 15.4 g/dL (ref 11.7–15.5)
MCH: 31.4 pg (ref 27.0–33.0)
MCHC: 34.1 g/dL (ref 32.0–36.0)
MCV: 92.1 fL (ref 80.0–100.0)
MPV: 9.8 fL (ref 7.5–12.5)
Platelets: 306 10*3/uL (ref 140–400)
RBC: 4.91 10*6/uL (ref 3.80–5.10)
RDW: 12.4 % (ref 11.0–15.0)
WBC: 5.3 10*3/uL (ref 3.8–10.8)

## 2018-10-14 LAB — NO CULTURE INDICATED

## 2018-11-17 ENCOUNTER — Ambulatory Visit
Admission: RE | Admit: 2018-11-17 | Discharge: 2018-11-17 | Disposition: A | Discharge status: Home / Self Care | Payer: 59 | Source: Ambulatory Visit | Attending: Family Medicine | Admitting: Family Medicine

## 2018-11-17 DIAGNOSIS — Z780 Asymptomatic menopausal state: Secondary | ICD-10-CM | POA: Diagnosis not present

## 2018-11-17 DIAGNOSIS — M818 Other osteoporosis without current pathological fracture: Secondary | ICD-10-CM

## 2018-11-17 DIAGNOSIS — M810 Age-related osteoporosis without current pathological fracture: Secondary | ICD-10-CM | POA: Diagnosis not present
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
## 2019-01-14 ENCOUNTER — Encounter: Payer: Self-pay | Admitting: Nurse Practitioner

## 2019-01-14 ENCOUNTER — Other Ambulatory Visit: Payer: Self-pay

## 2019-01-14 ENCOUNTER — Ambulatory Visit (INDEPENDENT_AMBULATORY_CARE_PROVIDER_SITE_OTHER): Payer: 59 | Admitting: Nurse Practitioner

## 2019-01-14 VITALS — BP 104/66 | HR 88 | Temp 97.6°F | Resp 16 | Ht 64.5 in | Wt 124.7 lb

## 2019-01-14 DIAGNOSIS — E559 Vitamin D deficiency, unspecified: Secondary | ICD-10-CM

## 2019-01-14 DIAGNOSIS — K5790 Diverticulosis of intestine, part unspecified, without perforation or abscess without bleeding: Secondary | ICD-10-CM | POA: Diagnosis not present

## 2019-01-14 DIAGNOSIS — E785 Hyperlipidemia, unspecified: Secondary | ICD-10-CM | POA: Diagnosis not present

## 2019-01-14 DIAGNOSIS — Z720 Tobacco use: Secondary | ICD-10-CM | POA: Diagnosis not present

## 2019-01-14 DIAGNOSIS — M810 Age-related osteoporosis without current pathological fracture: Secondary | ICD-10-CM

## 2019-01-14 NOTE — Progress Notes (Signed)
Name: Kathryn Fuller   MRN: [id number]    DOB: 1971-05-11   Date:01/14/2019       Progress Note  Subjective  Chief Complaint  Chief Complaint  Patient presents with  . Paperwork    HPI  Patient present to get life insurance paperwork completed  Tobacco abuse 1 PPD smoking, not ready to quit but states will try to cut down after the pandemic. Triggered now due to anxiety and stress with pandemic, does not want to try medicine  Hyperlipidemia Does not want to try statin, discussed diet.  Lab Results  Component Value Date   CHOL 243 (H) 10/13/2018   HDL 45 (L) 10/13/2018   LDLCALC 169 (H) 10/13/2018   TRIG 144 10/13/2018   CHOLHDL 5.4 (H) 10/13/2018   Diverticulosis Had an episode of diverticulitis in 2018, normal bowel movements presetnly  Osteoporosis  Patient takes vitamin D and calcium supplements here and there. Does not want to try bisphosphonate or other medications. She is active and walks a lot at work.    PHQ2/9: Depression screen [facility] 2/9 01/14/2019 10/13/2018 10/07/2017 12/27/2016 12/17/2016  Decreased Interest 0 0 0 0 0  Down, Depressed, Hopeless 0 0 0 0 0  PHQ - 2 Score 0 0 0 0 0  Altered sleeping 0 0 - - -  Tired, decreased energy 0 0 - - -  Change in appetite 0 0 - - -  Feeling bad or failure about yourself  0 0 - - -  Trouble concentrating 0 0 - - -  Moving slowly or fidgety/restless 0 0 - - -  Suicidal thoughts 0 0 - - -  PHQ-9 Score 0 0 - - -  Difficult doing work/chores Not difficult at all Not difficult at all - - -     PHQ reviewed. Negative  Patient Active Problem List   Diagnosis Date Noted  . Routine general medical examination at a health care facility 10/13/2018  . Abnormal feces   . Polyp of sigmoid colon   . Abdominal aortic atherosclerosis ([place]) 12/17/2016  . Heme positive stool 10/17/2016  . Hypertriglyceridemia 10/11/2016  . Vitamin D deficiency 10/11/2016  . Elevated hematocrit 10/11/2016  . Right lower quadrant pain 10/05/2016   . Breast cancer screening 10/05/2016  . Preventative health care 10/05/2016  . Osteoporosis 08/09/2015  . Diverticulitis large intestine 06/09/2015  . Dyslipidemia 06/05/2015  . Tobacco abuse 03/27/2015    Past Medical History:  Diagnosis Date  . Abdominal aortic atherosclerosis ([place]) 12/17/2016   Noted on CT scan March 2018  . Diverticulitis   . Diverticulosis of colon 10/16   Hospitalized   . Dyslipidemia 06/05/2015  . Endometriosis   . Osteoporosis   . Pneumonia   . Scoliosis 1981   Treated at the [facility] in [place], [place]. Reported 30 rotation.  . Tobacco abuse 03/27/2015    Past Surgical History:  Procedure Laterality Date  . ABDOMINAL HYSTERECTOMY  2004   TAH-BSO  . COLONOSCOPY WITH PROPOFOL N/A 01/31/2017   Procedure: COLONOSCOPY WITH PROPOFOL;  Surgeon: Lucilla Lame, MD;  Location: [place];  Service: Endoscopy;  Laterality: N/A;  . [place]  2006  . INGUINAL HERNIA REPAIR Right 2008   Dr. Jamal Collin    Social History   Tobacco Use  . Smoking status: Current Every Day Smoker    Packs/day: 1.00    Years: 27.00    Pack years: 27.00    Types: Cigarettes  . Smokeless tobacco: Never Used  Substance Use Topics  . Alcohol use: No     Current Outpatient Medications:  .  acetaminophen (TYLENOL) 500 MG tablet, Take 500 mg by mouth every 6 (six) hours as needed., Disp: , Rfl:  .  naproxen sodium (ALEVE) 220 MG tablet, Take 220 mg by mouth as needed., Disp: , Rfl:   No Known Allergies  ROS    No other specific complaints in a complete review of systems (except as listed in HPI above).  Objective  Vitals:   01/14/19 1437  BP: 104/66  Pulse: 88  Resp: 16  Temp: 97.6 F (36.4 C)  TempSrc: Oral  SpO2: 96%  Weight: 124 lb 11.2 oz (56.6 kg)  Height: 5' 4.5" (1.638 m)     Body mass index is 21.07 kg/m.  Nursing Note and Vital Signs reviewed.  Physical Exam Constitutional:      Appearance: Normal appearance.  She is well-developed.  HENT:     Head: Normocephalic and atraumatic.     Right Ear: Hearing normal.     Left Ear: Hearing normal.  Eyes:     Conjunctiva/sclera: Conjunctivae normal.  Cardiovascular:     Rate and Rhythm: Normal rate.  Pulmonary:     Effort: Pulmonary effort is normal.  Musculoskeletal: Normal range of motion.  Neurological:     Mental Status: She is alert and oriented to person, place, and time.  Psychiatric:        Speech: Speech normal.        Behavior: Behavior normal. Behavior is cooperative.        Thought Content: Thought content normal.        Judgment: Judgment normal.       No results found for this or any previous visit (from the past 48 hour(s)).  Assessment & Plan  1. Dyslipidemia Declines medications. Bad cholesterol, also called low-density lipoprotein (LDL), carries cholesterol and other fats that your liver makes to your body tissue. If it builds up in blood vessels, LDL can cause heart disease and other health problems. Your LDL level should be below 100. If you have diabetes or a possible heart problem, your LDL should be below 70.  Eat: Eat 20 to 30 grams of soluble fiber every day. Foods such as fruits and vegetables, whole grains, beans, peas, nuts, and seeds can help lower LDL. Avoid: Saturated fats (Dairy foods - such as butter, cream, ghee, regular-fat milk and cheese. Meat - such as fatty cuts of beef, pork and lamb, processed meats like salami, sausages and the skin on chicken. Lard., fatty snack foods, cakes, biscuits, pies and deep fried foods) Avoid smoking  - Look into taking omega 3 fish oils its natural supplement to help with cholesterol - Look into zetia, a prescription strength cholesterol medicine that is not a Statin to help decrease your cholesterol   2. Vitamin D deficiency Discussed supplementation 600-1,000 IU daily   3. Diverticulosis stable  4. Tobacco use Recommend quitting, start by cutting down   5.  Osteoporosis without current pathological fracture, unspecified osteoporosis type General recommendations for prevention of osteoporosis: avoid smoking and heavy alcohol consumption, do weight-bearing exercises regularly.  An optimal diet for bone health involves making sure you get enough protein and calories as well as plenty of calcium and vitamin D, which are essential in helping to maintain proper bone formation and density. Postmenopausal women should consume 1200 mg of calcium per day and 800 IU of Vitamin D per day (total of diet plus supplements).  The main dietary sources of calcium include milk and other dairy products, such as cottage cheese, yogurt, and hard cheese, and green vegetables, such as kale and broccoli. Sunlight exposure is recommended if tolerated for 30 minutes 5 times a day.

## 2019-01-14 NOTE — Patient Instructions (Addendum)
Bad cholesterol, also called low-density lipoprotein (LDL), carries cholesterol and other fats that your liver makes to your body tissue. If it builds up in blood vessels, LDL can cause heart disease and other health problems. Your LDL level should be below 100. If you have diabetes or a possible heart problem, your LDL should be below 70.  Eat: Eat 20 to 30 grams of soluble fiber every day. Foods such as fruits and vegetables, whole grains, beans, peas, nuts, and seeds can help lower LDL. Avoid: Saturated fats (Dairy foods - such as butter, cream, ghee, regular-fat milk and cheese. Meat - such as fatty cuts of beef, pork and lamb, processed meats like salami, sausages and the skin on chicken. Lard., fatty snack foods, cakes, biscuits, pies and deep fried foods) Avoid smoking  - Look into taking omega 3 fish oils its natural supplement to help with cholesterol - Look into zetia, a prescription strength cholesterol medicine that is not a Statin to help decrease your cholesterol    General recommendations for prevention of osteoporosis: avoid smoking and heavy alcohol consumption, do weight-bearing exercises regularly.  An optimal diet for bone health involves making sure you get enough protein and calories as well as plenty of calcium and vitamin D, which are essential in helping to maintain proper bone formation and density. Postmenopausal women should consume 1200 mg of calcium per day and 800 IU of Vitamin D per day (total of diet plus supplements). The main dietary sources of calcium include milk and other dairy products, such as cottage cheese, yogurt, and hard cheese, and green vegetables, such as kale and broccoli. Sunlight exposure is recommended if tolerated for 30 minutes 5 times a day.   I strongly recommend you quit smoking If you would like start a free smoking cessation class, you can call [phone] Call 1-800-QUIT-NOW if you would like to speak with a quit coach

## 2019-01-27 DIAGNOSIS — R1031 Right lower quadrant pain: Secondary | ICD-10-CM | POA: Diagnosis not present

## 2019-01-27 DIAGNOSIS — Z13828 Encounter for screening for other musculoskeletal disorder: Secondary | ICD-10-CM | POA: Diagnosis not present

## 2019-01-27 DIAGNOSIS — Z720 Tobacco use: Secondary | ICD-10-CM | POA: Diagnosis not present

## 2019-01-27 DIAGNOSIS — K432 Incisional hernia without obstruction or gangrene: Secondary | ICD-10-CM | POA: Diagnosis not present

## 2019-10-16 ENCOUNTER — Encounter: Payer: Self-pay | Admitting: Family Medicine

## 2019-10-23 ENCOUNTER — Encounter: Payer: Self-pay | Admitting: Emergency Medicine

## 2019-10-23 ENCOUNTER — Emergency Department: Payer: 59

## 2019-10-23 ENCOUNTER — Other Ambulatory Visit: Payer: Self-pay

## 2019-10-23 ENCOUNTER — Emergency Department
Admission: EM | Admit: 2019-10-23 | Discharge: 2019-10-23 | Disposition: A | Discharge status: Home / Self Care | Payer: Worker's Compensation | Attending: Student | Admitting: Student

## 2019-10-23 ENCOUNTER — Emergency Department: Payer: 59 | Attending: Emergency Medicine

## 2019-10-23 DIAGNOSIS — S161XXA Strain of muscle, fascia and tendon at neck level, initial encounter: Secondary | ICD-10-CM | POA: Diagnosis not present

## 2019-10-23 DIAGNOSIS — S8001XA Contusion of right knee, initial encounter: Secondary | ICD-10-CM | POA: Diagnosis not present

## 2019-10-23 DIAGNOSIS — Z79899 Other long term (current) drug therapy: Secondary | ICD-10-CM | POA: Insufficient documentation

## 2019-10-23 DIAGNOSIS — S20219A Contusion of unspecified front wall of thorax, initial encounter: Secondary | ICD-10-CM | POA: Diagnosis not present

## 2019-10-23 DIAGNOSIS — Y999 Unspecified external cause status: Secondary | ICD-10-CM | POA: Insufficient documentation

## 2019-10-23 DIAGNOSIS — Y929 Unspecified place or not applicable: Secondary | ICD-10-CM | POA: Insufficient documentation

## 2019-10-23 DIAGNOSIS — S8002XA Contusion of left knee, initial encounter: Secondary | ICD-10-CM | POA: Diagnosis not present

## 2019-10-23 DIAGNOSIS — S199XXA Unspecified injury of neck, initial encounter: Secondary | ICD-10-CM | POA: Diagnosis present

## 2019-10-23 DIAGNOSIS — F17210 Nicotine dependence, cigarettes, uncomplicated: Secondary | ICD-10-CM | POA: Insufficient documentation

## 2019-10-23 DIAGNOSIS — M5020 Other cervical disc displacement, unspecified cervical region: Secondary | ICD-10-CM | POA: Insufficient documentation

## 2019-10-23 DIAGNOSIS — S130XXA Traumatic rupture of cervical intervertebral disc, initial encounter: Secondary | ICD-10-CM | POA: Insufficient documentation

## 2019-10-23 DIAGNOSIS — Y939 Activity, unspecified: Secondary | ICD-10-CM | POA: Insufficient documentation

## 2019-10-23 DIAGNOSIS — M5030 Other cervical disc degeneration, unspecified cervical region: Secondary | ICD-10-CM
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
## 2019-10-23 MED ORDER — MELOXICAM 15 MG PO TABS: 15.0000 mg | ORAL_TABLET | Freq: Every day | ORAL | 0 refills | Status: DC

## 2019-10-23 MED ORDER — HYDROCODONE-ACETAMINOPHEN 5-325 MG PO TABS
1.0000 | ORAL_TABLET | Freq: Once | ORAL | Status: AC
  Administered 2019-10-23: 1 via ORAL
  Filled 2019-10-23: qty 1

## 2019-10-23 MED ORDER — METHOCARBAMOL 500 MG PO TABS: 500.0000 mg | ORAL_TABLET | Freq: Four times a day (QID) | ORAL | 0 refills | Status: DC

## 2019-10-23 MED ORDER — METHOCARBAMOL 500 MG PO TABS
1000.0000 mg | ORAL_TABLET | Freq: Once | ORAL | Status: AC
  Administered 2019-10-23: 1000 mg via ORAL
  Filled 2019-10-23: qty 2

## 2019-10-23 MED ORDER — HYDROCODONE-ACETAMINOPHEN 5-325 MG PO TABS: 1.0000 | ORAL_TABLET | ORAL | 0 refills | Status: DC | PRN

## 2019-10-23 MED ORDER — OXYCODONE-ACETAMINOPHEN 5-325 MG PO TABS
1.0000 | ORAL_TABLET | Freq: Once | ORAL | Status: AC
  Administered 2019-10-23: 1 via ORAL
  Filled 2019-10-23: qty 1

## 2019-10-23 MED ORDER — MELOXICAM 7.5 MG PO TABS
15.0000 mg | ORAL_TABLET | Freq: Once | ORAL | Status: AC
  Administered 2019-10-23: 15 mg via ORAL
  Filled 2019-10-23: qty 2
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
## 2019-10-23 NOTE — ED Notes (Signed)
Back from MRI  Awaiting results 

## 2019-10-23 NOTE — ED Notes (Signed)
Up to bathroom  Ambulates well   Then take to MRI by MRI staff

## 2019-10-23 NOTE — ED Notes (Signed)
Pt resting  Lights out  Side rails up  Awaiting MRI

## 2019-10-23 NOTE — ED Notes (Signed)
Back from ct /x-ray  Up to bathroom

## 2019-10-23 NOTE — ED Notes (Signed)
Po pain meds given  Pt is waiting for referral info

## 2019-10-23 NOTE — ED Notes (Signed)
Pt has removed c collar, c collar reapplied.
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
## 2019-10-28 ENCOUNTER — Encounter: Payer: Self-pay | Admitting: Emergency Medicine

## 2019-10-28 ENCOUNTER — Emergency Department: Payer: 59

## 2019-10-28 ENCOUNTER — Other Ambulatory Visit: Payer: Self-pay

## 2019-10-28 ENCOUNTER — Emergency Department: Payer: 59 | Attending: Emergency Medicine

## 2019-10-28 ENCOUNTER — Emergency Department
Admission: EM | Admit: 2019-10-28 | Discharge: 2019-10-28 | Disposition: A | Discharge status: Home / Self Care | Payer: Worker's Compensation | Attending: Emergency Medicine | Admitting: Emergency Medicine

## 2019-10-28 DIAGNOSIS — R0789 Other chest pain: Secondary | ICD-10-CM

## 2019-10-28 DIAGNOSIS — R0781 Pleurodynia: Secondary | ICD-10-CM

## 2019-10-28 DIAGNOSIS — Z79899 Other long term (current) drug therapy: Secondary | ICD-10-CM | POA: Insufficient documentation

## 2019-10-28 DIAGNOSIS — F17210 Nicotine dependence, cigarettes, uncomplicated: Secondary | ICD-10-CM | POA: Insufficient documentation

## 2019-10-28 DIAGNOSIS — R52 Pain, unspecified: Secondary | ICD-10-CM

## 2019-10-28 DIAGNOSIS — M542 Cervicalgia: Secondary | ICD-10-CM
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
## 2019-10-28 NOTE — Discharge Instructions (Addendum)
Keep your appointment with Dr. Cari Caraway tomorrow.  Use your cervical collar as needed for comfort.  Also you may use ice or heat to your ribs as needed for discomfort.  Continue with your medications except for when you are driving.
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
## 2020-06-01 ENCOUNTER — Telehealth: Payer: Self-pay

## 2020-06-01 NOTE — Telephone Encounter (Signed)
Patient needs to establish care with new provider, or schedule a cpe before mammogram can be ordered

## 2020-06-01 NOTE — Telephone Encounter (Signed)
Copied from [place] [id number]. Topic: General - Other >> Jun 01, 2020 12:30 PM Leward Quan A wrote: Reason for CRM: Patient called to request a referral to have her Mammogram done she was informed that she may need to be seen first but wanted to know from the office. Asking for a call back with an answer please at Ph# [phone]

## 2020-06-01 NOTE — Telephone Encounter (Signed)
Called pt to get her scheduled with [name] for Monday. Pt states she could not come because she has to work. She ask to call if we get a cancellation.
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
## 2020-06-06 ENCOUNTER — Other Ambulatory Visit: Payer: Self-pay

## 2020-06-06 ENCOUNTER — Encounter: Payer: Self-pay | Admitting: Family Medicine

## 2020-06-06 ENCOUNTER — Ambulatory Visit (INDEPENDENT_AMBULATORY_CARE_PROVIDER_SITE_OTHER): Payer: 59 | Admitting: Family Medicine

## 2020-06-06 VITALS — BP 120/68 | HR 85 | Temp 97.9°F | Resp 14 | Ht 65.0 in | Wt 128.8 lb

## 2020-06-06 DIAGNOSIS — I700 Atherosclerosis of aorta: Secondary | ICD-10-CM | POA: Diagnosis not present

## 2020-06-06 DIAGNOSIS — Z1231 Encounter for screening mammogram for malignant neoplasm of breast: Secondary | ICD-10-CM

## 2020-06-06 DIAGNOSIS — E785 Hyperlipidemia, unspecified: Secondary | ICD-10-CM | POA: Diagnosis not present

## 2020-06-06 DIAGNOSIS — Z720 Tobacco use: Secondary | ICD-10-CM

## 2020-06-06 DIAGNOSIS — M810 Age-related osteoporosis without current pathological fracture: Secondary | ICD-10-CM

## 2020-06-06 DIAGNOSIS — R718 Other abnormality of red blood cells: Secondary | ICD-10-CM

## 2020-06-06 DIAGNOSIS — E559 Vitamin D deficiency, unspecified: Secondary | ICD-10-CM

## 2020-06-06 DIAGNOSIS — Z5181 Encounter for therapeutic drug level monitoring: Secondary | ICD-10-CM

## 2020-06-06 LAB — COMPLETE METABOLIC PANEL WITH GFR
AG Ratio: 2 (calc) (ref 1.0–2.5)
ALT: 13 U/L (ref 6–29)
AST: 15 U/L (ref 10–35)
Albumin: 4.3 g/dL (ref 3.6–5.1)
Alkaline phosphatase (APISO): 76 U/L (ref 31–125)
BUN: 10 mg/dL (ref 7–25)
CO2: 27 mmol/L (ref 20–32)
Calcium: 9.6 mg/dL (ref 8.6–10.2)
Chloride: 105 mmol/L (ref 98–110)
Creat: 0.74 mg/dL (ref 0.50–1.10)
GFR, Est African American: 110 mL/min/{1.73_m2} (ref >=60)
GFR, Est Non African American: 95 mL/min/{1.73_m2} (ref >=60)
Globulin: 2.2 g/dL (calc) (ref 1.9–3.7)
Glucose, Bld: 79 mg/dL (ref 65–99)
Potassium: 5.3 mmol/L (ref 3.5–5.3)
Sodium: 140 mmol/L (ref 135–146)
Total Bilirubin: 0.4 mg/dL (ref 0.2–1.2)
Total Protein: 6.5 g/dL (ref 6.1–8.1)

## 2020-06-06 LAB — CBC WITH DIFFERENTIAL/PLATELET
Absolute Monocytes: 672 cells/uL (ref 200–950)
Basophils Absolute: 102 cells/uL (ref 0–200)
Basophils Relative: 1.4 %
Eosinophils Absolute: 131 cells/uL (ref 15–500)
Eosinophils Relative: 1.8 %
HCT: 44.6 % (ref 35.0–45.0)
Hemoglobin: 15.1 g/dL (ref 11.7–15.5)
Lymphs Abs: 2562 cells/uL (ref 850–3900)
MCH: 31.7 pg (ref 27.0–33.0)
MCHC: 33.9 g/dL (ref 32.0–36.0)
MCV: 93.5 fL (ref 80.0–100.0)
MPV: 9.7 fL (ref 7.5–12.5)
Monocytes Relative: 9.2 %
Neutro Abs: 3833 cells/uL (ref 1500–7800)
Neutrophils Relative %: 52.5 %
Platelets: 317 10*3/uL (ref 140–400)
RBC: 4.77 10*6/uL (ref 3.80–5.10)
RDW: 12.9 % (ref 11.0–15.0)
Total Lymphocyte: 35.1 %
WBC: 7.3 10*3/uL (ref 3.8–10.8)

## 2020-06-06 LAB — LIPID PANEL
Cholesterol: 219 mg/dL — ABNORMAL HIGH (ref <200)
HDL: 38 mg/dL — ABNORMAL LOW (ref >=50)
LDL Cholesterol (Calc): 146 mg/dL (calc) — ABNORMAL HIGH
Non-HDL Cholesterol (Calc): 181 mg/dL (calc) — ABNORMAL HIGH (ref <130)
Total CHOL/HDL Ratio: 5.8 (calc) — ABNORMAL HIGH (ref <5.0)
Triglycerides: 210 mg/dL — ABNORMAL HIGH (ref <150)

## 2020-06-06 NOTE — Telephone Encounter (Signed)
Copied from [place] [id number]. Topic: Referral - Question >> Jun 06, 2020 11:14 AM Kathryn Fuller wrote: Reason for CRM: Pt called to make her mammogram appt at [facility], In breast center and she has changing in her breasts and needs a diagnostic mammogram possible ultrasound as well. Pt saw leisa today

## 2020-06-06 NOTE — Patient Instructions (Addendum)
The 10-year ASCVD risk score Mikey Bussing DC Brooke Bonito., et al., 2013) is: 5%   Values used to calculate the score:     Age: 49 years     Sex: Female     Is Non-Hispanic African American: No     Diabetic: No     Tobacco smoker: Yes     Systolic Blood Pressure: 254 mmHg     Is BP treated: No     HDL Cholesterol: 45 mg/dL     Total Cholesterol: 243 mg/dL  Recommendations on cholesterol and starting statins.  There is a benefit from LDL-C (bad cholesterol) lowering with statin therapy at virtually all levels of cardiovascular risk.  If statin therapy had no side effects and caused no financial burden, it might be reasonable to recommend it to virtually all at-risk individuals, similar to a healthy diet and exercise  It is this good of a medication!!  It reduces risk in almost everyone.   There are possible side effects with ALL medications and with statins there is a small subset of the population who may not metabolize it well, which causing muscle aches as a side effect (~5%).  We monitor for this, can test for this, and usually are careful to work with you to get a medication that gives you the benefits with minimal side effects.  Some people are sensitive to medications in general and we try to use the highest dose tolerated to give the most benefit.   [facility] cholesterol and statin guidelines are as follows: In adults 6 to 50 years of age without diabetes mellitus and with LDL-C levels ?86, at a 10-year atherosclerotic cardiovascular disease risk of ?7.5 percent, start a moderate-intensity statin if a discussion of treatment options favors statin therapy  If LDL is >160, statins are indicated.  Patients with other significant risk factors would also benefit from statin.  Some of these factors include a family history of premature cardiovascular disease, chronic kidney disease, or chronic inflammatory disorder (such as chronic human immunodeficiency viral  infection).   Can get more information at the following website:  PromotionalLoans.co.za    Preventing Osteoporosis, Adult Osteoporosis is a condition that causes the bones to lose density. This means that the bones become thinner, and the normal spaces in bone tissue become larger. Low bone density can make the bones weak and cause them to break more easily. Osteoporosis cannot always be prevented, but you can take steps to lower your risk of developing this condition. How can this condition affect me? If you develop osteoporosis, you will be more likely to break bones in your wrist, spine, or hip. Even a minor accident or injury can be enough to break weak bones. The bones will also be slower to heal. Osteoporosis can cause other problems as well, such as a stooped posture or trouble with movement. Osteoporosis can occur with aging. As you get older, you may lose bone tissue more quickly, or it may be replaced more slowly. Osteoporosis is more likely to develop if you have poor nutrition or do not get enough calcium or vitamin D. Other lifestyle factors can also play a role. By eating a well-balanced diet and making lifestyle changes, you can help keep your bones strong and healthy, lowering your chances of developing osteoporosis. What can increase my risk? The following factors may make you more likely to develop osteoporosis:  Having a family history of the condition.  Having poor nutrition or not getting enough  calcium or vitamin D.  Using certain medicines, such as steroid medicines or antiseizure medicines.  Being any of the following: ? 48 years of age or older. ? Female. ? A woman who has gone through menopause (is postmenopausal). ? White (Caucasian) or of Asian descent.  Smoking or having a history of smoking.  Not being physically active (being sedentary).  Having a small body frame. What actions can I take to  prevent this?  Get enough calcium   Make sure you get enough calcium every day. Calcium is the most important mineral for bone health. Most people can get enough calcium from their diet, but supplements may be recommended for people who are at risk for osteoporosis. Follow these guidelines: ? If you are age 24 or younger, aim to get 1,000 mg of calcium every day. ? If you are older than age 66, aim to get 1,200 mg of calcium every day.  Good sources of calcium include: ? Dairy products, such as low-fat or nonfat milk, cheese, and yogurt. ? Dark green leafy vegetables, such as bok choy and broccoli. ? Foods that have had calcium added to them (calcium-fortified foods), such as orange juice, cereal, bread, soy beverages, and tofu products. ? Nuts, such as almonds.  Check nutrition labels to see how much calcium is in a food or drink. Get enough vitamin D  Try to get enough vitamin D every day. Vitamin D is the most essential vitamin for bone health. It helps the body absorb calcium. Follow these guidelines for how much vitamin D to get from food: ? If you are age 48 or younger, aim to get at least 600 international units (IU) every day. Your health care provider may suggest more. ? If you are older than age 76, aim to get at least 800 international units every day. Your health care provider may suggest more.  Good sources of vitamin D in your diet include: ? Egg yolks. ? Oily fish, such as salmon, sardines, and tuna. ? Milk and cereal fortified with vitamin D.  Your body also makes vitamin D when you are out in the sun. Exposing the bare skin on your face, arms, legs, or back to the sun for no more than 30 minutes a day, 2 times a week is more than enough. Beyond that, make sure you use sunblock to protect your skin from sunburn, which increases your risk for skin cancer. Exercise  Stay active and get exercise every day.  Ask your health care provider what types of exercise are best for  you. Weight-bearing and strength-building activities are important for building and maintaining healthy bones. Some examples of these types of activities include: ? Walking and hiking. ? Jogging and running. ? Dancing. ? Gym exercises. ? Lifting weights. ? Tennis and racquetball. ? Climbing stairs. ? Aerobics. Make other lifestyle changes  Do not use any products that contain nicotine or tobacco, such as cigarettes, e-cigarettes, and chewing tobacco. If you need help quitting, ask your health care provider.  Lose weight if you are overweight.  If you drink alcohol: ? Limit how much you use to:  0-1 drink a day for nonpregnant women.  0-2 drinks a day for men. ? Be aware of how much alcohol is in your drink. In the [place] one drink equals one 12 oz bottle of beer (355 mL), one 5 oz glass of wine (148 mL), or one 1 oz glass of hard liquor (44 mL). Where to find support If you  need help making changes to prevent osteoporosis, talk with your health care provider. You can ask for a referral to a diet and nutrition specialist (dietitian) and a physical therapist. Where to find more information Learn more about osteoporosis from:  NIH Osteoporosis and Related [place]: www.bones.SouthExposed.es  [place] Office on [employer]: [place]Signs.tn  [place]: EquipmentWeekly.com.ee Summary  Osteoporosis is a condition that causes weak bones that are more likely to break.  Eat a healthy diet, making sure you get enough calcium and vitamin D, and stay active by getting regular exercise to help prevent osteoporosis.  Other ways to reduce your risk of osteoporosis include maintaining a healthy weight and avoiding alcohol and products that contain nicotine or tobacco. This information is not intended to replace advice given to you by your health care provider. Make sure you discuss any questions you have with your health care provider. Document Revised:  03/27/2019 Document Reviewed: 03/27/2019 Elsevier Patient Education  [place].

## 2020-06-06 NOTE — Telephone Encounter (Signed)
Pt notified, no changes addressed with breast at appointment.  If having problems she will need to schedule an appt, otherwise do regular screening

## 2020-06-06 NOTE — Telephone Encounter (Signed)
Did you all discuss changes in her breast?  I dont see anything in chart about it?

## 2020-06-13 ENCOUNTER — Encounter: Payer: Self-pay | Admitting: Family Medicine

## 2020-08-01 ENCOUNTER — Other Ambulatory Visit: Payer: Self-pay

## 2020-08-01 ENCOUNTER — Ambulatory Visit
Admission: RE | Admit: 2020-08-01 | Discharge: 2020-08-01 | Disposition: A | Discharge status: Home / Self Care | Payer: 59 | Source: Ambulatory Visit | Attending: Family Medicine | Admitting: Family Medicine

## 2020-08-01 DIAGNOSIS — Z1231 Encounter for screening mammogram for malignant neoplasm of breast: Secondary | ICD-10-CM | POA: Insufficient documentation

## 2020-08-01 IMAGING — MG DIGITAL SCREENING BILAT W/ TOMO W/ CAD
8 series · 8 of 24 positions shown · non-contrast
Comparison: Previous exams.

CLINICAL DATA: Screening.

EXAM:
DIGITAL SCREENING BILATERAL MAMMOGRAM WITH TOMO AND CAD

[L CC synth-2D]
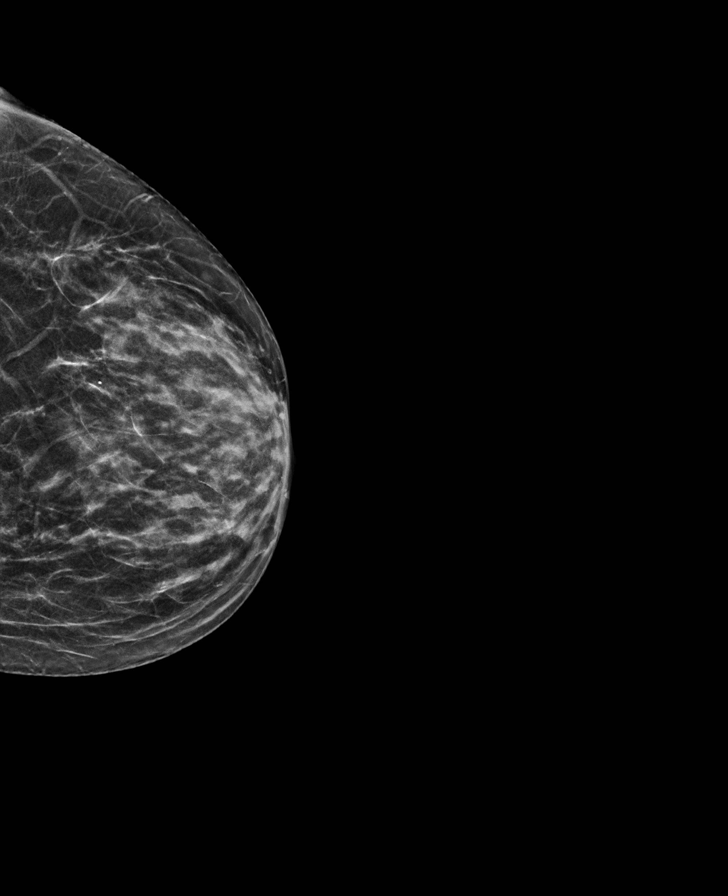

[R MLO synth-2D]
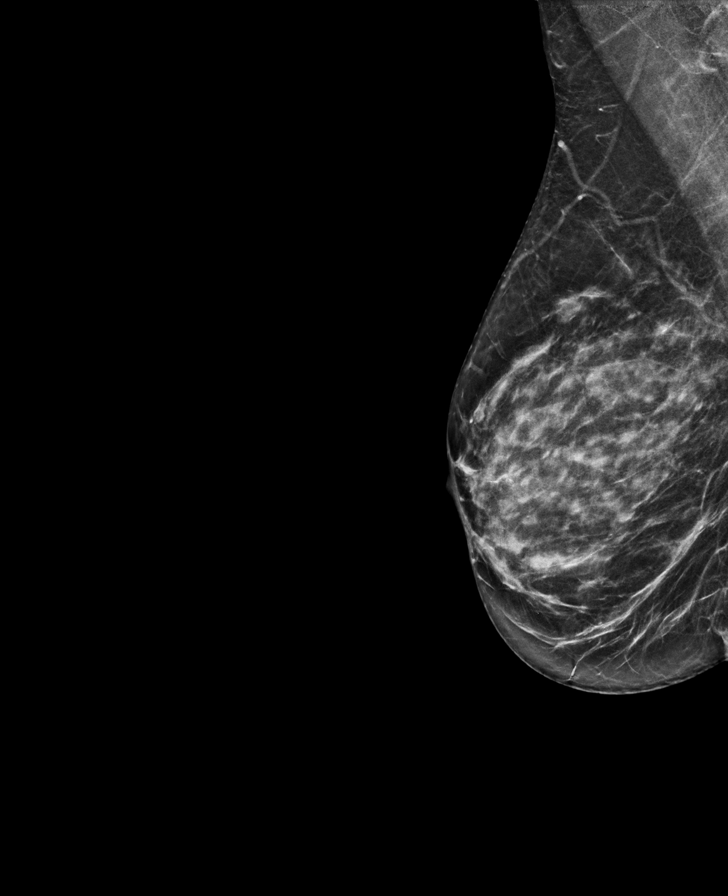

[L MLO synth-2D]
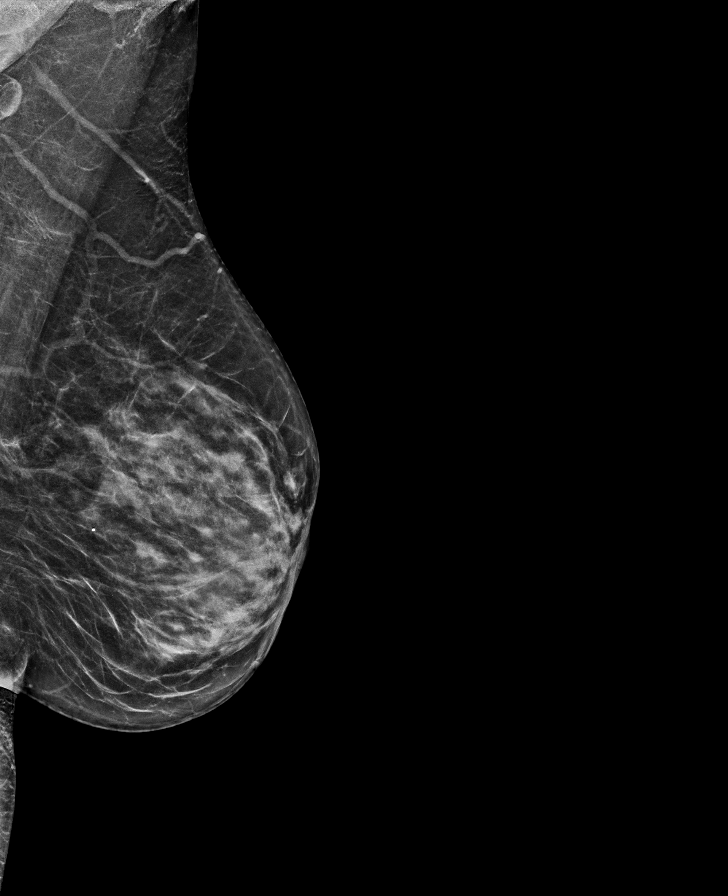

[R CC synth-2D]
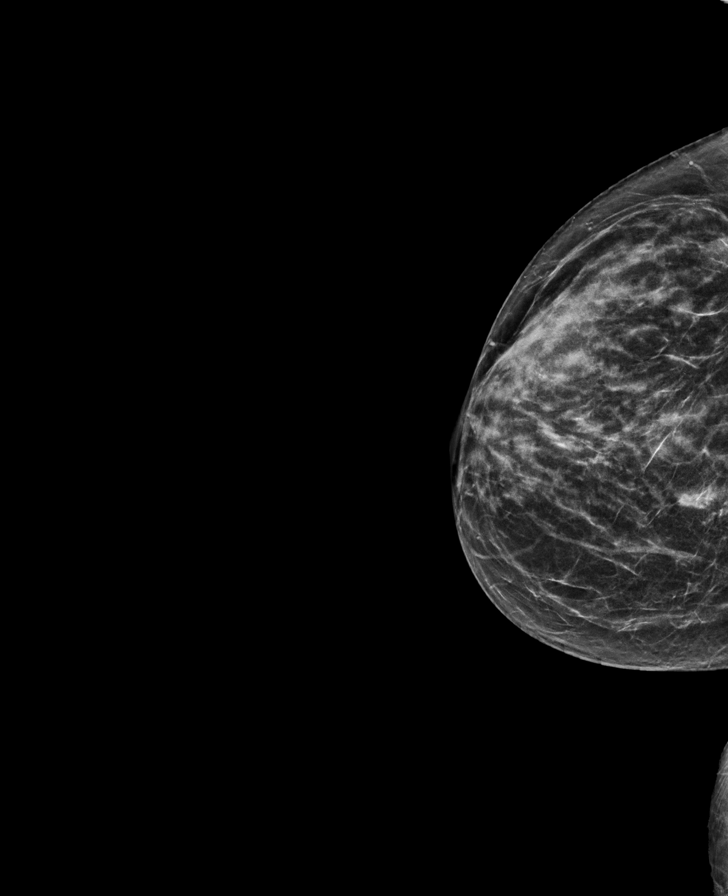

[R CC tomo · tomo slice 27/54.0]
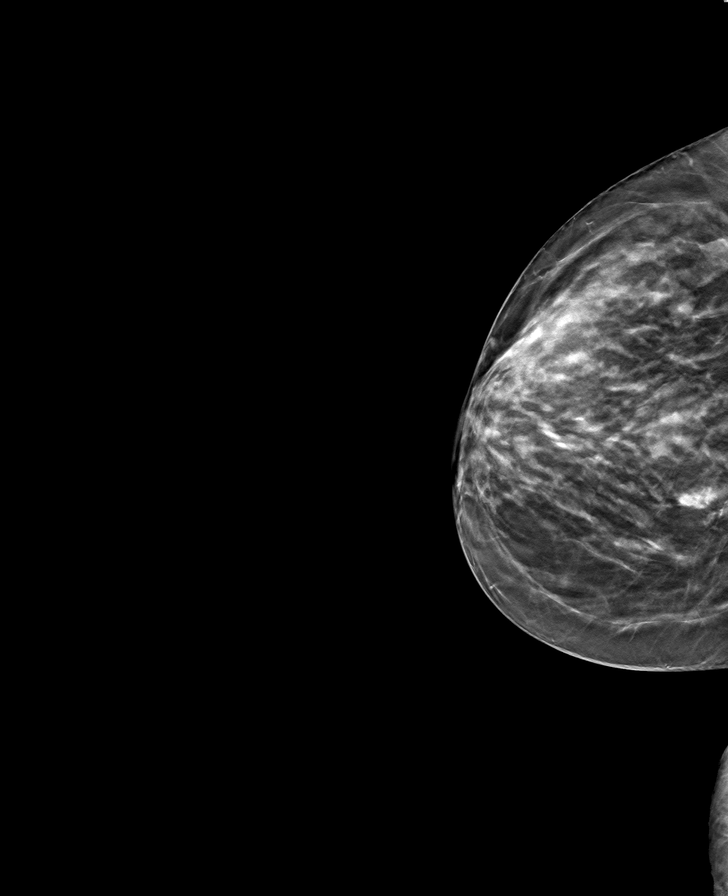

[R MLO tomo · tomo slice 31/60.0]
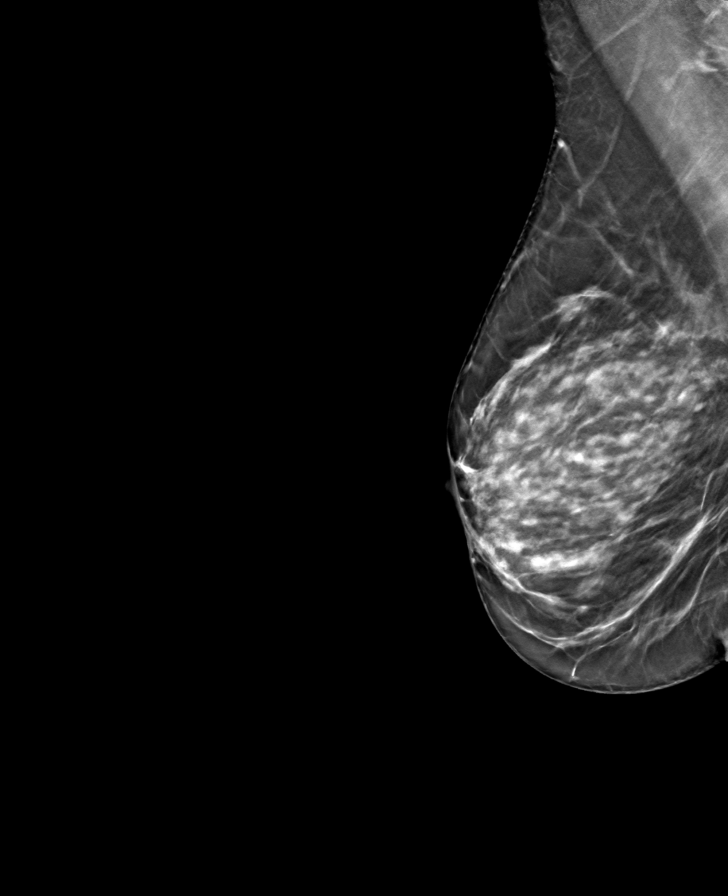

[L CC tomo · tomo slice 25/48.0]
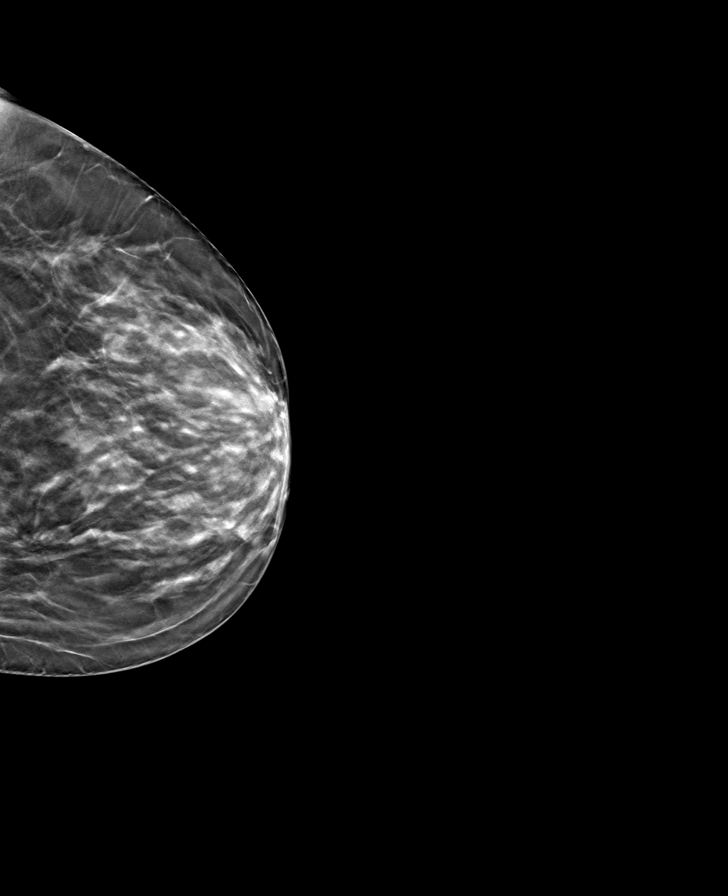

[L MLO tomo · tomo slice 31/61.0]
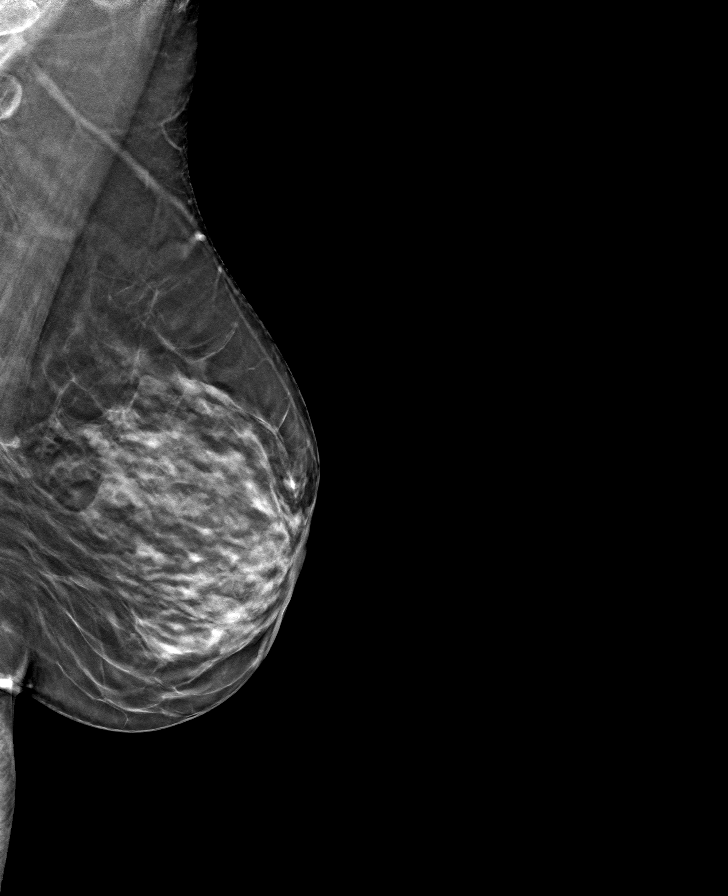

[8 of 24 positions shown; findings below may reference images not displayed]

ACR Breast Density Category c: The breast tissue is heterogeneously
dense, which may obscure small masses.
FINDINGS: In the right breast an asymmetry requires further evaluation.

In the left breast asymmetries requires further evaluation.

Images were processed with CAD.
IMPRESSION: Further evaluation is suggested for possible asymmetry in the right
breast.

Further evaluation is suggested for possible asymmetries in the left
breast.

RECOMMENDATION:
Diagnostic mammogram and possibly ultrasound of both breasts.
(Code:[9E])

The patient will be contacted regarding the findings, and additional
imaging will be scheduled.

BI-RADS CATEGORY  0: Incomplete. Need additional imaging evaluation
and/or prior mammograms for comparison.

## 2020-08-08 ENCOUNTER — Other Ambulatory Visit: Payer: Self-pay | Admitting: Family Medicine

## 2020-08-08 ENCOUNTER — Telehealth: Payer: Self-pay | Admitting: Family Medicine

## 2020-08-08 DIAGNOSIS — N6489 Other specified disorders of breast: Secondary | ICD-10-CM

## 2020-08-08 DIAGNOSIS — R928 Other abnormal and inconclusive findings on diagnostic imaging of breast: Secondary | ICD-10-CM

## 2020-08-08 NOTE — Telephone Encounter (Signed)
Patient is calling questions she saw on her mychart that there was an Abnormal finding on mammography. She would like to know what is the next step? And she thought that a telephone call was supposed to be made when there is an abnormal finding. Please advise [phone]- [phone]

## 2020-08-09 NOTE — Telephone Encounter (Signed)
Left message for patient that any abnormal findings on mammogram the the breast center will contact her with any additional appointments such as xrays or [place].

## 2020-08-10 NOTE — Telephone Encounter (Signed)
Kathryn Fuller at [facility] will reach out to patient for additional testing

## 2020-08-17 ENCOUNTER — Ambulatory Visit
Admission: RE | Admit: 2020-08-17 | Discharge: 2020-08-17 | Disposition: A | Discharge status: Home / Self Care | Payer: 59 | Source: Ambulatory Visit | Attending: Family Medicine | Admitting: Family Medicine

## 2020-08-17 ENCOUNTER — Other Ambulatory Visit: Payer: Self-pay

## 2020-08-17 DIAGNOSIS — R928 Other abnormal and inconclusive findings on diagnostic imaging of breast: Secondary | ICD-10-CM

## 2020-08-17 DIAGNOSIS — N6489 Other specified disorders of breast: Secondary | ICD-10-CM

## 2020-08-17 IMAGING — MG DIGITAL DIAGNOSTIC BILAT W/ TOMO W/ CAD
8 of 15 series · 8 of 40 positions shown · non-contrast
Comparison: Previous exam(s).

CLINICAL DATA: 49-year-old female recalled from screening mammogram
dated [DATE] for possible bilateral asymmetries.

EXAM:
DIGITAL DIAGNOSTIC BILATERAL MAMMOGRAM WITH CAD AND TOMO
ULTRASOUND RIGHT BREAST

[R CC synth-2D (1 of 2)]
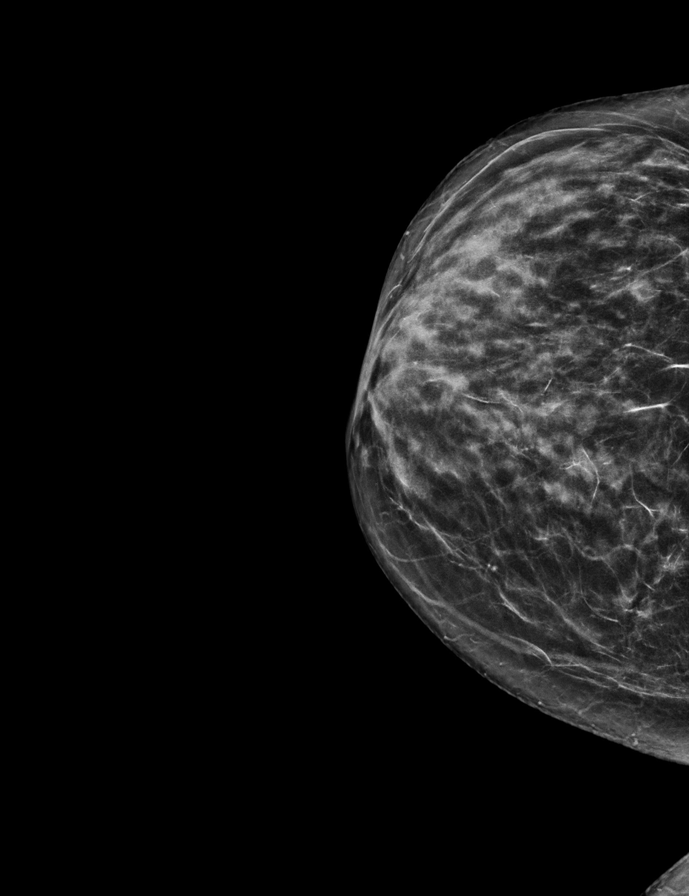

[R CC synth-2D (2 of 2)]
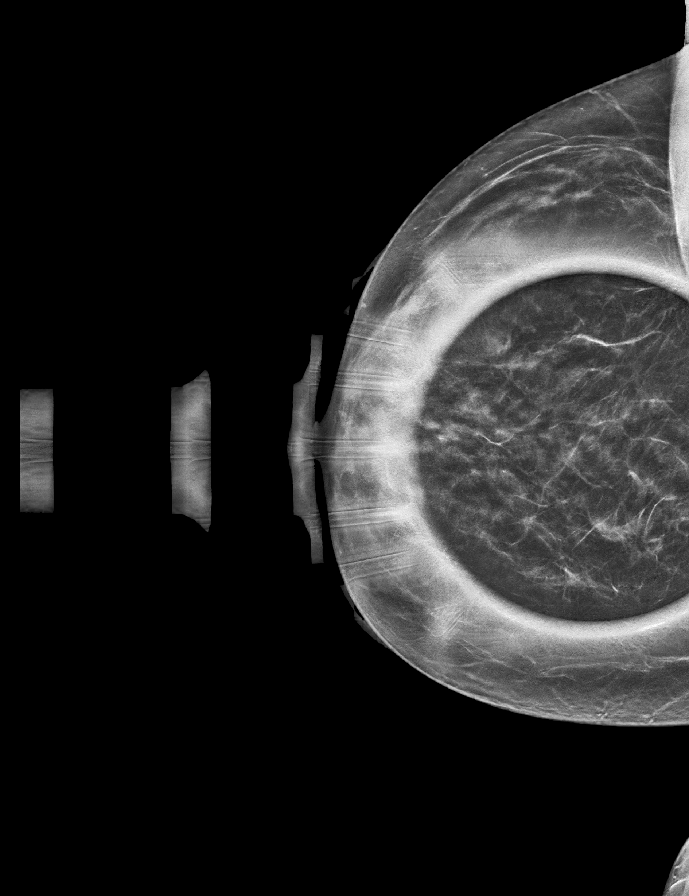

[L ML synth-2D]
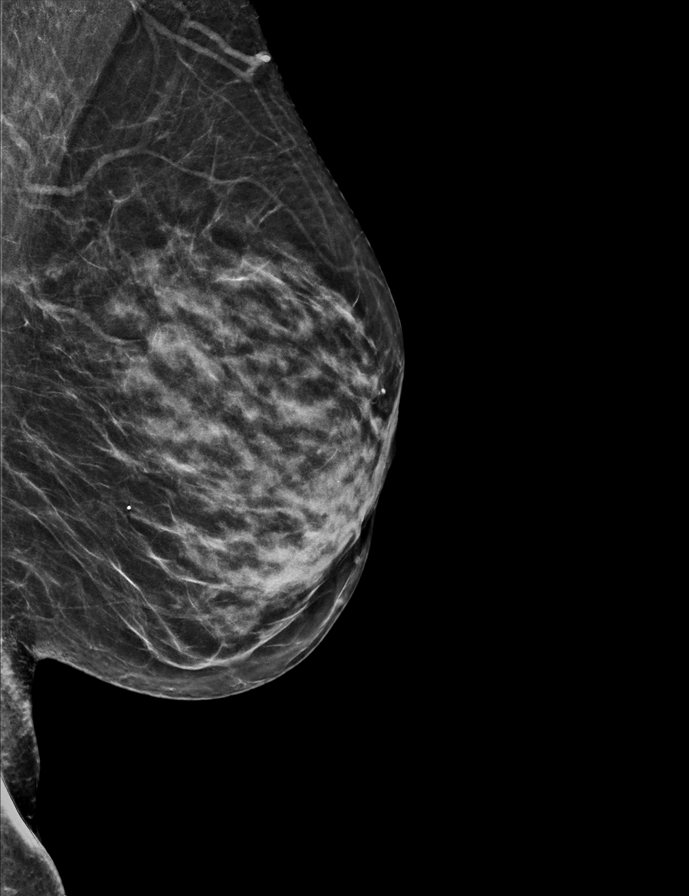

[L CC synth-2D (1 of 2)]
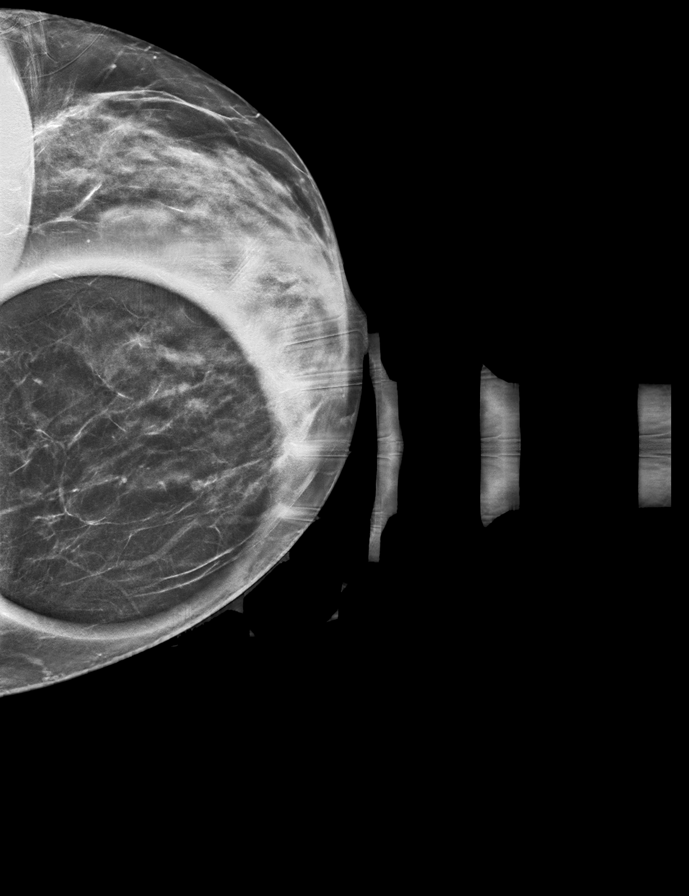

[R XCCM synth-2D]
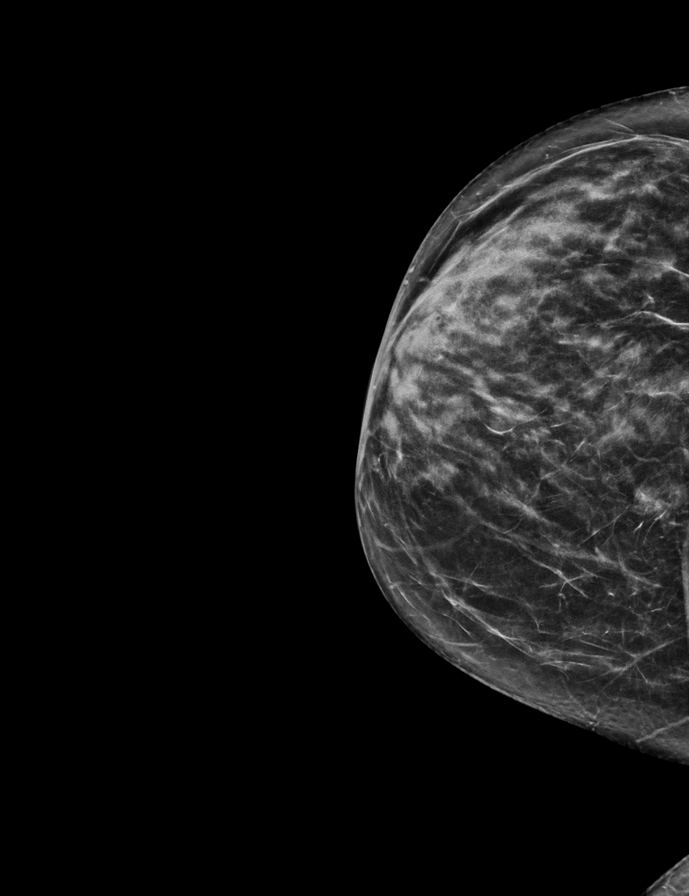

[L CC synth-2D (2 of 2)]
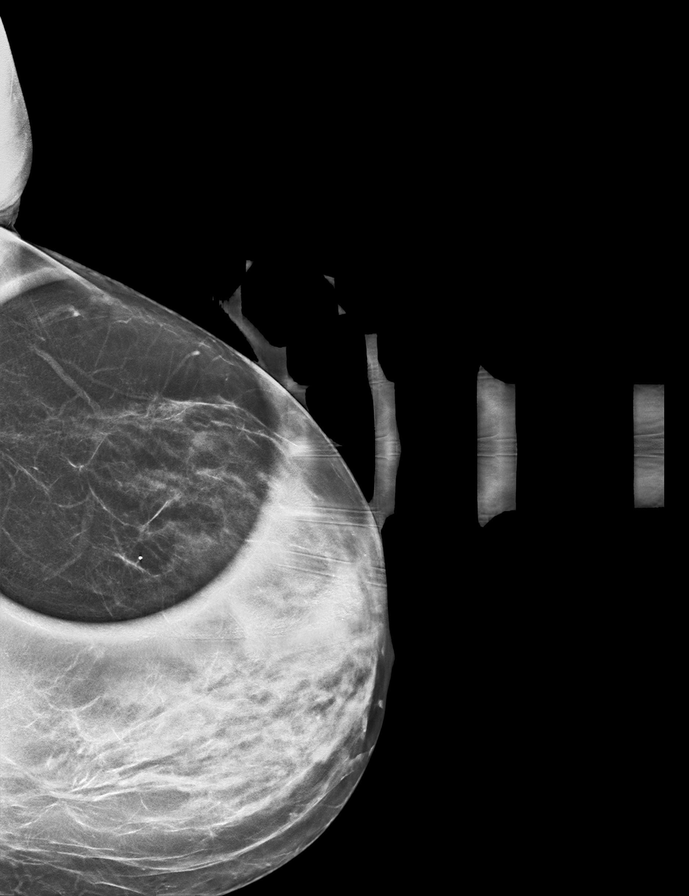

[R MLO synth-2D]
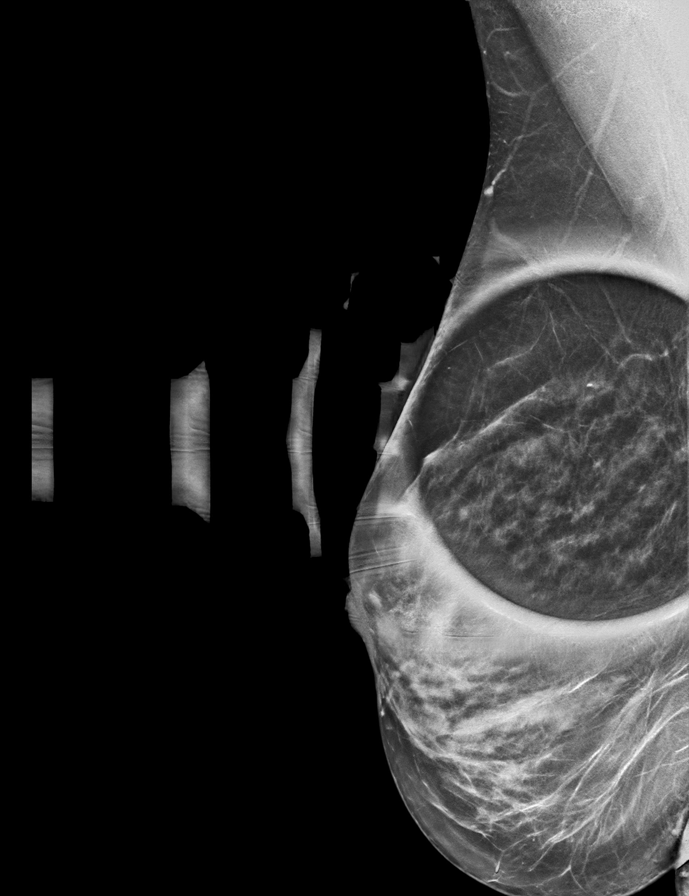

[R XCCM BREAST TOMOSYNTHESIS IMAGE tomo · tomo slice 40/58.0]
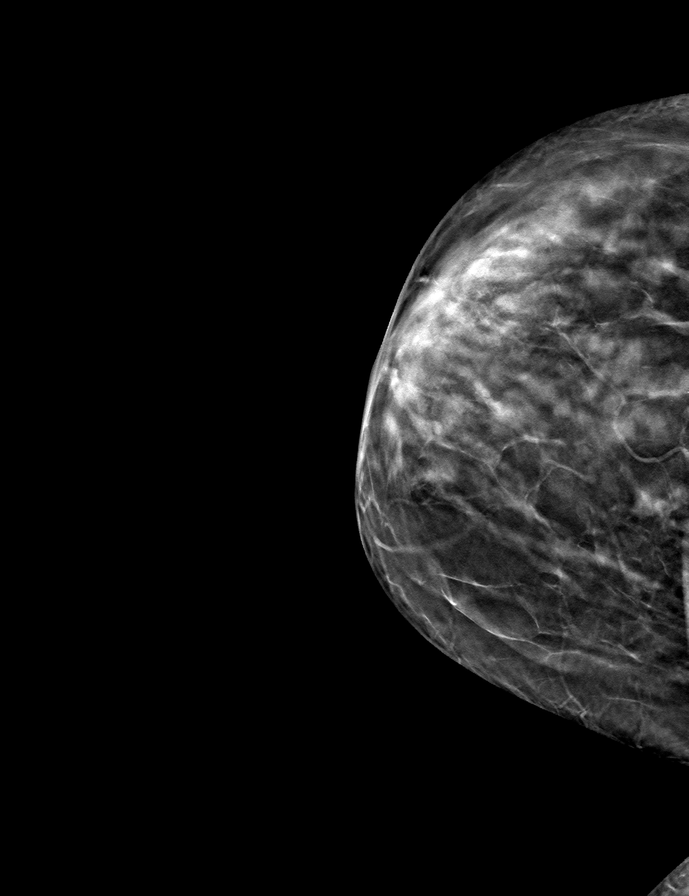

[8 of 40 positions shown; findings below may reference images not displayed]

ACR Breast Density Category c: The breast tissue is heterogeneously
dense, which may obscure small masses.
FINDINGS: The previously described, possible asymmetry in the medial and
lateral left breast at posterior depth is seen on the cc projections
only both resolve into well dispersed fibroglandular tissue on
today's additional views. No suspicious findings are identified.

An asymmetry in the superior right breast at posterior depth on the
MLO projection also resolves into well dispersed fibroglandular
tissue. An asymmetry in the slightly medial right breast at far
posterior depth partially effaces on today's views. Further
evaluation of this area was performed with ultrasound.

Mammographic images were processed with CAD.

Targeted ultrasound is performed, showing normal fibroglandular
tissue without focal or suspicious sonographic abnormality.
Extensive evaluation of the deep central right breast was performed.
IMPRESSION: 1. Probably benign right breast asymmetry in the slightly medial
right breast at far posterior depth as seen on the CC projection
only. This partially effaces on additional views and does not have
an ultrasound correlate. Recommendation is for precautionary
short-term follow-up.
2. No additional persistent findings in either breast.

RECOMMENDATION:
Diagnostic right breast mammogram with possible ultrasound in 6
months.

I have discussed the findings and recommendations with the patient.
If applicable, a reminder letter will be sent to the patient
regarding the next appointment.

BI-RADS CATEGORY  3: Probably benign.

## 2020-08-17 IMAGING — US US BREAST*R* LIMITED INC AXILLA
1 series · 2 of 2 positions shown · non-contrast
Comparison: Previous exam(s).

CLINICAL DATA: 49-year-old female recalled from screening mammogram
dated [DATE] for possible bilateral asymmetries.

EXAM:
DIGITAL DIAGNOSTIC BILATERAL MAMMOGRAM WITH CAD AND TOMO
ULTRASOUND RIGHT BREAST

[Series 1: us breast*right* limited inc axilla · 0.08mm/px · 2 of 2 slices shown]
[im 1/2]
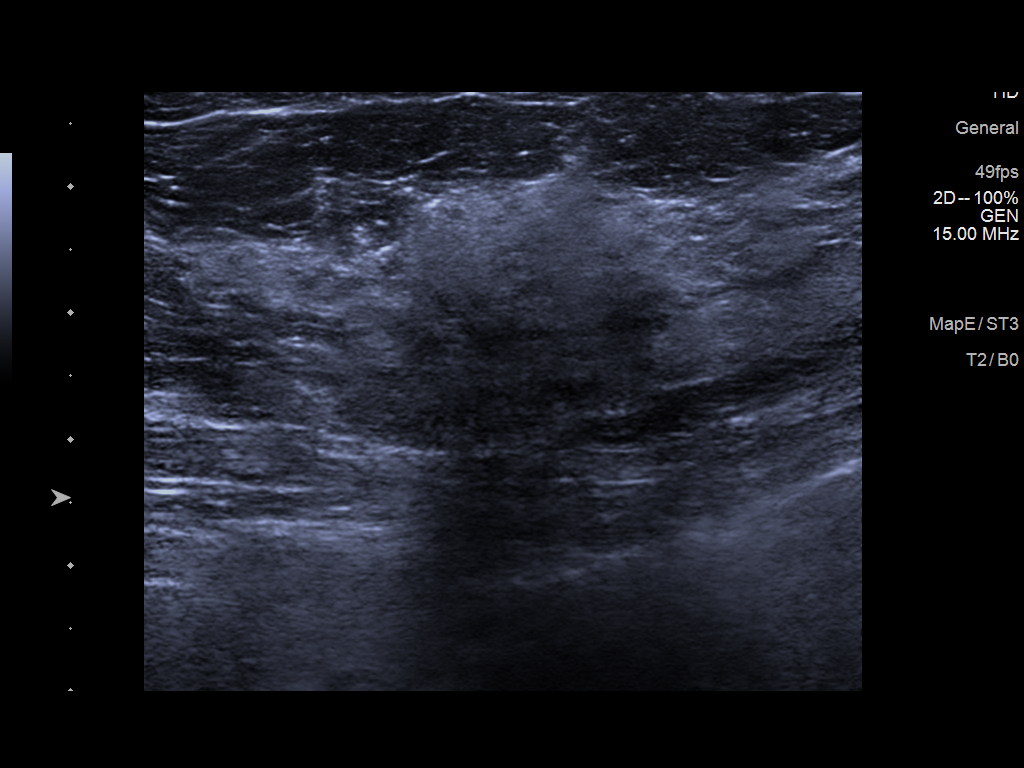
[im 2/2]
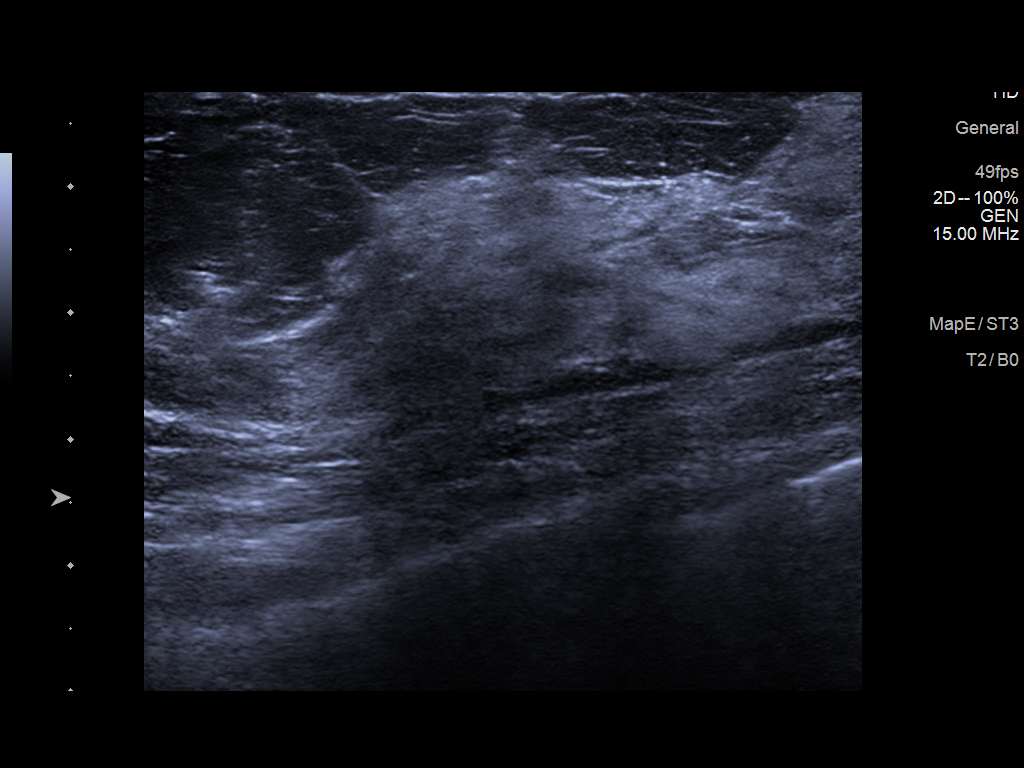

[2 of 2 positions shown; findings below may reference images not displayed]

ACR Breast Density Category c: The breast tissue is heterogeneously
dense, which may obscure small masses.
FINDINGS: The previously described, possible asymmetry in the medial and
lateral left breast at posterior depth is seen on the cc projections
only both resolve into well dispersed fibroglandular tissue on
today's additional views. No suspicious findings are identified.

An asymmetry in the superior right breast at posterior depth on the
MLO projection also resolves into well dispersed fibroglandular
tissue. An asymmetry in the slightly medial right breast at far
posterior depth partially effaces on today's views. Further
evaluation of this area was performed with ultrasound.

Mammographic images were processed with CAD.

Targeted ultrasound is performed, showing normal fibroglandular
tissue without focal or suspicious sonographic abnormality.
Extensive evaluation of the deep central right breast was performed.
IMPRESSION: 1. Probably benign right breast asymmetry in the slightly medial
right breast at far posterior depth as seen on the CC projection
only. This partially effaces on additional views and does not have
an ultrasound correlate. Recommendation is for precautionary
short-term follow-up.
2. No additional persistent findings in either breast.

RECOMMENDATION:
Diagnostic right breast mammogram with possible ultrasound in 6
months.

I have discussed the findings and recommendations with the patient.
If applicable, a reminder letter will be sent to the patient
regarding the next appointment.

BI-RADS CATEGORY  3: Probably benign.

## 2020-09-05 ENCOUNTER — Encounter: Payer: 59 | Admitting: Family Medicine

## 2020-09-16 ENCOUNTER — Other Ambulatory Visit: Payer: Self-pay | Admitting: Family Medicine

## 2020-09-16 DIAGNOSIS — N6489 Other specified disorders of breast: Secondary | ICD-10-CM

## 2020-09-16 DIAGNOSIS — R928 Other abnormal and inconclusive findings on diagnostic imaging of breast: Secondary | ICD-10-CM

## 2020-09-22 ENCOUNTER — Other Ambulatory Visit: Payer: 59

## 2020-09-22 DIAGNOSIS — Z20822 Contact with and (suspected) exposure to covid-19: Secondary | ICD-10-CM

## 2020-09-24 LAB — SARS-COV-2, NAA 2 DAY TAT

## 2020-09-24 LAB — NOVEL CORONAVIRUS, NAA: SARS-CoV-2, NAA: NOT DETECTED

## 2020-10-04 NOTE — Patient Instructions (Signed)
Health Maintenance  Topic Date Due  .  Hepatitis C: One time screening is recommended by Center for Disease Control  (CDC) for  adults born from [birth year] through 1966.   Never done  . COVID-19 Vaccine (3 - Booster for Moderna series) 08/24/2020  . Flu Shot  12/08/2020*  . Mammogram  08/17/2021  . Tetanus Vaccine  12/09/2021  . Colon Cancer Screening  01/31/2022  . HIV Screening  Completed  . Pap Smear  Discontinued  *Topic was postponed. The date shown is not the original due date.     Preventive Care 24-69 Years Old, Female Preventive care refers to lifestyle choices and visits with your health care provider that can promote health and wellness. This includes:  A yearly physical exam. This is also called an annual wellness visit.  Regular dental and eye exams.  Immunizations.  Screening for certain conditions.  Healthy lifestyle choices, such as: ? Eating a healthy diet. ? Getting regular exercise. ? Not using drugs or products that contain nicotine and tobacco. ? Limiting alcohol use. What can I expect for my preventive care visit? Physical exam Your health care provider will check your:  Height and weight. These may be used to calculate your BMI (body mass index). BMI is a measurement that tells if you are at a healthy weight.  Heart rate and blood pressure.  Body temperature.  Skin for abnormal spots. Counseling Your health care provider may ask you questions about your:  Past medical problems.  Family's medical history.  Alcohol, tobacco, and drug use.  Emotional well-being.  Home life and relationship well-being.  Sexual activity.  Diet, exercise, and sleep habits.  Work and work Statistician.  Access to firearms.  Method of birth control.  Menstrual cycle.  Pregnancy history. What immunizations do I need? Vaccines are usually given at various ages, according to a schedule. Your health care provider will recommend vaccines for you based on your  age, medical history, and lifestyle or other factors, such as travel or where you work.   What tests do I need? Blood tests  Lipid and cholesterol levels. These may be checked every 5 years, or more often if you are over 82 years old.  Hepatitis C test.  Hepatitis B test. Screening  Lung cancer screening. You may have this screening every year starting at age 36 if you have a 30-pack-year history of smoking and currently smoke or have quit within the past 15 years.  Colorectal cancer screening. ? All adults should have this screening starting at age 67 and continuing until age 70. ? Your health care provider may recommend screening at age 67 if you are at increased risk. ? You will have tests every 1-10 years, depending on your results and the type of screening test.  Diabetes screening. ? This is done by checking your blood sugar (glucose) after you have not eaten for a while (fasting). ? You may have this done every 1-3 years.  Mammogram. ? This may be done every 1-2 years. ? Talk with your health care provider about when you should start having regular mammograms. This may depend on whether you have a family history of breast cancer.  BRCA-related cancer screening. This may be done if you have a family history of breast, ovarian, tubal, or peritoneal cancers.  Pelvic exam and Pap test. ? This may be done every 3 years starting at age 65. ? Starting at age 73, this may be done every 5 years if you  have a Pap test in combination with an HPV test. Other tests  STD (sexually transmitted disease) testing, if you are at risk.  Bone density scan. This is done to screen for osteoporosis. You may have this scan if you are at high risk for osteoporosis. Talk with your health care provider about your test results, treatment options, and if necessary, the need for more tests. Follow these instructions at home: Eating and drinking  Eat a diet that includes fresh fruits and vegetables,  whole grains, lean protein, and low-fat dairy products.  Take vitamin and mineral supplements as recommended by your health care provider.  Do not drink alcohol if: ? Your health care provider tells you not to drink. ? You are pregnant, may be pregnant, or are planning to become pregnant.  If you drink alcohol: ? Limit how much you have to 0-1 drink a day. ? Be aware of how much alcohol is in your drink. In the [place] one drink equals one 12 oz bottle of beer (355 mL), one 5 oz glass of wine (148 mL), or one 1 oz glass of hard liquor (44 mL).   Lifestyle  Take daily care of your teeth and gums. Brush your teeth every morning and night with fluoride toothpaste. Floss one time each day.  Stay active. Exercise for at least 30 minutes 5 or more days each week.  Do not use any products that contain nicotine or tobacco, such as cigarettes, e-cigarettes, and chewing tobacco. If you need help quitting, ask your health care provider.  Do not use drugs.  If you are sexually active, practice safe sex. Use a condom or other form of protection to prevent STIs (sexually transmitted infections).  If you do not wish to become pregnant, use a form of birth control. If you plan to become pregnant, see your health care provider for a prepregnancy visit.  If told by your health care provider, take low-dose aspirin daily starting at age 73.  Find healthy ways to cope with stress, such as: ? Meditation, yoga, or listening to music. ? Journaling. ? Talking to a trusted person. ? Spending time with friends and family. Safety  Always wear your seat belt while driving or riding in a vehicle.  Do not drive: ? If you have been drinking alcohol. Do not ride with someone who has been drinking. ? When you are tired or distracted. ? While texting.  Wear a helmet and other protective equipment during sports activities.  If you have firearms in your house, make sure you follow all gun safety  procedures. What's next?  Visit your health care provider once a year for an annual wellness visit.  Ask your health care provider how often you should have your eyes and teeth checked.  Stay up to date on all vaccines. This information is not intended to replace advice given to you by your health care provider. Make sure you discuss any questions you have with your health care provider. Document Revised: 05/31/2020 Document Reviewed: 05/08/2018 Elsevier Patient Education  2021 [employer].

## 2020-10-04 NOTE — Progress Notes (Signed)
Patient: Kathryn Fuller, Female    DOB: 1970/09/11, 50 y.o.   MRN: [id number] Delsa Grana, PA-C Visit Date: 10/05/2020  Today's Provider: Delsa Grana, PA-C   Chief Complaint  Patient presents with  . Annual Exam   Subjective:   Annual physical exam:  Kathryn Fuller is a 50 y.o. female who presents today for complete physical exam:  Exercise/Activity:   Stays busy, no formal exercise Diet/nutrition:   Fairly healthy Sleep:   Not sleeping well at all  Chronic neck and back pain  Skin changes to upper thighs, moles/lesions across back Lesion to left inner lower eye, enlarged bump, firm, sometimes has some drainage or crusting, she's never seen anyone about it    USPSTF grade A and B recommendations - reviewed and addressed today  Depression:  Phq 9 completed today by patient, was reviewed by me with patient in the room PHQ score is neg, pt feels good PHQ 2/9 Scores 10/05/2020 06/06/2020 01/14/2019 10/13/2018  PHQ - 2 Score 0 0 0 0  PHQ- 9 Score 0 - 0 0   Depression screen [facility] 2/9 10/05/2020 06/06/2020 01/14/2019 10/13/2018 10/07/2017  Decreased Interest 0 0 0 0 0  Down, Depressed, Hopeless 0 0 0 0 0  PHQ - 2 Score 0 0 0 0 0  Altered sleeping 0 - 0 0 -  Tired, decreased energy 0 - 0 0 -  Change in appetite 0 - 0 0 -  Feeling bad or failure about yourself  0 - 0 0 -  Trouble concentrating 0 - 0 0 -  Moving slowly or fidgety/restless 0 - 0 0 -  Suicidal thoughts 0 - 0 0 -  PHQ-9 Score 0 - 0 0 -  Difficult doing work/chores Not difficult at all - Not difficult at all Not difficult at all -    Alcohol screening: [place] Office Visit from 10/05/2020 in [facility]  AUDIT-C Score 0      Immunizations and Health Maintenance: Health Maintenance  Topic Date Due  . Hepatitis C Screening  Never done  . COVID-19 Vaccine (3 - Booster for Moderna series) 08/24/2020  . INFLUENZA VACCINE  12/08/2020 (Originally 04/10/2020)  . MAMMOGRAM  08/17/2021   . TETANUS/TDAP  12/09/2021  . COLONOSCOPY (Pts 45-69yr Insurance coverage will need to be confirmed)  01/31/2022  . HIV Screening  Completed  . PAP SMEAR-Modifier  Discontinued     Hep C Screening:  due  STD testing and prevention (HIV/chl/gon/syphilis):  see above, no additional testing desired by pt today  Intimate partner violence:  denies  Sexual History/Pain during Intercourse: Married  Menstrual History/LMP/Abnormal Bleeding:   Sore with sex, denies AUB - Total hysterectomy 18 years No LMP recorded. Patient has had a hysterectomy.  Incontinence Symptoms:   Mild urge and stress  Breast cancer:  Last Mammogram: *see HM list above BRCA gene screening: none  Cervical cancer screening: n/a Pt denies family hx of cancers - breast, ovarian, uterine, colon:     Osteoporosis:   Discussion on osteoporosis per age, including high calcium and vitamin D supplementation, weight bearing exercises Pt is  supplementing with daily calcium/Vit D. Due - but costly - Bone scan/dexa Roughly experienced menopause at age - surgical more than 18 years ago  Skin cancer:  Hx of skin CA -  NO Discussed atypical lesions   Colorectal cancer:   Colonoscopy is UTD due 2023   Discussed concerning signs and sx of CRC, pt denies  melena hematochezia  Lung cancer:  Declines - however after discussion on what it is she was agreeable to talking to the team Low Dose CT Chest recommended if Age 79-80 years, 30 pack-year currently smoking OR have quit w/in 15years. Patient does qualify.    Social History   Tobacco Use  . Smoking status: Current Every Day Smoker    Packs/day: 1.00    Years: 32.00    Pack years: 32.00    Types: Cigarettes  . Smokeless tobacco: Never Used  Vaping Use  . Vaping Use: Never used  Substance Use Topics  . Alcohol use: No  . Drug use: No     Flowsheet Row Office Visit from 10/05/2020 in [facility]  AUDIT-C Score 0      Family History   Problem Relation Age of Onset  . Diabetes Mother   . Dementia Mother   . Heart disease Father   . Dementia Father   . Heart disease Maternal Uncle   . Heart attack Maternal Uncle   . Heart disease Maternal Grandmother   . Heart attack Maternal Grandmother   . Lupus Other        niece  . Breast cancer Neg Hx   . Cancer Neg Hx   . COPD Neg Hx   . Stroke Neg Hx   . Bladder Cancer Neg Hx   . Kidney cancer Neg Hx   . Prostate cancer Neg Hx      Blood pressure/Hypertension: BP Readings from Last 3 Encounters:  10/05/20 122/72  06/06/20 120/68  10/28/19 103/64    Weight/Obesity: Wt Readings from Last 3 Encounters:  10/05/20 133 lb 11.2 oz (60.6 kg)  06/06/20 128 lb 12.8 oz (58.4 kg)  10/28/19 125 lb (56.7 kg)   BMI Readings from Last 3 Encounters:  10/05/20 22.25 kg/m  06/06/20 21.43 kg/m  10/28/19 21.46 kg/m     Lipids:  Lab Results  Component Value Date   CHOL 219 (H) 06/06/2020   CHOL 243 (H) 10/13/2018   CHOL 215 (H) 10/07/2017   Lab Results  Component Value Date   HDL 38 (L) 06/06/2020   HDL 45 (L) 10/13/2018   HDL 39 (L) 10/07/2017   Lab Results  Component Value Date   LDLCALC 146 (H) 06/06/2020   LDLCALC 169 (H) 10/13/2018   LDLCALC 150 (H) 10/07/2017   Lab Results  Component Value Date   TRIG 210 (H) 06/06/2020   TRIG 144 10/13/2018   TRIG 134 10/07/2017   Lab Results  Component Value Date   CHOLHDL 5.8 (H) 06/06/2020   CHOLHDL 5.4 (H) 10/13/2018   CHOLHDL 5.5 (H) 10/07/2017   No results found for: LDLDIRECT Based on the results of lipid panel his/her cardiovascular risk factor ( using [employer]son )  in the next 10 years is: The 10-year ASCVD risk score Kathryn Fuller DC Kathryn Fuller., et al., 2013) is: 5.7%   Values used to calculate the score:     Age: 61 years     Sex: Female     Is Non-Hispanic African American: No     Diabetic: No     Tobacco smoker: Yes     Systolic Blood Pressure: 765 mmHg     Is BP treated: No     HDL Cholesterol: 38  mg/dL     Total Cholesterol: 219 mg/dL Glucose:  Glucose  Date Value Ref Range Status  06/06/2012 79 65 - 99 mg/dL Final   Glucose, Bld  Date Value  Ref Range Status  06/06/2020 79 65 - 99 mg/dL Final    Comment:    .            Fasting reference interval .   10/13/2018 70 65 - 99 mg/dL Final    Comment:    .            Fasting reference interval .   10/07/2017 78 65 - 139 mg/dL Final    Comment:    .        Non-fasting reference interval .    Hypertension: BP Readings from Last 3 Encounters:  10/05/20 122/72  06/06/20 120/68  10/28/19 103/64   Obesity: Wt Readings from Last 3 Encounters:  10/05/20 133 lb 11.2 oz (60.6 kg)  06/06/20 128 lb 12.8 oz (58.4 kg)  10/28/19 125 lb (56.7 kg)   BMI Readings from Last 3 Encounters:  10/05/20 22.25 kg/m  06/06/20 21.43 kg/m  10/28/19 21.46 kg/m      Advanced Care Planning:  A voluntary discussion about advance care planning including the explanation and discussion of advance directives.   Discussed health care proxy and Living will, and the patient was able to identify a health care proxy as husband Patient does not have a living will at present time.   Social History      She        Social History   Socioeconomic History  . Marital status: Married    Spouse name: Erline Levine  . Number of children: 2  . Years of education: 50  . Highest education level: Some college, no degree  Occupational History  . Not on file  Tobacco Use  . Smoking status: Current Every Day Smoker    Packs/day: 1.00    Years: 27.00    Pack years: 27.00    Types: Cigarettes  . Smokeless tobacco: Never Used  Vaping Use  . Vaping Use: Never used  Substance and Sexual Activity  . Alcohol use: No  . Drug use: No  . Sexual activity: Yes    Partners: Male    Birth control/protection: Surgical  Other Topics Concern  . Not on file  Social History Narrative  . Not on file   Social Determinants of Health   Financial Resource Strain:  Low Risk   . Difficulty of Paying Living Expenses: Not hard at all  Food Insecurity: No Food Insecurity  . Worried About Charity fundraiser in the Last Year: Never true  . Ran Out of Food in the Last Year: Never true  Transportation Needs: No Transportation Needs  . Lack of Transportation (Medical): No  . Lack of Transportation (Non-Medical): No  Physical Activity: Insufficiently Active  . Days of Exercise per Week: 3 days  . Minutes of Exercise per Session: 20 min  Stress: Not on file  Social Connections: Moderately Isolated  . Frequency of Communication with Friends and Family: Once a week  . Frequency of Social Gatherings with Friends and Family: Never  . Attends Religious Services: 1 to 4 times per year  . Active Member of Clubs or Organizations: No  . Attends Archivist Meetings: Never  . Marital Status: Married    Family History        Family History  Problem Relation Age of Onset  . Diabetes Mother   . Dementia Mother   . Heart disease Father   . Dementia Father   . Heart disease Maternal Uncle   . Heart attack Maternal Uncle   .  Heart disease Maternal Grandmother   . Heart attack Maternal Grandmother   . Lupus Other        niece  . Breast cancer Neg Hx   . Cancer Neg Hx   . COPD Neg Hx   . Stroke Neg Hx   . Bladder Cancer Neg Hx   . Kidney cancer Neg Hx   . Prostate cancer Neg Hx     Patient Active Problem List   Diagnosis Date Noted  . Routine general medical examination at a health care facility 10/13/2018  . Abnormal feces   . Polyp of sigmoid colon   . Abdominal aortic atherosclerosis ([place]) 12/17/2016  . Heme positive stool 10/17/2016  . Hypertriglyceridemia 10/11/2016  . Vitamin D deficiency 10/11/2016  . Elevated hematocrit 10/11/2016  . Right lower quadrant pain 10/05/2016  . Breast cancer screening 10/05/2016  . Preventative health care 10/05/2016  . Osteoporosis 08/09/2015  . Diverticulitis large intestine 06/09/2015  .  Dyslipidemia 06/05/2015  . Tobacco abuse 03/27/2015    Past Surgical History:  Procedure Laterality Date  . ABDOMINAL HYSTERECTOMY  2004   TAH-BSO  . COLONOSCOPY WITH PROPOFOL N/A 01/31/2017   Procedure: COLONOSCOPY WITH PROPOFOL;  Surgeon: Lucilla Lame, MD;  Location: [place];  Service: Endoscopy;  Laterality: N/A;  . [place]  2006  . INGUINAL HERNIA REPAIR Right 2008   Dr. Jamal Collin     Current Outpatient Medications:  .  meloxicam (MOBIC) 15 MG tablet, Take 1 tablet (15 mg total) by mouth daily., Disp: 30 tablet, Rfl: 0  No Known Allergies  Patient Care Team: Delsa Grana, PA-C as PCP - General (Family Medicine) Sanda Klein Satira Anis, MD as Attending Physician (Family Medicine) Bary Castilla Forest Gleason, MD (General Surgery)  Review of Systems  Constitutional: Negative.   HENT: Negative.   Eyes: Negative.   Respiratory: Negative.   Cardiovascular: Negative.   Gastrointestinal: Negative.   Endocrine: Negative.   Genitourinary: Negative.   Musculoskeletal: Negative.   Skin: Negative.   Allergic/Immunologic: Negative.   Neurological: Negative.   Hematological: Negative.   Psychiatric/Behavioral: Negative.   All other systems reviewed and are negative.    I personally reviewed active problem list, medication list, allergies, family history, social history, health maintenance, notes from last encounter, lab results, imaging with the patient/caregiver today.        Objective:   Vitals:  Vitals:   10/05/20 0905  BP: 122/72  Pulse: 96  Resp: 16  Temp: 97.6 F (36.4 C)  SpO2: 96%  Weight: 133 lb 11.2 oz (60.6 kg)  Height: 5' 5" (1.651 m)    Body mass index is 22.25 kg/m.  Physical Exam Vitals and nursing note reviewed.  Constitutional:      General: She is not in acute distress.    Appearance: Normal appearance. She is well-developed. She is not ill-appearing, toxic-appearing or diaphoretic.     Interventions: Face mask in place.  HENT:      Head: Normocephalic and atraumatic.     Right Ear: External ear normal.     Left Ear: External ear normal.  Eyes:     General: Lids are normal. No scleral icterus.       Right eye: No discharge.        Left eye: No discharge.     Conjunctiva/sclera: Conjunctivae normal.      Comments: Lesion/nodule to left inner lower eye lid - roughly 8 mm in diameter, raised  Neck:     Trachea: Phonation  normal. No tracheal deviation.  Cardiovascular:     Rate and Rhythm: Normal rate and regular rhythm.     Pulses: Normal pulses.          Radial pulses are 2+ on the right side and 2+ on the left side.     Heart sounds: Normal heart sounds. No murmur heard. No friction rub. No gallop.   Pulmonary:     Effort: Pulmonary effort is normal. No respiratory distress.     Breath sounds: Normal breath sounds. No stridor. No wheezing, rhonchi or rales.  Chest:     Chest wall: No tenderness.  Abdominal:     General: Bowel sounds are normal. There is no distension.     Palpations: Abdomen is soft.  Musculoskeletal:        General: Deformity (scoliosis) present.     Right lower leg: No edema.     Left lower leg: No edema.  Skin:    General: Skin is warm and dry.     Coloration: Skin is not jaundiced or pale.     Findings: Lesion present. No rash. Injury: to back.  Neurological:     Mental Status: She is alert.     Motor: No abnormal muscle tone.     Gait: Gait normal.  Psychiatric:        Mood and Affect: Mood normal.        Speech: Speech normal.        Behavior: Behavior normal.       Fall Risk: Fall Risk  10/05/2020 06/06/2020 01/14/2019 10/13/2018 10/07/2017  Falls in the past year? 0 0 0 0 No  Number falls in past yr: 0 0 0 0 -  Injury with Fall? 0 0 0 0 -  Follow up - Falls evaluation completed - - -    Functional Status Survey: Is the patient deaf or have difficulty hearing?: No Does the patient have difficulty seeing, even when wearing glasses/contacts?: No Does the patient have  difficulty concentrating, remembering, or making decisions?: No Does the patient have difficulty walking or climbing stairs?: No Does the patient have difficulty dressing or bathing?: No Does the patient have difficulty doing errands alone such as visiting a doctor's office or shopping?: No   Assessment & Plan:    CPE completed today  . USPSTF grade A and B recommendations reviewed with patient; age-appropriate recommendations, preventive care, screening tests, etc discussed and encouraged; healthy living encouraged; see AVS for patient education given to patient  . Discussed importance of 150 minutes of physical activity weekly, AHA exercise recommendations given to pt in AVS/handout  . Discussed importance of healthy diet:  eating lean meats and proteins, avoiding trans fats and saturated fats, avoid simple sugars and excessive carbs in diet, eat 6 servings of fruit/vegetables daily and drink plenty of water and avoid sweet beverages.    . Recommended pt to do annual eye exam and routine dental exams/cleanings  . Depression, alcohol, fall screening completed as documented above and per flowsheets  . Reviewed Health Maintenance: Health Maintenance  Topic Date Due  . Hepatitis C Screening  Never done  . COVID-19 Vaccine (3 - Booster for Moderna series) 08/24/2020  . INFLUENZA VACCINE  12/08/2020 (Originally 04/10/2020)  . MAMMOGRAM  08/17/2021  . TETANUS/TDAP  12/09/2021  . COLONOSCOPY (Pts 45-46yr Insurance coverage will need to be confirmed)  01/31/2022  . HIV Screening  Completed  . PAP SMEAR-Modifier  Discontinued    . Immunizations: Immunization History  Administered Date(s) Administered  . Moderna Sars-Covid-2 Vaccination 01/23/2020, 02/23/2020  . Td 01/22/2006  . Tdap 12/10/2011      Lesion to left eye- 2 years- nodule ophtholmology   lesions - skin, fair - derm referral  Chronic neck/back pain- discussed PT or spine specialists referral and discussed other  management options - muscle relaxers, lyrica/gabapentin     ICD-10-CM   1. Adult general medical exam  Z00.00 CBC with Differential/Platelet    Lipid panel    COMPLETE METABOLIC PANEL WITH GFR    VITAMIN D 25 Hydroxy (Vit-D Deficiency, Fractures)  2. Encounter for hepatitis C screening test for low risk patient  Z11.59 Hepatitis C antibody  3. Dyslipidemia  E78.5 Lipid panel    COMPLETE METABOLIC PANEL WITH GFR   still no interest in treating - encouraged her to try statin 3x a week or every other day to see if she tolerates  4. Abdominal aortic atherosclerosis (HCC)  I70.0 Lipid panel  5. Tobacco abuse  Z72.0 Ambulatory Referral for Lung Cancer Scre   discussed smoking cessation meds options and local resources  6. Vitamin D deficiency  U88.2 COMPLETE METABOLIC PANEL WITH GFR    VITAMIN D 25 Hydroxy (Vit-D Deficiency, Fractures)   increase Vit D supplement - advanced osteoporosis for age and low vit D   7. Osteoporosis without current pathological fracture, unspecified osteoporosis type  C00.3 COMPLETE METABOLIC PANEL WITH GFR    VITAMIN D 25 Hydroxy (Vit-D Deficiency, Fractures)    DG Bone Density   due for f/up - pt likely not to due because of cost - discussed management of osteoporosis and risk of fx, avoid falls  8. Smoking greater than 20 pack years  F17.210 Ambulatory Referral for Lung Cancer Scre  9. Lesion of left lower eyelid  H02.9 Ambulatory referral to Ophthalmology   refer to ophtho for eval and tx, onset 2 years ago  23. Skin lesion of back  L98.9 Ambulatory referral to Dermatology   multiple different kinds of lesions to back - refer to derm for skin survey      Smoking cessation instruction/counseling given:  counseled patient on the dangers of tobacco use, advised patient to stop smoking, and reviewed strategies to maximize success  Spent more than 5 minutes discussing today     Delsa Grana, PA-C 10/05/20 9:25 AM  [place]

## 2020-10-05 ENCOUNTER — Ambulatory Visit (INDEPENDENT_AMBULATORY_CARE_PROVIDER_SITE_OTHER): Payer: 59 | Admitting: Family Medicine

## 2020-10-05 ENCOUNTER — Encounter: Payer: Self-pay | Admitting: Family Medicine

## 2020-10-05 ENCOUNTER — Other Ambulatory Visit: Payer: Self-pay

## 2020-10-05 VITALS — BP 122/72 | HR 96 | Temp 97.6°F | Resp 16 | Ht 65.0 in | Wt 133.7 lb

## 2020-10-05 DIAGNOSIS — E785 Hyperlipidemia, unspecified: Secondary | ICD-10-CM | POA: Diagnosis not present

## 2020-10-05 DIAGNOSIS — Z720 Tobacco use: Secondary | ICD-10-CM | POA: Diagnosis not present

## 2020-10-05 DIAGNOSIS — L989 Disorder of the skin and subcutaneous tissue, unspecified: Secondary | ICD-10-CM

## 2020-10-05 DIAGNOSIS — Z0000 Encounter for general adult medical examination without abnormal findings: Secondary | ICD-10-CM

## 2020-10-05 DIAGNOSIS — Z1159 Encounter for screening for other viral diseases: Secondary | ICD-10-CM | POA: Diagnosis not present

## 2020-10-05 DIAGNOSIS — F17210 Nicotine dependence, cigarettes, uncomplicated: Secondary | ICD-10-CM | POA: Diagnosis not present

## 2020-10-05 DIAGNOSIS — I700 Atherosclerosis of aorta: Secondary | ICD-10-CM

## 2020-10-05 DIAGNOSIS — E559 Vitamin D deficiency, unspecified: Secondary | ICD-10-CM

## 2020-10-05 DIAGNOSIS — M810 Age-related osteoporosis without current pathological fracture: Secondary | ICD-10-CM

## 2020-10-05 DIAGNOSIS — H029 Unspecified disorder of eyelid: Secondary | ICD-10-CM

## 2020-10-05 MED ORDER — TIZANIDINE HCL 4 MG PO TABS: 2.0000 mg | ORAL_TABLET | Freq: Three times a day (TID) | ORAL | 2 refills | Status: DC | PRN

## 2020-10-19 ENCOUNTER — Encounter: Payer: Self-pay | Admitting: *Deleted

## 2020-10-19 ENCOUNTER — Telehealth: Payer: Self-pay | Admitting: *Deleted

## 2020-10-19 DIAGNOSIS — Z122 Encounter for screening for malignant neoplasm of respiratory organs: Secondary | ICD-10-CM

## 2020-10-19 DIAGNOSIS — Z87891 Personal history of nicotine dependence: Secondary | ICD-10-CM

## 2020-10-19 DIAGNOSIS — F17200 Nicotine dependence, unspecified, uncomplicated: Secondary | ICD-10-CM

## 2020-10-19 NOTE — Telephone Encounter (Signed)
Received referral for initial lung cancer screening scan. Contacted patient and obtained smoking history,(current, 40 pack year) as well as answering questions related to screening process. Patient denies signs of lung cancer such as weight loss or hemoptysis. Patient denies comorbidity that would prevent curative treatment if lung cancer were found. Patient is scheduled for shared decision making visit and CT scan on 10/26/20 at 1030am.

## 2020-10-26 ENCOUNTER — Inpatient Hospital Stay: Payer: 59 | Admitting: Hospice and Palliative Medicine

## 2020-10-26 ENCOUNTER — Ambulatory Visit: Payer: 59

## 2020-11-18 ENCOUNTER — Encounter: Payer: Self-pay | Admitting: *Deleted

## 2020-12-05 ENCOUNTER — Ambulatory Visit: Payer: 59 | Admitting: Family Medicine

## 2020-12-22 ENCOUNTER — Telehealth: Payer: Self-pay | Admitting: *Deleted

## 2020-12-22 NOTE — Telephone Encounter (Signed)
.   Message left at phone number listed in EMR for patient to call me back to facilitate rescheduling the (previously canceled by patient 10/26/20) scan.

## 2021-01-04 ENCOUNTER — Telehealth: Payer: Self-pay | Admitting: *Deleted

## 2021-01-04 NOTE — Telephone Encounter (Signed)
Left voicemail message for patient to call me back re: rescheduling the previously canceled initial lung screening scan and SDMV.

## 2021-01-06 ENCOUNTER — Encounter: Payer: Self-pay | Admitting: *Deleted

## 2021-02-16 ENCOUNTER — Ambulatory Visit
Admission: RE | Admit: 2021-02-16 | Discharge: 2021-02-16 | Disposition: A | Discharge status: Home / Self Care | Payer: 59 | Source: Ambulatory Visit | Attending: Family Medicine | Admitting: Family Medicine

## 2021-02-16 ENCOUNTER — Other Ambulatory Visit: Payer: Self-pay

## 2021-02-16 DIAGNOSIS — R928 Other abnormal and inconclusive findings on diagnostic imaging of breast: Secondary | ICD-10-CM | POA: Insufficient documentation

## 2021-02-16 DIAGNOSIS — N6489 Other specified disorders of breast: Secondary | ICD-10-CM

## 2021-02-16 IMAGING — MG MM DIGITAL DIAGNOSTIC UNILAT*R* W/ TOMO W/ CAD
4 series · 4 of 12 positions shown · non-contrast
Comparison: Previous exam(s).

CLINICAL DATA: BI-RADS 3 follow-up of a RIGHT breast asymmetry seen
on CC view only.

EXAM:
DIGITAL DIAGNOSTIC UNILATERAL RIGHT MAMMOGRAM WITH TOMOSYNTHESIS AND
CAD
TECHNIQUE: Right digital diagnostic mammography and breast tomosynthesis was
performed. The images were evaluated with computer-aided detection.

[R MLO synth-2D]
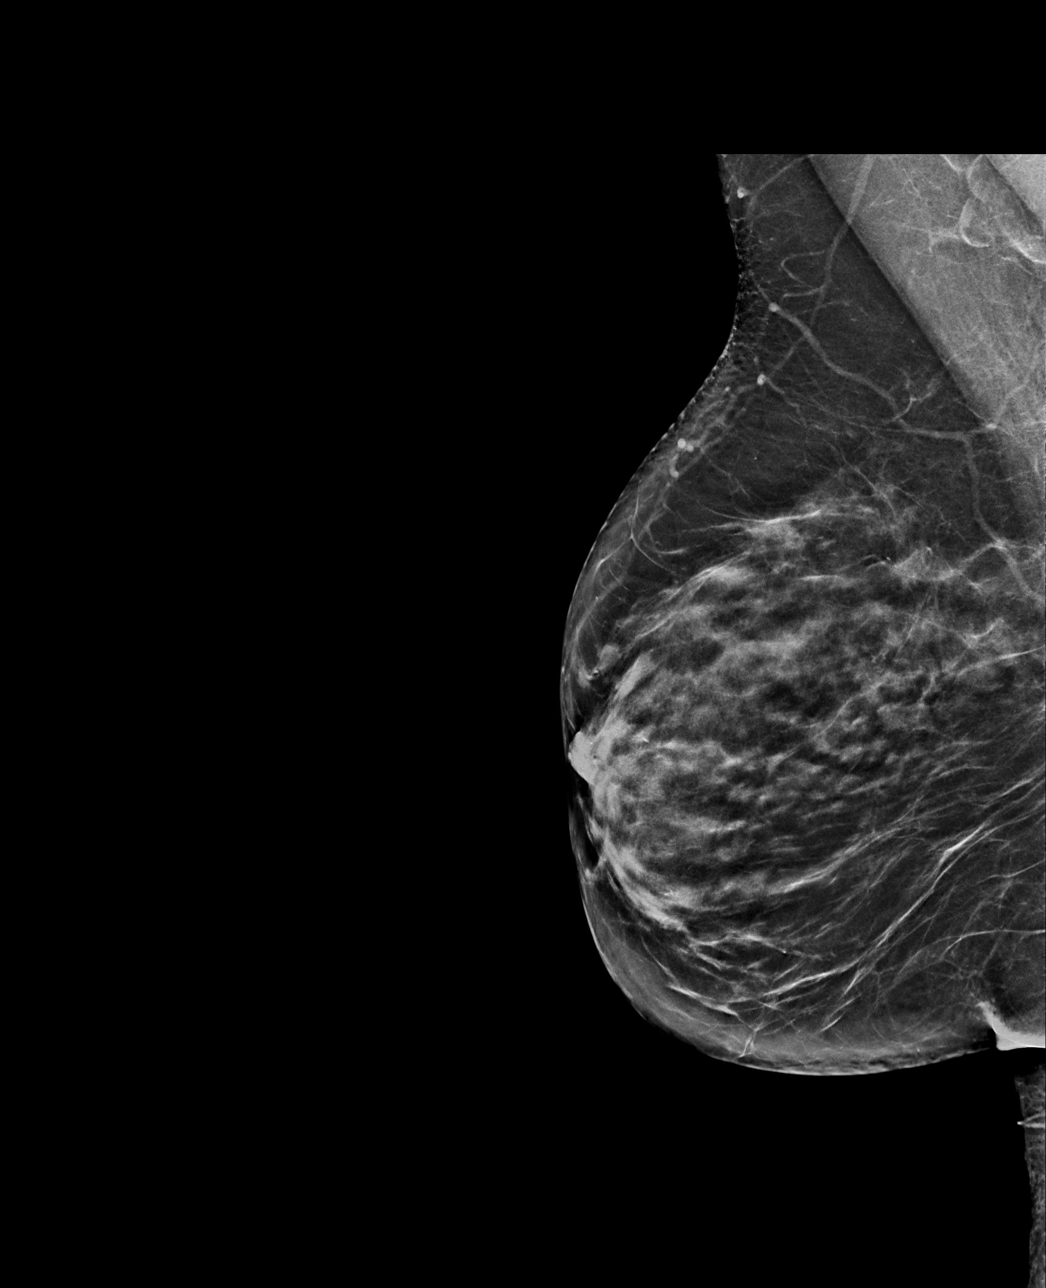

[R CC synth-2D]
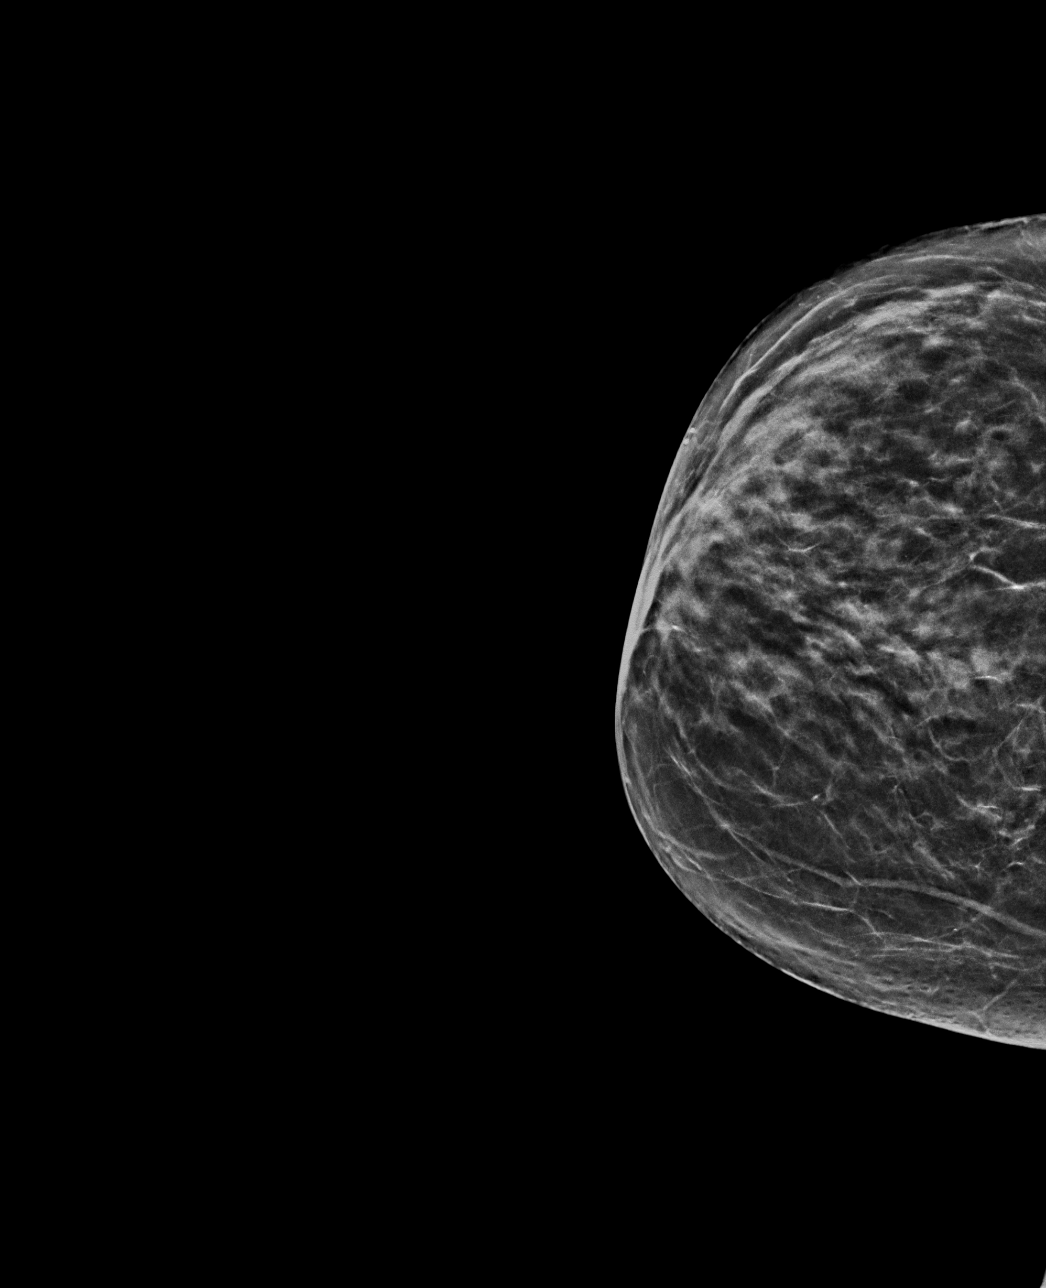

[R MLO tomo · tomo slice 34/67.0]
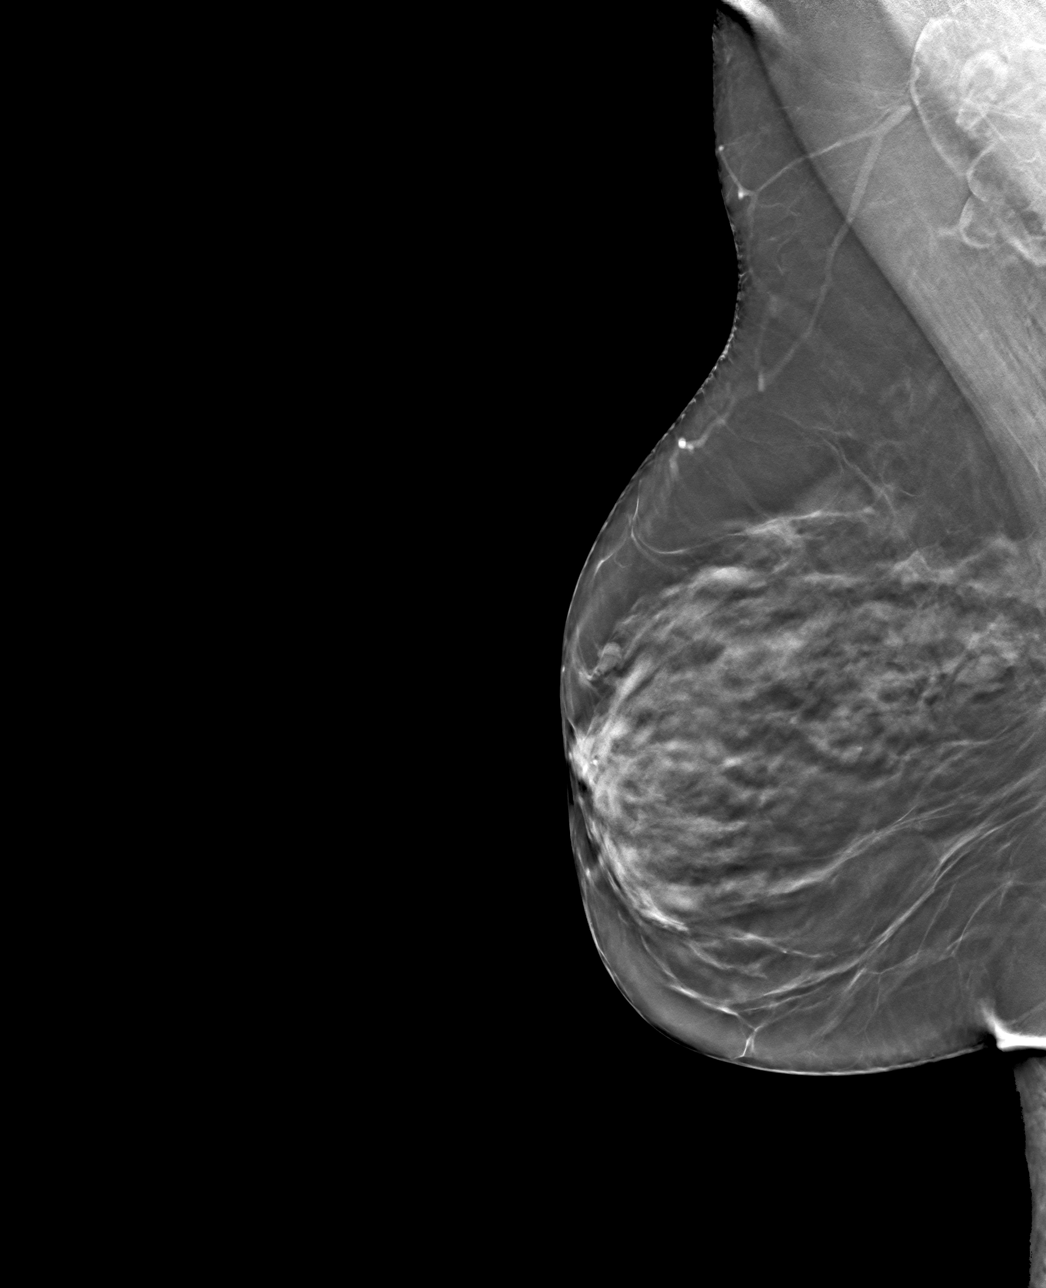

[R CC tomo · tomo slice 29/57.0]
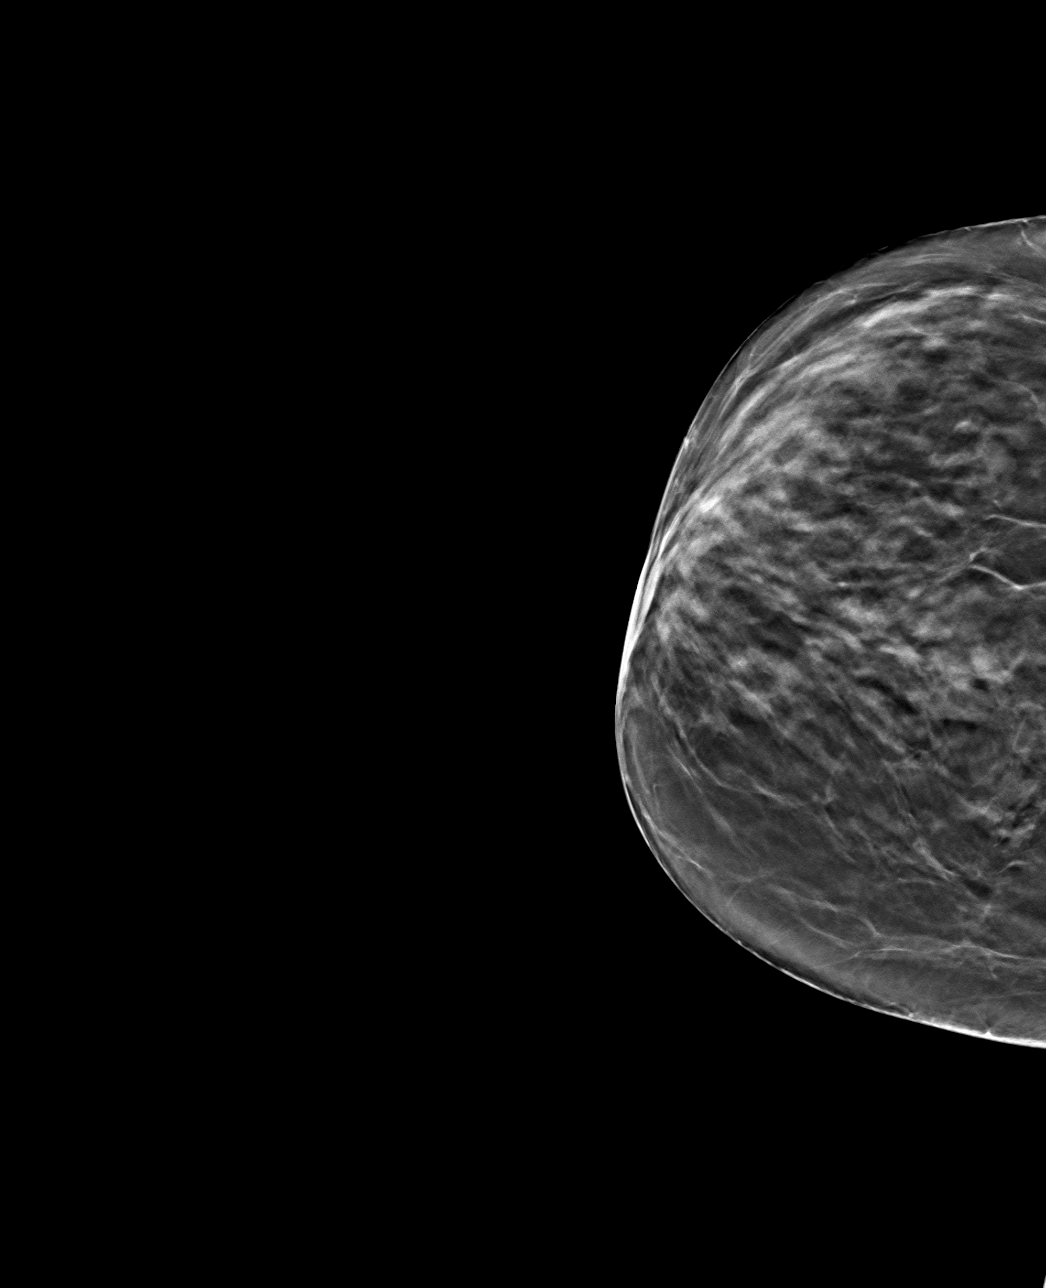

[4 of 12 positions shown; findings below may reference images not displayed]

ACR Breast Density Category c: The breast tissue is heterogeneously
dense, which may obscure small masses.
FINDINGS: Diagnostic images of the RIGHT breast demonstrate stable
mammographic appearance of the fibroglandular tissue in the RIGHT
upper inner breast at posterior depth. No new suspicious findings
within the RIGHT breast.
IMPRESSION: Stable probably benign RIGHT breast asymmetry seen on CC view only
which likely reflects patient's baseline configuration of
fibroglandular tissue. Recommend follow-up diagnostic mammogram in 6
months. This will establish 1 year of definitive stability and close
follow-up.

RECOMMENDATION:
Bilateral diagnostic mammogram with RIGHT breast ultrasound if
deemed necessary in 6 months. Patient is due for contralateral
screening at this point in time.

I have discussed the findings and recommendations with the patient.
If applicable, a reminder letter will be sent to the patient
regarding the next appointment.

BI-RADS CATEGORY  3: Probably benign.

## 2021-03-29 ENCOUNTER — Ambulatory Visit: Payer: 59 | Admitting: Dermatology

## 2021-05-08 ENCOUNTER — Telehealth: Payer: Self-pay

## 2021-05-08 NOTE — Telephone Encounter (Signed)
Called to remind patient to schedule bone density scan, Patient declined due to insurance not covering. She stated they covered her first one but would not cover any further without suspicions.

## 2021-05-09 ENCOUNTER — Other Ambulatory Visit: Payer: Self-pay

## 2021-05-17 ENCOUNTER — Telehealth: Payer: Self-pay

## 2021-05-17 NOTE — Telephone Encounter (Signed)
We received a release of information form from [place]  to fax all records from [facility]. Fax all records and got confirmation

## 2021-09-22 ENCOUNTER — Telehealth: Payer: Self-pay

## 2021-09-22 DIAGNOSIS — Z1231 Encounter for screening mammogram for malignant neoplasm of breast: Secondary | ICD-10-CM

## 2021-09-22 DIAGNOSIS — R928 Other abnormal and inconclusive findings on diagnostic imaging of breast: Secondary | ICD-10-CM

## 2021-09-22 DIAGNOSIS — N6489 Other specified disorders of breast: Secondary | ICD-10-CM

## 2021-09-22 NOTE — Telephone Encounter (Signed)
Orders have been placed pt was notified through detailed vm.

## 2021-09-22 NOTE — Telephone Encounter (Signed)
Copied from [place] [id number]. Topic: General - Other >> Sep 22, 2021  9:18 AM Tessa Lerner A wrote: Reason for CRM: The patient has been directed to contact their PCP to request order for a 6 month follow up diagnostic mammogram and ultrasound   The patient would like the orders sent to [facility]   The patient would like to have their appointment on 10/09/21  Please contact further when possible

## 2021-10-09 ENCOUNTER — Encounter: Payer: 59 | Admitting: Family Medicine

## 2021-10-09 ENCOUNTER — Encounter: Payer: Self-pay | Admitting: Nurse Practitioner

## 2021-10-09 ENCOUNTER — Other Ambulatory Visit: Payer: Self-pay

## 2021-10-09 ENCOUNTER — Ambulatory Visit (INDEPENDENT_AMBULATORY_CARE_PROVIDER_SITE_OTHER): Payer: 59 | Admitting: Nurse Practitioner

## 2021-10-09 VITALS — BP 118/76 | HR 88 | Temp 97.9°F | Resp 14 | Ht 65.0 in | Wt 138.9 lb

## 2021-10-09 DIAGNOSIS — Z131 Encounter for screening for diabetes mellitus: Secondary | ICD-10-CM

## 2021-10-09 DIAGNOSIS — Z0000 Encounter for general adult medical examination without abnormal findings: Secondary | ICD-10-CM

## 2021-10-09 DIAGNOSIS — E785 Hyperlipidemia, unspecified: Secondary | ICD-10-CM

## 2021-10-09 DIAGNOSIS — E559 Vitamin D deficiency, unspecified: Secondary | ICD-10-CM | POA: Diagnosis not present

## 2021-10-09 DIAGNOSIS — Z1211 Encounter for screening for malignant neoplasm of colon: Secondary | ICD-10-CM | POA: Diagnosis not present

## 2021-10-09 DIAGNOSIS — Z130 Encounter for screening for diseases of the blood and blood-forming organs and certain disorders involving the immune mechanism: Secondary | ICD-10-CM

## 2021-10-09 NOTE — Progress Notes (Signed)
Name: Kathryn Fuller   MRN: [id number]    DOB: 30-Aug-1971   Date:10/09/2021       Progress Note  Subjective  Chief Complaint  Chief Complaint  Patient presents with   Annual Exam    HPI  Patient presents for annual CPE.  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  Discussed recommendations of 150 min of physical activity a week.  Nicotine dependence:  She is a daily smoker, does not wish to quit.   Social connections:  She lives with her husband and works several days a week.    [place] Office Visit from 10/09/2021 in [facility]  AUDIT-C Score 0      Depression: Phq 9 is  negative Depression screen [facility] 2/9 10/09/2021 10/05/2020 06/06/2020 01/14/2019 10/13/2018  Decreased Interest 0 0 0 0 0  Down, Depressed, Hopeless 0 0 0 0 0  PHQ - 2 Score 0 0 0 0 0  Altered sleeping 0 0 - 0 0  Tired, decreased energy 0 0 - 0 0  Change in appetite 0 0 - 0 0  Feeling bad or failure about yourself  0 0 - 0 0  Trouble concentrating 0 0 - 0 0  Moving slowly or fidgety/restless 0 0 - 0 0  Suicidal thoughts 0 0 - 0 0  PHQ-9 Score 0 0 - 0 0  Difficult doing work/chores Not difficult at all Not difficult at all - Not difficult at all Not difficult at all   Hypertension: BP Readings from Last 3 Encounters:  10/09/21 118/76  10/05/20 122/72  06/06/20 120/68   Obesity: Wt Readings from Last 3 Encounters:  10/09/21 138 lb 14.4 oz (63 kg)  10/05/20 133 lb 11.2 oz (60.6 kg)  06/06/20 128 lb 12.8 oz (58.4 kg)   BMI Readings from Last 3 Encounters:  10/09/21 23.11 kg/m  10/05/20 22.25 kg/m  06/06/20 21.43 kg/m     Vaccines:  HPV: up to at age 55 , ask insurance if age between 67-45  Shingrix: 16-64 yo and ask insurance if covered when patient above 75 yo Pneumonia:  educated and discussed with patient. Flu:  educated and discussed with patient.  Hep C Screening: declines STD testing and prevention  (HIV/chl/gon/syphilis): 03/29/2015 Intimate partner violence:none Sexual History : married Menstrual History/LMP/Abnormal Bleeding: hysterectomy Incontinence Symptoms: urge and stress incontinence sometimes  Breast cancer:  - Last Mammogram: 08/17/2020, scheduled for one on 10/12/2021 - BRCA gene screening: none  Osteoporosis: Discussed high calcium and vitamin D supplementation, weight bearing exercises  Cervical cancer screening: hysterectomy  Skin cancer: Discussed monitoring for atypical lesions  Colorectal cancer: 01/31/2017 Lung cancer:   Low Dose CT Chest recommended if Age 38-80 years, 27 pack-year currently smoking OR have quit w/in 15years. Patient does qualify.  Thinking about it.  ECG: 10/28/2019  Advanced Care Planning: A voluntary discussion about advance care planning including the explanation and discussion of advance directives.  Discussed health care proxy and Living will, and the patient was able to identify a health care proxy as husband.  Patient does not have a living will at present time. If patient does have living will, I have requested they bring this to the clinic to be scanned in to their chart.  Lipids: Lab Results  Component Value Date   CHOL 219 (H) 06/06/2020   CHOL 243 (H) 10/13/2018   CHOL 215 (H) 10/07/2017   Lab Results  Component Value  Date   HDL 38 (L) 06/06/2020   HDL 45 (L) 10/13/2018   HDL 39 (L) 10/07/2017   Lab Results  Component Value Date   LDLCALC 146 (H) 06/06/2020   LDLCALC 169 (H) 10/13/2018   LDLCALC 150 (H) 10/07/2017   Lab Results  Component Value Date   TRIG 210 (H) 06/06/2020   TRIG 144 10/13/2018   TRIG 134 10/07/2017   Lab Results  Component Value Date   CHOLHDL 5.8 (H) 06/06/2020   CHOLHDL 5.4 (H) 10/13/2018   CHOLHDL 5.5 (H) 10/07/2017   No results found for: LDLDIRECT  Glucose: Glucose  Date Value Ref Range Status  06/06/2012 79 65 - 99 mg/dL Final   Glucose, Bld  Date Value Ref Range Status  06/06/2020  79 65 - 99 mg/dL Final    Comment:    .            Fasting reference interval .   10/13/2018 70 65 - 99 mg/dL Final    Comment:    .            Fasting reference interval .   10/07/2017 78 65 - 139 mg/dL Final    Comment:    .        Non-fasting reference interval .     Patient Active Problem List   Diagnosis Date Noted   Herniated cervical disc 10/23/2019   Routine general medical examination at a health care facility 10/13/2018   Abnormal feces    Polyp of sigmoid colon    Abdominal aortic atherosclerosis ([place]) 12/17/2016   Heme positive stool 10/17/2016   Hypertriglyceridemia 10/11/2016   Vitamin D deficiency 10/11/2016   Elevated hematocrit 10/11/2016   Right lower quadrant pain 10/05/2016   Breast cancer screening 10/05/2016   Preventative health care 10/05/2016   Osteoporosis 08/09/2015   Diverticulitis large intestine 06/09/2015   Dyslipidemia 06/05/2015   Tobacco abuse 03/27/2015    Past Surgical History:  Procedure Laterality Date   ABDOMINAL HYSTERECTOMY  2004   TAH-BSO   COLONOSCOPY WITH PROPOFOL N/A 01/31/2017   Procedure: COLONOSCOPY WITH PROPOFOL;  Surgeon: Lucilla Lame, MD;  Location: [place];  Service: Endoscopy;  Laterality: N/A;   INCISIONAL HERNIA REPAIR  2006   INGUINAL HERNIA REPAIR Right 2008   Dr. Jamal Collin    Family History  Problem Relation Age of Onset   Diabetes Mother    Dementia Mother    Heart disease Father    Dementia Father    Heart disease Maternal Uncle    Heart attack Maternal Uncle    Heart disease Maternal Grandmother    Heart attack Maternal Grandmother    Lupus Other        niece   Breast cancer Neg Hx    Cancer Neg Hx    COPD Neg Hx    Stroke Neg Hx    Bladder Cancer Neg Hx    Kidney cancer Neg Hx    Prostate cancer Neg Hx     Social History   Socioeconomic History   Marital status: Married    Spouse name: Erline Levine   Number of children: 2   Years of education: 12   Highest education level:  Some college, no degree  Occupational History   Not on file  Tobacco Use   Smoking status: Every Day    Packs/day: 1.25    Years: 32.00    Pack years: 40.00    Types: Cigarettes   Smokeless tobacco: Never  Vaping Use   Vaping Use: Never used  Substance and Sexual Activity   Alcohol use: No   Drug use: No   Sexual activity: Yes    Partners: Male    Birth control/protection: Surgical  Other Topics Concern   Not on file  Social History Narrative   Not on file   Social Determinants of Health   Financial Resource Strain: Low Risk    Difficulty of Paying Living Expenses: Not hard at all  Food Insecurity: No Food Insecurity   Worried About Charity fundraiser in the Last Year: Never true   [place] in the Last Year: Never true  Transportation Needs: No Transportation Needs   Lack of Transportation (Medical): No   Lack of Transportation (Non-Medical): No  Physical Activity: Inactive   Days of Exercise per Week: 0 days   Minutes of Exercise per Session: 0 min  Stress: No Stress Concern Present   Feeling of Stress : Not at all  Social Connections: Moderately Isolated   Frequency of Communication with Friends and Family: More than three times a week   Frequency of Social Gatherings with Friends and Family: More than three times a week   Attends Religious Services: Never   Marine scientist or Organizations: No   Attends Music therapist: Never   Marital Status: Married  Human resources officer Violence: Not At Risk   Fear of Current or Ex-Partner: No   Emotionally Abused: No   Physically Abused: No   Sexually Abused: No     Current Outpatient Medications:    gabapentin (NEURONTIN) 300 MG capsule, Take 1 capsule by mouth 3 (three) times daily. (Patient not taking: Reported on 10/09/2021), Disp: , Rfl:    tiZANidine (ZANAFLEX) 4 MG tablet, Take 0.5-1.5 tablets (2-6 mg total) by mouth every 8 (eight) hours as needed for muscle spasms (muscle tightness).  (Patient not taking: Reported on 10/09/2021), Disp: 90 tablet, Rfl: 2  No Known Allergies   ROS  Constitutional: Negative for fever or weight change.  Respiratory: Negative for cough and shortness of breath.   Cardiovascular: Negative for chest pain or palpitations.  Gastrointestinal: Negative for abdominal pain, no bowel changes.  Musculoskeletal: Negative for gait problem or joint swelling.  Skin: Negative for rash.  Neurological: Negative for dizziness or headache.  No other specific complaints in a complete review of systems (except as listed in HPI above).   Objective  Vitals:   10/09/21 1400  BP: 118/76  Pulse: 88  Resp: 14  Temp: 97.9 F (36.6 C)  TempSrc: Oral  SpO2: 96%  Weight: 138 lb 14.4 oz (63 kg)  Height: 5' 5" (1.651 m)    Body mass index is 23.11 kg/m.  Physical Exam Constitutional: Patient appears well-developed and well-nourished. No distress.  HENT: Head: Normocephalic and atraumatic. Ears: B TMs ok, no erythema or effusion; Nose: Nose normal. Mouth/Throat: not done Eyes: Conjunctivae and EOM are normal. Pupils are equal, round, and reactive to light. No scleral icterus.  Neck: Normal range of motion. Neck supple. No JVD present. No thyromegaly present.  Cardiovascular: Normal rate, regular rhythm and normal heart sounds.  No murmur heard. No BLE edema. Pulmonary/Chest: Effort normal and breath sounds normal. No respiratory distress. Abdominal: Soft. Bowel sounds are normal, no distension. There is no tenderness. no masses Breast: declined, mammogram scheduled for 10/12/2021. FEMALE GENITALIA: not done RECTAL: not done Musculoskeletal: Normal range of motion, no joint effusions. No gross deformities Neurological: he is  alert and oriented to person, place, and time. No cranial nerve deficit. Coordination, balance, strength, speech and gait are normal.  Skin: Skin is warm and dry. No rash noted. No erythema.  Psychiatric: Patient has a normal mood and  affect. behavior is normal. Judgment and thought content normal.     Fall Risk: Fall Risk  10/09/2021 10/05/2020 06/06/2020 01/14/2019 10/13/2018  Falls in the past year? 0 0 0 0 0  Number falls in past yr: 0 0 0 0 0  Injury with Fall? 0 0 0 0 0  Follow up Falls evaluation completed - Falls evaluation completed - -     Functional Status Survey: Is the patient deaf or have difficulty hearing?: No Does the patient have difficulty seeing, even when wearing glasses/contacts?: No Does the patient have difficulty concentrating, remembering, or making decisions?: No Does the patient have difficulty walking or climbing stairs?: No Does the patient have difficulty dressing or bathing?: No Does the patient have difficulty doing errands alone such as visiting a doctor's office or shopping?: No   Assessment & Plan  1. Adult general medical exam  - Lipid panel - CBC with Differential/Platelet - COMPLETE METABOLIC PANEL WITH GFR - Hemoglobin A1c - VITAMIN D 25 Hydroxy (Vit-D Deficiency, Fractures)  2. Screening for malignant neoplasm of colon  - Ambulatory referral to Gastroenterology  3. Dyslipidemia  - Lipid panel - COMPLETE METABOLIC PANEL WITH GFR  4. Vitamin D deficiency  - VITAMIN D 25 Hydroxy (Vit-D Deficiency, Fractures)  5. Screening for deficiency anemia  - CBC with Differential/Platelet  6. Screening for diabetes mellitus  - Hemoglobin A1c   -USPSTF grade A and B recommendations reviewed with patient; age-appropriate recommendations, preventive care, screening tests, etc discussed and encouraged; healthy living encouraged; see AVS for patient education given to patient -Discussed importance of 150 minutes of physical activity weekly, eat two servings of fish weekly, eat one serving of tree nuts ( cashews, pistachios, pecans, almonds.Marland Kitchen) every other day, eat 6 servings of fruit/vegetables daily and drink plenty of water and avoid sweet beverages.

## 2021-10-10 ENCOUNTER — Telehealth: Payer: Self-pay

## 2021-10-10 LAB — CBC WITH DIFFERENTIAL/PLATELET
Absolute Monocytes: 783 cells/uL (ref 200–950)
Basophils Absolute: 82 cells/uL (ref 0–200)
Basophils Relative: 0.8 %
Eosinophils Absolute: 165 cells/uL (ref 15–500)
Eosinophils Relative: 1.6 %
HCT: 46.5 % — ABNORMAL HIGH (ref 35.0–45.0)
Hemoglobin: 15.6 g/dL — ABNORMAL HIGH (ref 11.7–15.5)
Lymphs Abs: 2544 cells/uL (ref 850–3900)
MCH: 30.7 pg (ref 27.0–33.0)
MCHC: 33.5 g/dL (ref 32.0–36.0)
MCV: 91.5 fL (ref 80.0–100.0)
MPV: 10 fL (ref 7.5–12.5)
Monocytes Relative: 7.6 %
Neutro Abs: 6726 cells/uL (ref 1500–7800)
Neutrophils Relative %: 65.3 %
Platelets: 364 10*3/uL (ref 140–400)
RBC: 5.08 10*6/uL (ref 3.80–5.10)
RDW: 12.5 % (ref 11.0–15.0)
Total Lymphocyte: 24.7 %
WBC: 10.3 10*3/uL (ref 3.8–10.8)

## 2021-10-10 LAB — COMPLETE METABOLIC PANEL WITH GFR
AG Ratio: 1.9 (calc) (ref 1.0–2.5)
ALT: 27 U/L (ref 6–29)
AST: 19 U/L (ref 10–35)
Albumin: 4.6 g/dL (ref 3.6–5.1)
Alkaline phosphatase (APISO): 111 U/L (ref 37–153)
BUN/Creatinine Ratio: 4 (calc) — ABNORMAL LOW (ref 6–22)
BUN: 3 mg/dL — ABNORMAL LOW (ref 7–25)
CO2: 32 mmol/L (ref 20–32)
Calcium: 9.9 mg/dL (ref 8.6–10.4)
Chloride: 102 mmol/L (ref 98–110)
Creat: 0.69 mg/dL (ref 0.50–1.03)
Globulin: 2.4 g/dL (calc) (ref 1.9–3.7)
Glucose, Bld: 83 mg/dL (ref 65–99)
Potassium: 4.9 mmol/L (ref 3.5–5.3)
Sodium: 139 mmol/L (ref 135–146)
Total Bilirubin: 0.3 mg/dL (ref 0.2–1.2)
Total Protein: 7 g/dL (ref 6.1–8.1)
eGFR: 105 mL/min/{1.73_m2} (ref >=60)

## 2021-10-10 LAB — VITAMIN D 25 HYDROXY (VIT D DEFICIENCY, FRACTURES): Vit D, 25-Hydroxy: 19 ng/mL — ABNORMAL LOW (ref 30–100)

## 2021-10-10 LAB — HEMOGLOBIN A1C
Hgb A1c MFr Bld: 5.3 % of total Hgb (ref <5.7)
Mean Plasma Glucose: 105 mg/dL
eAG (mmol/L): 5.8 mmol/L

## 2021-10-10 LAB — LIPID PANEL
Cholesterol: 234 mg/dL — ABNORMAL HIGH (ref <200)
HDL: 31 mg/dL — ABNORMAL LOW (ref >=50)
Non-HDL Cholesterol (Calc): 203 mg/dL (calc) — ABNORMAL HIGH (ref <130)
Total CHOL/HDL Ratio: 7.5 (calc) — ABNORMAL HIGH (ref <5.0)
Triglycerides: 407 mg/dL — ABNORMAL HIGH (ref <150)

## 2021-10-10 NOTE — Telephone Encounter (Signed)
CALLED PATIENT NO ANSWER LEFT VOICEMAIL FOR A CALL BACK ? ?

## 2021-10-11 ENCOUNTER — Telehealth: Payer: Self-pay

## 2021-10-11 NOTE — Telephone Encounter (Signed)
CALLED PATIENT NO ANSWER LEFT VOICEMAIL FOR A CALL BACK  letter sent 

## 2021-10-12 ENCOUNTER — Ambulatory Visit
Admission: RE | Admit: 2021-10-12 | Discharge: 2021-10-12 | Disposition: A | Discharge status: Home / Self Care | Payer: 59 | Source: Ambulatory Visit | Attending: Physician Assistant | Admitting: Physician Assistant

## 2021-10-12 ENCOUNTER — Other Ambulatory Visit: Payer: Self-pay

## 2021-10-12 DIAGNOSIS — N6489 Other specified disorders of breast: Secondary | ICD-10-CM

## 2021-10-12 DIAGNOSIS — Z1231 Encounter for screening mammogram for malignant neoplasm of breast: Secondary | ICD-10-CM | POA: Diagnosis present

## 2021-10-12 DIAGNOSIS — R928 Other abnormal and inconclusive findings on diagnostic imaging of breast: Secondary | ICD-10-CM | POA: Diagnosis present

## 2021-10-12 IMAGING — MG DIGITAL DIAGNOSTIC BILAT W/ TOMO W/ CAD
8 series · 9 of 24 positions shown · non-contrast
Comparison: Previous exam(s).

CLINICAL DATA: BI-RADS 3 follow-up of a RIGHT breast asymmetry seen
on one view only.

EXAM:
DIGITAL DIAGNOSTIC BILATERAL MAMMOGRAM WITH TOMOSYNTHESIS AND CAD
TECHNIQUE: Bilateral digital diagnostic mammography and breast tomosynthesis
was performed. The images were evaluated with computer-aided
detection.

[L MLO synth-2D]
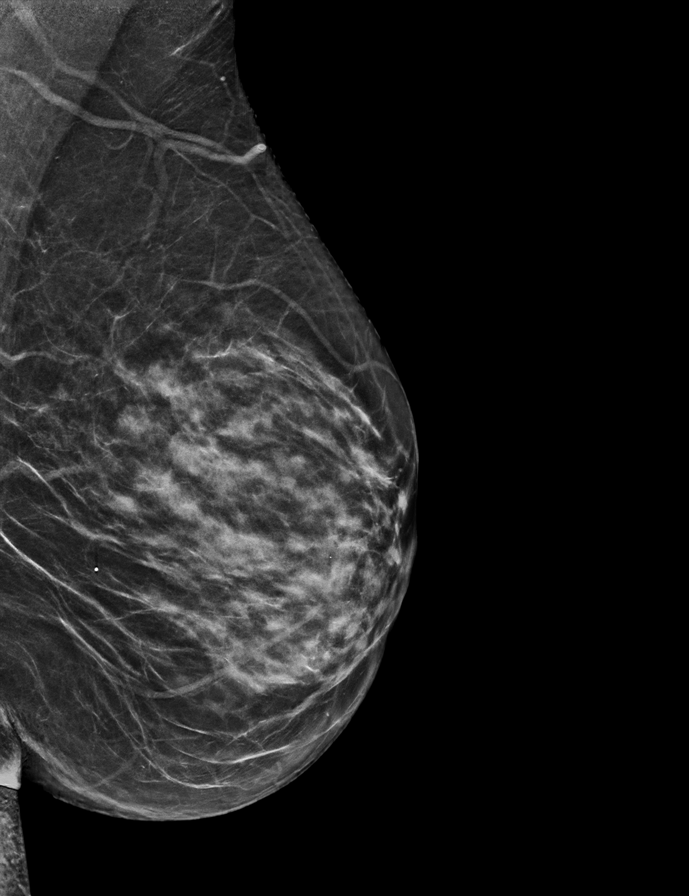

[R MLO synth-2D]
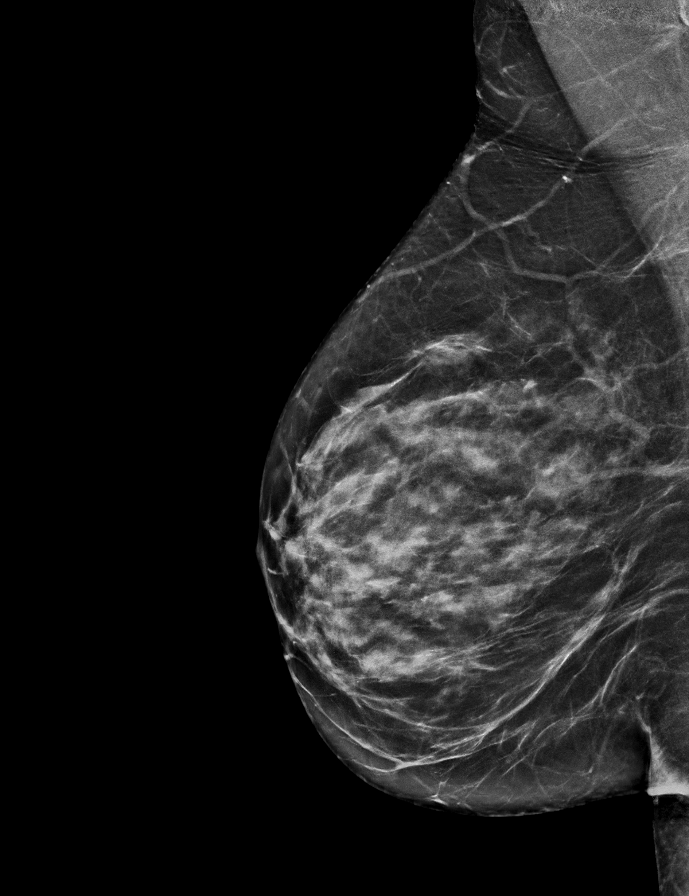

[R CC synth-2D]
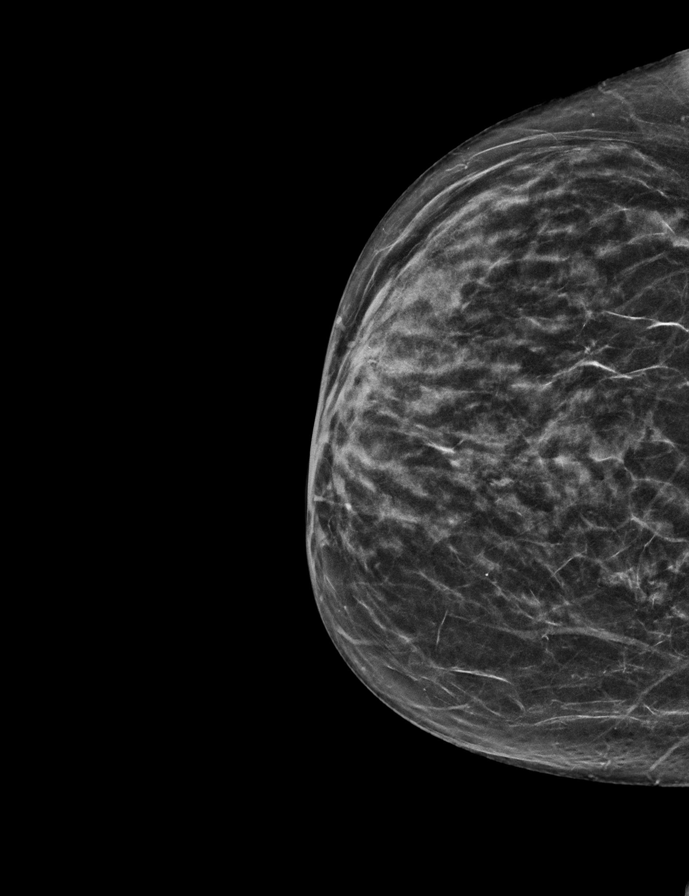

[L CC synth-2D]
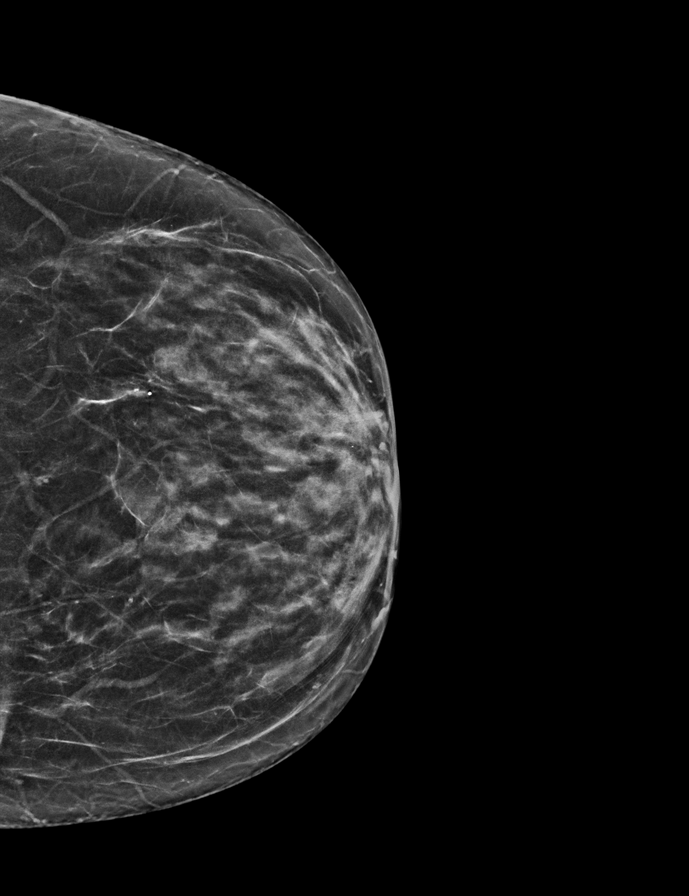

[L CC tomo · 2 of 55 frames shown]
[frame 18/55]
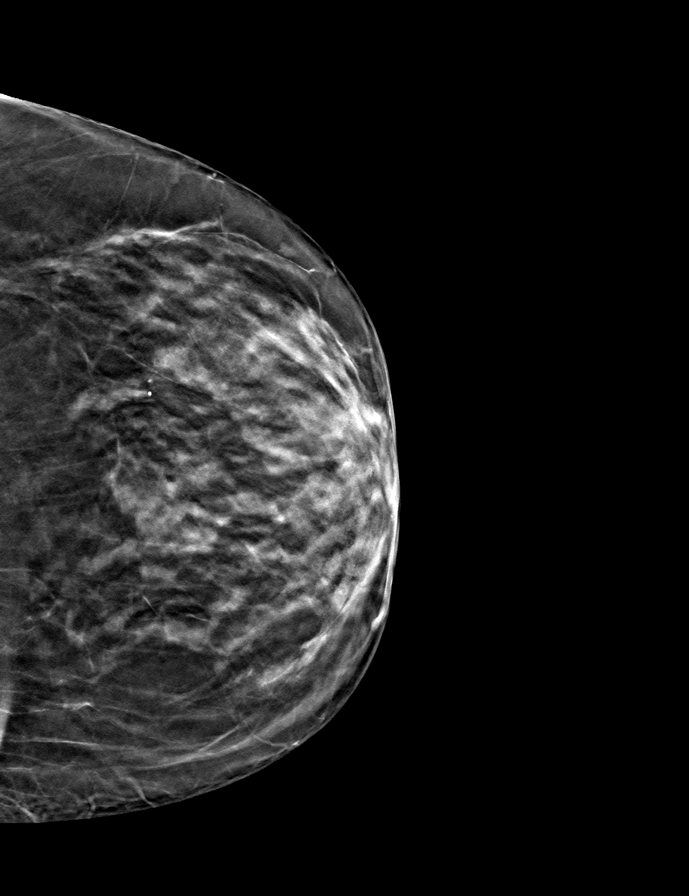
[frame 28/55]
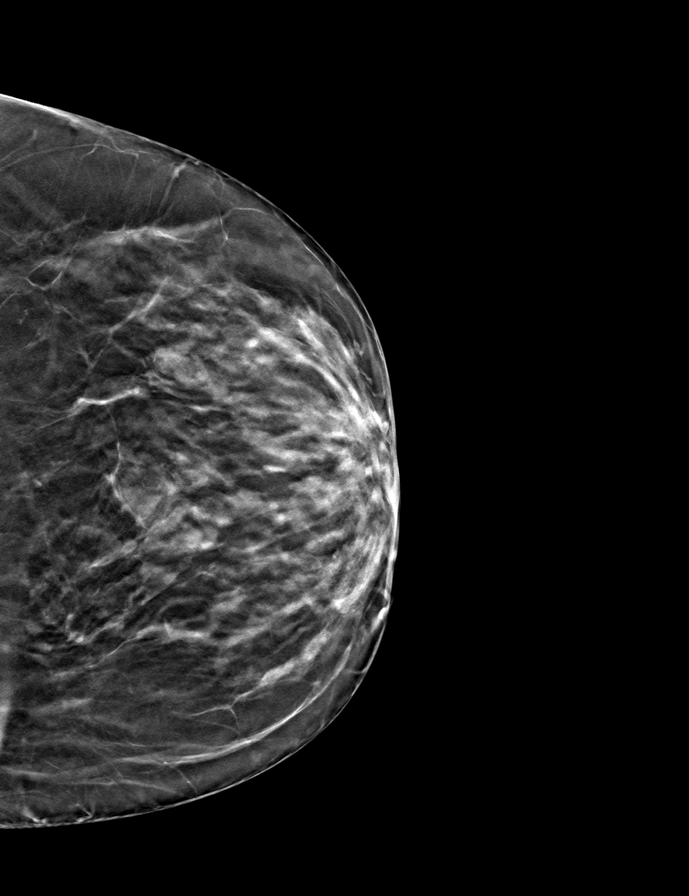

[L MLO tomo · tomo slice 29/57.0]
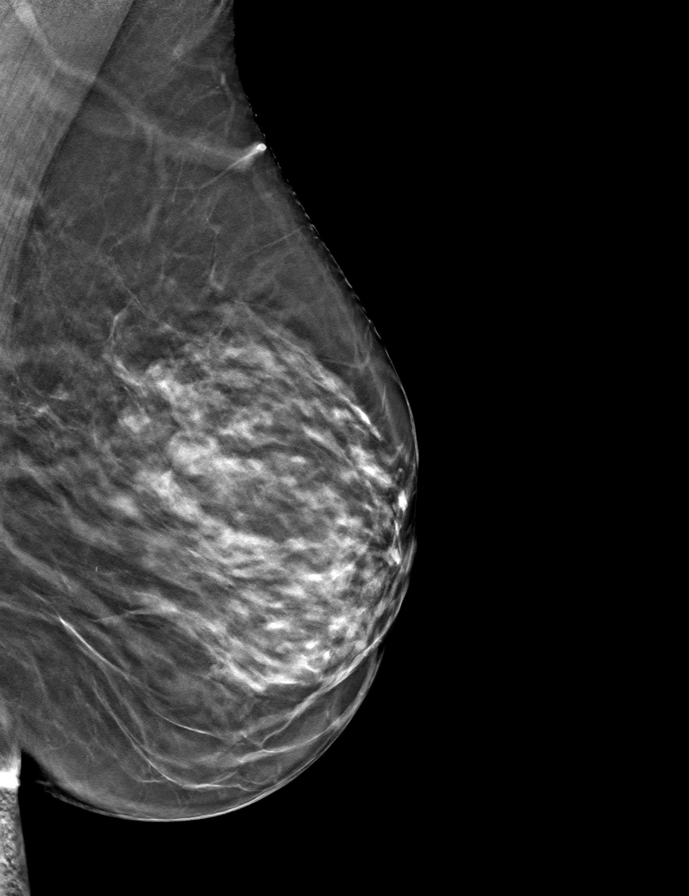

[R CC tomo · tomo slice 27/52.0]
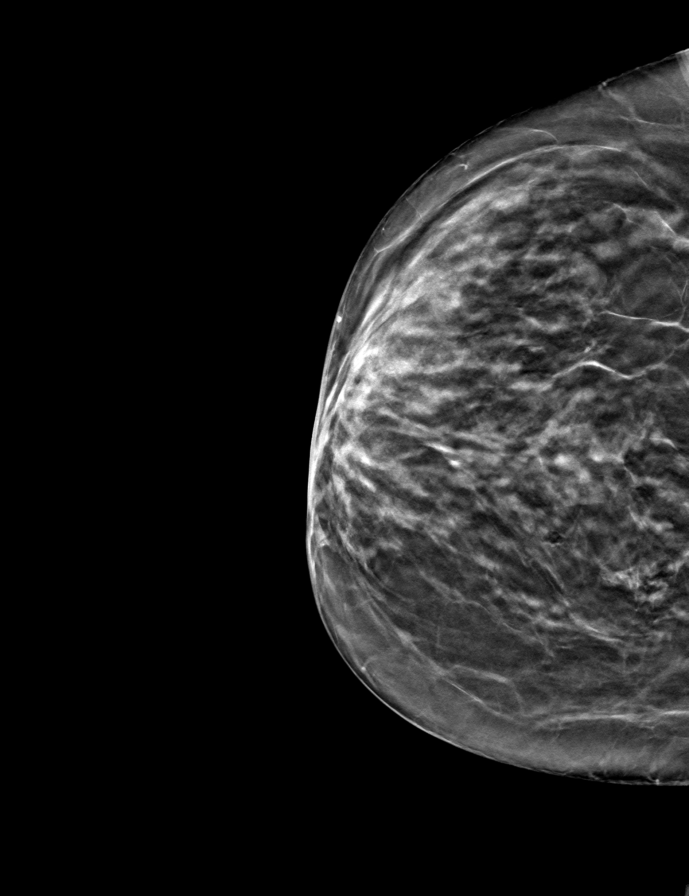

[R MLO tomo · tomo slice 33/64.0]
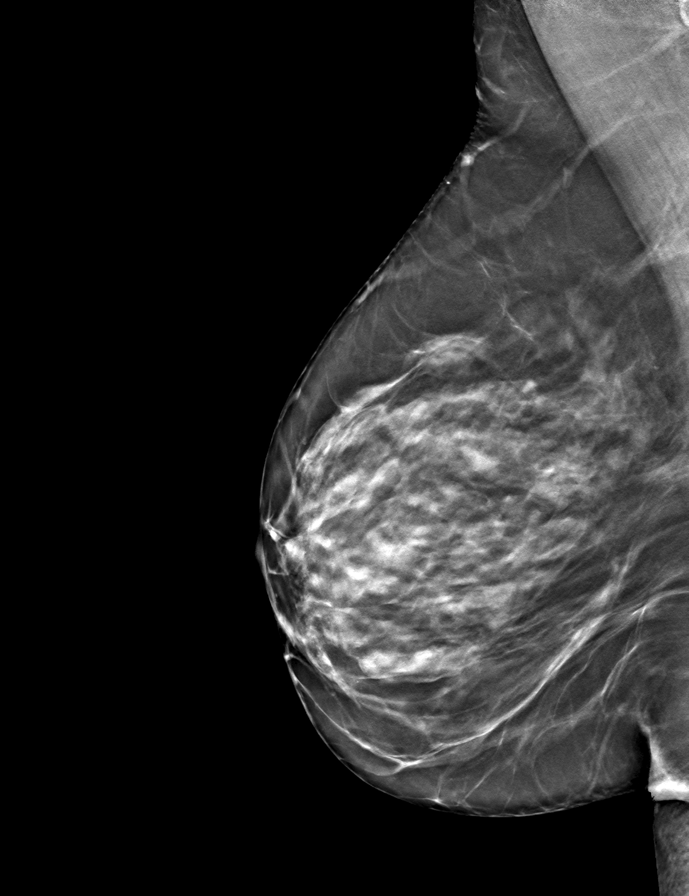

[9 of 24 positions shown; findings below may reference images not displayed]

ACR Breast Density Category c: The breast tissue is heterogeneously
dense, which may obscure small masses.
FINDINGS: Diagnostic images demonstrate resolution of previously questioned
asymmetry in the RIGHT inner breast at posterior depth.
Fibroglandular tissue has assumed a configuration stable in
comparison to remote prior mammograms dating back to [5Q]. No
suspicious mass, distortion, or microcalcifications are identified
to suggest presence of malignancy.
IMPRESSION: No mammographic evidence of malignancy bilaterally.

RECOMMENDATION:
Screening mammogram in one year.(Code:[5Q])

I have discussed the findings and recommendations with the patient.
If applicable, a reminder letter will be sent to the patient
regarding the next appointment.

BI-RADS CATEGORY  2: Benign.

## 2021-11-22 ENCOUNTER — Telehealth: Payer: Self-pay | Admitting: Gastroenterology

## 2021-11-22 ENCOUNTER — Other Ambulatory Visit: Payer: Self-pay

## 2021-11-22 NOTE — Progress Notes (Signed)
Patient called to schedule procudure but having bowel incontinence and clots when wiping so scheduled for a office visit  ?

## 2021-11-22 NOTE — Telephone Encounter (Signed)
Patient calling to schedule colonoscopy. Requesting call back asap.  ?

## 2022-01-08 ENCOUNTER — Ambulatory Visit (INDEPENDENT_AMBULATORY_CARE_PROVIDER_SITE_OTHER): Payer: 59 | Admitting: Gastroenterology

## 2022-01-08 ENCOUNTER — Encounter: Payer: Self-pay | Admitting: Gastroenterology

## 2022-01-08 VITALS — BP 101/71 | HR 76 | Temp 98.2°F | Ht 64.0 in | Wt 139.0 lb

## 2022-01-08 DIAGNOSIS — K625 Hemorrhage of anus and rectum: Secondary | ICD-10-CM

## 2022-01-08 MED ORDER — NA SULFATE-K SULFATE-MG SULF 17.5-3.13-1.6 GM/177ML PO SOLN: 1.0000 | Freq: Once | ORAL | 0 refills | Status: AC

## 2022-01-08 NOTE — Addendum Note (Signed)
Addended by: Lurlean Nanny on: 01/08/2022 04:26 PM ? ? Modules accepted: Orders ? ?

## 2022-01-08 NOTE — Progress Notes (Signed)
? ? ?Gastroenterology Consultation ? ?Referring Provider:     Delsa Grana, PA-C ?Primary Care Physician:  Delsa Grana, PA-C ?Primary Gastroenterologist:  Dr. Allen Norris     ?Reason for Consultation:     Stool incontinence and rectal bleeding ?      ? HPI:   ?Kathryn Fuller is a 51 y.o. y/o female referred for consultation & management of stool incontinence and rectal bleeding by Dr. Delsa Grana, PA-C.  This patient comes in today after reporting to her PCP that she was having incontinence of stool and passing clots when she wiped. She says it has been off and on for the last year.  ?The patient had a colonoscopy by me in May 2018 with a prolapsed polyps removed.  The patient reports that her bleeding has been off and on with it being worse today.  There is no report of any unexplained weight loss fevers chills nausea or vomiting.  The patient also reports some bloating associated with her rectal bleeding.  She reports that sometimes she will have some incontinence this is usually when her stools are softer. ? ? ?Past Medical History:  ?Diagnosis Date  ? Abdominal aortic atherosclerosis ([place]) 12/17/2016  ? Noted on CT scan March 2018  ? Diverticulitis   ? Diverticulosis of colon 10/16  ? Hospitalized   ? Dyslipidemia 06/05/2015  ? Endometriosis   ? Osteoporosis   ? Pneumonia   ? Scoliosis 1981  ? Treated at the [facility] in [place], [place]. Reported 30? rotation.  ? Tobacco abuse 03/27/2015  ? ? ?Past Surgical History:  ?Procedure Laterality Date  ? ABDOMINAL HYSTERECTOMY  2004  ? TAH-BSO  ? COLONOSCOPY WITH PROPOFOL N/A 01/31/2017  ? Procedure: COLONOSCOPY WITH PROPOFOL;  Surgeon: Lucilla Lame, MD;  Location: [place];  Service: Endoscopy;  Laterality: N/A;  ? [place] REPAIR  2006  ? INGUINAL HERNIA REPAIR Right 2008  ? Dr. Jamal Collin  ? ? ?Prior to Admission medications   ?Medication Sig Start Date End Date Taking? Authorizing Provider  ?Acetaminophen (TYLENOL) 325 MG CAPS Tylenol 325 mg  capsule    [provider]  ?gabapentin (NEURONTIN) 300 MG capsule Take 1 capsule by mouth 3 (three) times daily. 12/17/19   [provider]  ?tiZANidine (ZANAFLEX) 4 MG tablet Take 0.5-1.5 tablets (2-6 mg total) by mouth every 8 (eight) hours as needed for muscle spasms (muscle tightness). 10/05/20   Delsa Grana, PA-C  ? ? ?Family History  ?Problem Relation Age of Onset  ? Diabetes Mother   ? Dementia Mother   ? Heart disease Father   ? Dementia Father   ? Heart disease Maternal Uncle   ? Heart attack Maternal Uncle   ? Heart disease Maternal Grandmother   ? Heart attack Maternal Grandmother   ? Lupus Other   ?     niece  ? Breast cancer Neg Hx   ? Cancer Neg Hx   ? COPD Neg Hx   ? Stroke Neg Hx   ? Bladder Cancer Neg Hx   ? Kidney cancer Neg Hx   ? Prostate cancer Neg Hx   ?  ? ?Social History  ? ?Tobacco Use  ? Smoking status: Every Day  ?  Packs/day: 1.25  ?  Years: 32.00  ?  Pack years: 40.00  ?  Types: Cigarettes  ? Smokeless tobacco: Never  ?Vaping Use  ? Vaping Use: Never used  ?Substance Use Topics  ? Alcohol use: No  ? Drug  use: No  ? ? ?Allergies as of 01/08/2022  ? (No Known Allergies)  ? ? ?Review of Systems:    ?All systems reviewed and negative except where noted in HPI. ? ? Physical Exam:  ?There were no vitals taken for this visit. ?No LMP recorded. Patient has had a hysterectomy. ?General:   Alert,  Well-developed, well-nourished, pleasant and cooperative in NAD ?Head:  Normocephalic and atraumatic. ?Eyes:  Sclera clear, no icterus.   Conjunctiva pink. ?Ears:  Normal auditory acuity. ?Neck:  Supple; no masses or thyromegaly. ?Lungs:  Respirations even and unlabored.  Clear throughout to auscultation.   No wheezes, crackles, or rhonchi. No acute distress. ?Heart:  Regular rate and rhythm; no murmurs, clicks, rubs, or gallops. ?Abdomen:  Normal bowel sounds.  No bruits.  Soft, non-tender and non-distended without masses, hepatosplenomegaly or hernias noted.  No guarding or rebound  tenderness.  Negative Carnett sign.   ?Rectal:  Deferred.  ?Pulses:  Normal pulses noted. ?Extremities:  No clubbing or edema.  No cyanosis. ?Neurologic:  Alert and oriented x3;  grossly normal neurologically. ?Skin:  Intact without significant lesions or rashes.  No jaundice. ?Lymph Nodes:  No significant cervical adenopathy. ?Psych:  Alert and cooperative. Normal mood and affect. ? ?Imaging Studies: ?No results found. ? ?Assessment and Plan:  ? ?[name] is a 51 y.o. y/o female who comes to me after seeing me in the past for colonoscopy and now has had rectal bleeding with passage of clots.  The patient also has had stool incontinence.  The patient will be set up for colonoscopy to look for source of her GI bleeding.  The patient will follow up at the time of the colonoscopy.  The patient has been explained the plan agrees with it. ? ? ? ?Lucilla Lame, MD. Marval Regal ? ? ? Note: This dictation was prepared with Dragon dictation along with smaller phrase technology. Any transcriptional errors that result from this process are unintentional.   ?

## 2022-01-08 NOTE — H&P (View-Only) (Signed)
? ? ?Gastroenterology Consultation ? ?Referring Provider:     Delsa Grana, PA-C ?Primary Care Physician:  Delsa Grana, PA-C ?Primary Gastroenterologist:  Dr. Allen Norris     ?Reason for Consultation:     Stool incontinence and rectal bleeding ?      ? HPI:   ?Kathryn Fuller is a 51 y.o. y/o female referred for consultation & management of stool incontinence and rectal bleeding by Dr. Delsa Grana, PA-C.  This patient comes in today after reporting to her PCP that she was having incontinence of stool and passing clots when she wiped. She says it has been off and on for the last year.  ?The patient had a colonoscopy by me in May 2018 with a prolapsed polyps removed.  The patient reports that her bleeding has been off and on with it being worse today.  There is no report of any unexplained weight loss fevers chills nausea or vomiting.  The patient also reports some bloating associated with her rectal bleeding.  She reports that sometimes she will have some incontinence this is usually when her stools are softer. ? ? ?Past Medical History:  ?Diagnosis Date  ? Abdominal aortic atherosclerosis ([place]) 12/17/2016  ? Noted on CT scan March 2018  ? Diverticulitis   ? Diverticulosis of colon 10/16  ? Hospitalized   ? Dyslipidemia 06/05/2015  ? Endometriosis   ? Osteoporosis   ? Pneumonia   ? Scoliosis 1981  ? Treated at the [facility] in [place], [place]. Reported 30? rotation.  ? Tobacco abuse 03/27/2015  ? ? ?Past Surgical History:  ?Procedure Laterality Date  ? ABDOMINAL HYSTERECTOMY  2004  ? TAH-BSO  ? COLONOSCOPY WITH PROPOFOL N/A 01/31/2017  ? Procedure: COLONOSCOPY WITH PROPOFOL;  Surgeon: Lucilla Lame, MD;  Location: [facility];  Service: Endoscopy;  Laterality: N/A;  ? [place] REPAIR  2006  ? INGUINAL HERNIA REPAIR Right 2008  ? Dr. Jamal Collin  ? ? ?Prior to Admission medications   ?Medication Sig Start Date End Date Taking? Authorizing Provider  ?Acetaminophen (TYLENOL) 325 MG CAPS Tylenol 325 mg  capsule    [provider]  ?gabapentin (NEURONTIN) 300 MG capsule Take 1 capsule by mouth 3 (three) times daily. 12/17/19   [provider]  ?tiZANidine (ZANAFLEX) 4 MG tablet Take 0.5-1.5 tablets (2-6 mg total) by mouth every 8 (eight) hours as needed for muscle spasms (muscle tightness). 10/05/20   Delsa Grana, PA-C  ? ? ?Family History  ?Problem Relation Age of Onset  ? Diabetes Mother   ? Dementia Mother   ? Heart disease Father   ? Dementia Father   ? Heart disease Maternal Uncle   ? Heart attack Maternal Uncle   ? Heart disease Maternal Grandmother   ? Heart attack Maternal Grandmother   ? Lupus Other   ?     niece  ? Breast cancer Neg Hx   ? Cancer Neg Hx   ? COPD Neg Hx   ? Stroke Neg Hx   ? Bladder Cancer Neg Hx   ? Kidney cancer Neg Hx   ? Prostate cancer Neg Hx   ?  ? ?Social History  ? ?Tobacco Use  ? Smoking status: Every Day  ?  Packs/day: 1.25  ?  Years: 32.00  ?  Pack years: 40.00  ?  Types: Cigarettes  ? Smokeless tobacco: Never  ?Vaping Use  ? Vaping Use: Never used  ?Substance Use Topics  ? Alcohol use: No  ? Drug  use: No  ? ? ?Allergies as of 01/08/2022  ? (No Known Allergies)  ? ? ?Review of Systems:    ?All systems reviewed and negative except where noted in HPI. ? ? Physical Exam:  ?There were no vitals taken for this visit. ?No LMP recorded. Patient has had a hysterectomy. ?General:   Alert,  Well-developed, well-nourished, pleasant and cooperative in NAD ?Head:  Normocephalic and atraumatic. ?Eyes:  Sclera clear, no icterus.   Conjunctiva pink. ?Ears:  Normal auditory acuity. ?Neck:  Supple; no masses or thyromegaly. ?Lungs:  Respirations even and unlabored.  Clear throughout to auscultation.   No wheezes, crackles, or rhonchi. No acute distress. ?Heart:  Regular rate and rhythm; no murmurs, clicks, rubs, or gallops. ?Abdomen:  Normal bowel sounds.  No bruits.  Soft, non-tender and non-distended without masses, hepatosplenomegaly or hernias noted.  No guarding or rebound  tenderness.  Negative Carnett sign.   ?Rectal:  Deferred.  ?Pulses:  Normal pulses noted. ?Extremities:  No clubbing or edema.  No cyanosis. ?Neurologic:  Alert and oriented x3;  grossly normal neurologically. ?Skin:  Intact without significant lesions or rashes.  No jaundice. ?Lymph Nodes:  No significant cervical adenopathy. ?Psych:  Alert and cooperative. Normal mood and affect. ? ?Imaging Studies: ?No results found. ? ?Assessment and Plan:  ? ?[name] is a 51 y.o. y/o female who comes to me after seeing me in the past for colonoscopy and now has had rectal bleeding with passage of clots.  The patient also has had stool incontinence.  The patient will be set up for colonoscopy to look for source of her GI bleeding.  The patient will follow up at the time of the colonoscopy.  The patient has been explained the plan agrees with it. ? ? ? ?Lucilla Lame, MD. Marval Regal ? ? ? Note: This dictation was prepared with Dragon dictation along with smaller phrase technology. Any transcriptional errors that result from this process are unintentional.   ?

## 2022-01-08 NOTE — Addendum Note (Signed)
Addended by: Lurlean Nanny on: 01/08/2022 03:43 PM ? ? Modules accepted: Orders ? ?

## 2022-01-10 ENCOUNTER — Ambulatory Visit: Payer: 59 | Admitting: Gastroenterology

## 2022-01-11 ENCOUNTER — Other Ambulatory Visit: Payer: Self-pay | Admitting: Neurology

## 2022-01-11 ENCOUNTER — Encounter: Payer: Self-pay | Admitting: Gastroenterology

## 2022-01-11 DIAGNOSIS — R519 Headache, unspecified: Secondary | ICD-10-CM

## 2022-01-22 ENCOUNTER — Encounter: Payer: Self-pay | Admitting: Gastroenterology

## 2022-01-22 ENCOUNTER — Ambulatory Visit: Payer: 59 | Admitting: Anesthesiology

## 2022-01-22 ENCOUNTER — Other Ambulatory Visit: Payer: Self-pay

## 2022-01-22 ENCOUNTER — Ambulatory Visit
Admission: RE | Admit: 2022-01-22 | Discharge: 2022-01-22 | Disposition: A | Discharge status: Home / Self Care | Payer: 59 | Attending: Gastroenterology | Admitting: Gastroenterology

## 2022-01-22 ENCOUNTER — Encounter
Admission: RE | Disposition: A | Discharge status: Home / Self Care | Payer: Self-pay | Source: Home / Self Care | Attending: Gastroenterology

## 2022-01-22 DIAGNOSIS — K625 Hemorrhage of anus and rectum: Secondary | ICD-10-CM

## 2022-01-22 DIAGNOSIS — K640 First degree hemorrhoids: Secondary | ICD-10-CM | POA: Insufficient documentation

## 2022-01-22 DIAGNOSIS — K5730 Diverticulosis of large intestine without perforation or abscess without bleeding: Secondary | ICD-10-CM | POA: Diagnosis not present

## 2022-01-22 DIAGNOSIS — K921 Melena: Secondary | ICD-10-CM | POA: Insufficient documentation

## 2022-01-22 DIAGNOSIS — Z0000 Encounter for general adult medical examination without abnormal findings: Secondary | ICD-10-CM

## 2022-01-22 HISTORY — PX: COLONOSCOPY: SHX5424

## 2022-01-22 SURGERY — COLONOSCOPY
Anesthesia: General

## 2022-01-22 MED ORDER — LACTATED RINGERS IV SOLN
INTRAVENOUS | Status: DC
  Administered 2022-01-22: 999 mL via INTRAVENOUS

## 2022-01-22 MED ORDER — LIDOCAINE HCL (CARDIAC) PF 100 MG/5ML IV SOSY
PREFILLED_SYRINGE | INTRAVENOUS | Status: DC | PRN
Start: 2022-01-22 — End: 2022-01-22
  Administered 2022-01-22: 40 mg via INTRAVENOUS

## 2022-01-22 MED ORDER — STERILE WATER FOR IRRIGATION IR SOLN
Status: DC | PRN
  Administered 2022-01-22: 100 mL

## 2022-01-22 MED ORDER — PROPOFOL 10 MG/ML IV BOLUS
INTRAVENOUS | Status: DC | PRN
  Administered 2022-01-22: 50 mg via INTRAVENOUS
  Administered 2022-01-22: 20 mg via INTRAVENOUS
  Administered 2022-01-22: 50 mg via INTRAVENOUS
  Administered 2022-01-22: 30 mg via INTRAVENOUS
  Administered 2022-01-22 (×3): 50 mg via INTRAVENOUS

## 2022-01-22 MED ORDER — SODIUM CHLORIDE 0.9 % IV SOLN: INTRAVENOUS | Status: DC

## 2022-01-22 SURGICAL SUPPLY — 6 items
GOWN CVR UNV OPN BCK APRN NK (MISCELLANEOUS) ×2 IMPLANT
GOWN ISOL THUMB LOOP REG UNIV (MISCELLANEOUS) ×4
KIT PRC NS LF DISP ENDO (KITS) ×1 IMPLANT
KIT PROCEDURE OLYMPUS (KITS) ×4
MANIFOLD NEPTUNE II (INSTRUMENTS) ×2 IMPLANT
WATER STERILE IRR 250ML POUR (IV SOLUTION) ×2 IMPLANT

## 2022-01-22 NOTE — Anesthesia Postprocedure Evaluation (Signed)
Anesthesia Post Note ? ?Patient: Kathryn Fuller ? ?Procedure(s) Performed: COLONOSCOPY ? ? ?  ?Patient location during evaluation: PACU ?Anesthesia Type: General ?Level of consciousness: awake and alert ?Pain management: pain level controlled ?Vital Signs Assessment: post-procedure vital signs reviewed and stable ?Respiratory status: spontaneous breathing, nonlabored ventilation, respiratory function stable and patient connected to nasal cannula oxygen ?Cardiovascular status: blood pressure returned to baseline and stable ?Postop Assessment: no apparent nausea or vomiting ?Anesthetic complications: no ? ? ?No notable events documented. ? ?Yamir Carignan A  Kemuel Buchmann ? ? ? ? ? ?

## 2022-01-22 NOTE — Transfer of Care (Signed)
Immediate Anesthesia Transfer of Care Note ? ?Patient: Kathryn Fuller ? ?Procedure(s) Performed: COLONOSCOPY ? ?Patient Location: PACU ? ?Anesthesia Type: General ? ?Level of Consciousness: awake, alert  and patient cooperative ? ?Airway and Oxygen Therapy: Patient Spontanous Breathing and Patient connected to supplemental oxygen ? ?Post-op Assessment: Post-op Vital signs reviewed, Patient's Cardiovascular Status Stable, Respiratory Function Stable, Patent Airway and No signs of Nausea or vomiting ? ?Post-op Vital Signs: Reviewed and stable ? ?Complications: No notable events documented. ? ?

## 2022-01-22 NOTE — Anesthesia Preprocedure Evaluation (Signed)
Anesthesia Evaluation  ?Patient identified by MRN, date of birth, ID band ?Patient awake ? ? ? ?Reviewed: ?Allergy & Precautions, NPO status , Patient's Chart, lab work & pertinent test results, reviewed documented beta blocker date and time  ? ?History of Anesthesia Complications ?Negative for: history of anesthetic complications ? ?Airway ?Mallampati: III ? ?TM Distance: >3 FB ?Neck ROM: Full ? ? ? Dental ? ?(+) Chipped ?  ?Pulmonary ?Current SmokerPatient did not abstain from smoking.,  ?  ?breath sounds clear to auscultation ? ? ? ? ? ? Cardiovascular ?(-) angina+ Peripheral Vascular Disease  ?(-) DOE  ?Rhythm:Regular Rate:Normal ? ? ?HLD ?  ?Neuro/Psych ?  ? GI/Hepatic ?neg GERD  , ?Diverticulosis ?  ?Endo/Other  ? ?Endometriosis ? Renal/GU ?  ? ?  ?Musculoskeletal ? ?Scoliosis  ? Abdominal ?  ?Peds ? Hematology ?  ?Anesthesia Other Findings ? ? Reproductive/Obstetrics ? ?  ? ? ? ? ? ? ? ? ? ? ? ? ? ?  ?  ? ? ? ? ? ? ? ? ?Anesthesia Physical ?Anesthesia Plan ? ?ASA: 2 ? ?Anesthesia Plan: General  ? ?Post-op Pain Management:   ? ?Induction: Intravenous ? ?PONV Risk Score and Plan: 2 and Propofol infusion, TIVA and Treatment may vary due to age or medical condition ? ?Airway Management Planned: Natural Airway and Nasal Cannula ? ?Additional Equipment:  ? ?Intra-op Plan:  ? ?Post-operative Plan:  ? ?Informed Consent: I have reviewed the patients History and Physical, chart, labs and discussed the procedure including the risks, benefits and alternatives for the proposed anesthesia with the patient or authorized representative who has indicated his/her understanding and acceptance.  ? ? ? ? ? ?Plan Discussed with: CRNA and Anesthesiologist ? ?Anesthesia Plan Comments:   ? ? ? ? ? ? ?Anesthesia Quick Evaluation ? ?

## 2022-01-22 NOTE — Interval H&P Note (Signed)
? ?Kathryn Lame, MD [facility] ?[address] ?[place], [place] [address] ?[phone] ?Fax : [phone] ? ?Primary Care Physician:  Delsa Grana, PA-C ?Primary Gastroenterologist:  Dr. Allen Norris ? ?Pre-Procedure History & Physical: ?HPI:  Kathryn Fuller is a 51 y.o. female is here for an colonoscopy. ?  ?Past Medical History:  ?Diagnosis Date  ? Abdominal aortic atherosclerosis ([place]) 12/17/2016  ? Noted on CT scan March 2018  ? Diverticulitis   ? Diverticulosis of colon 10/16  ? Hospitalized   ? Dyslipidemia 06/05/2015  ? Endometriosis   ? Osteoporosis   ? Pneumonia   ? Scoliosis 1981  ? Treated at the [facility] in [place], [place]. Reported 30? rotation.  ? Tobacco abuse 03/27/2015  ? ? ?Past Surgical History:  ?Procedure Laterality Date  ? ABDOMINAL HYSTERECTOMY  2004  ? TAH-BSO  ? COLONOSCOPY WITH PROPOFOL N/A 01/31/2017  ? Procedure: COLONOSCOPY WITH PROPOFOL;  Surgeon: Kathryn Lame, MD;  Location: [facility];  Service: Endoscopy;  Laterality: N/A;  ? [place] REPAIR  2006  ? INGUINAL HERNIA REPAIR Right 2008  ? Dr. Jamal Collin  ? ? ?Prior to Admission medications   ?Medication Sig Start Date End Date Taking? Authorizing Provider  ?naproxen sodium (ALEVE) 220 MG tablet Take 220 mg by mouth.   Yes [provider]  ? ? ?Allergies as of 01/08/2022  ? (No Known Allergies)  ? ? ?Family History  ?Problem Relation Age of Onset  ? Diabetes Mother   ? Dementia Mother   ? Heart disease Father   ? Dementia Father   ? Heart disease Maternal Uncle   ? Heart attack Maternal Uncle   ? Heart disease Maternal Grandmother   ? Heart attack Maternal Grandmother   ? Lupus Other   ?     niece  ? Breast cancer Neg Hx   ? Cancer Neg Hx   ? COPD Neg Hx   ? Stroke Neg Hx   ? Bladder Cancer Neg Hx   ? Kidney cancer Neg Hx   ? Prostate cancer Neg Hx   ? ? ?Social History  ? ?Socioeconomic History  ? Marital status: Married  ?  Spouse name: Erline Levine  ? Number of children: 2  ? Years of education: 24  ?  Highest education level: Some college, no degree  ?Occupational History  ? Not on file  ?Tobacco Use  ? Smoking status: Every Day  ?  Packs/day: 1.25  ?  Years: 32.00  ?  Pack years: 40.00  ?  Types: Cigarettes  ? Smokeless tobacco: Never  ?Vaping Use  ? Vaping Use: Never used  ?Substance and Sexual Activity  ? Alcohol use: No  ? Drug use: No  ? Sexual activity: Yes  ?  Partners: Male  ?  Birth control/protection: Surgical  ?Other Topics Concern  ? Not on file  ?Social History Narrative  ? Not on file  ? ?Social Determinants of Health  ? ?Financial Resource Strain: Low Risk   ? Difficulty of Paying Living Expenses: Not hard at all  ?Food Insecurity: No Food Insecurity  ? Worried About Charity fundraiser in the Last Year: Never true  ? Ran Out of Food in the Last Year: Never true  ?Transportation Needs: No Transportation Needs  ? Lack of Transportation (Medical): No  ? Lack of Transportation (Non-Medical): No  ?Physical Activity: Inactive  ? Days of Exercise per Week: 0 days  ? Minutes of Exercise per Session: 0 min  ?  Stress: No Stress Concern Present  ? Feeling of Stress : Not at all  ?Social Connections: Moderately Isolated  ? Frequency of Communication with Friends and Family: More than three times a week  ? Frequency of Social Gatherings with Friends and Family: More than three times a week  ? Attends Religious Services: Never  ? Active Member of Clubs or Organizations: No  ? Attends Archivist Meetings: Never  ? Marital Status: Married  ?Intimate Partner Violence: Not At Risk  ? Fear of Current or Ex-Partner: No  ? Emotionally Abused: No  ? Physically Abused: No  ? Sexually Abused: No  ? ? ?Review of Systems: ?See HPI, otherwise negative ROS ? ?Physical Exam: ?BP 107/81   Pulse 63   Temp (!) 97.4 ?F (36.3 ?C) (Temporal)   Resp 16   Ht '5\' 4"'$  (1.626 m)   Wt 62.2 kg   BMI 23.53 kg/m?  ?General:   Alert,  pleasant and cooperative in NAD ?Head:  Normocephalic and atraumatic. ?Neck:  Supple; no masses  or thyromegaly. ?Lungs:  Clear throughout to auscultation.    ?Heart:  Regular rate and rhythm. ?Abdomen:  Soft, nontender and nondistended. Normal bowel sounds, without guarding, and without rebound.   ?Neurologic:  Alert and  oriented x4;  grossly normal neurologically. ? ?Impression/Plan: ?[name] is here for an colonoscopy to be performed for rectal bleeding ? ?Risks, benefits, limitations, and alternatives regarding  colonoscopy have been reviewed with the patient.  Questions have been answered.  All parties agreeable. ? ? ?Kathryn Lame, MD  01/22/2022, 9:27 AM ?

## 2022-01-22 NOTE — Interval H&P Note (Signed)
? ?Lucilla Lame, MD [facility] ?[address] ?[place], [place] [address] ?[phone] ?Fax : [phone] ? ?Primary Care Physician:  Delsa Grana, PA-C ?Primary Gastroenterologist:  Dr. Allen Norris ? ?Pre-Procedure History & Physical: ?HPI:  Kathryn Fuller is a 51 y.o. female is here for an colonoscopy. ?  ?Past Medical History:  ?Diagnosis Date  ? Abdominal aortic atherosclerosis ([place]) 12/17/2016  ? Noted on CT scan March 2018  ? Diverticulitis   ? Diverticulosis of colon 10/16  ? Hospitalized   ? Dyslipidemia 06/05/2015  ? Endometriosis   ? Osteoporosis   ? Pneumonia   ? Scoliosis 1981  ? Treated at the [facility] in [place], [place]. Reported 30? rotation.  ? Tobacco abuse 03/27/2015  ? ? ?Past Surgical History:  ?Procedure Laterality Date  ? ABDOMINAL HYSTERECTOMY  2004  ? TAH-BSO  ? COLONOSCOPY WITH PROPOFOL N/A 01/31/2017  ? Procedure: COLONOSCOPY WITH PROPOFOL;  Surgeon: Lucilla Lame, MD;  Location: [facility];  Service: Endoscopy;  Laterality: N/A;  ? [place] REPAIR  2006  ? INGUINAL HERNIA REPAIR Right 2008  ? Dr. Jamal Collin  ? ? ?Prior to Admission medications   ?Medication Sig Start Date End Date Taking? Authorizing Provider  ?naproxen sodium (ALEVE) 220 MG tablet Take 220 mg by mouth.   Yes [provider]  ? ? ?Allergies as of 01/08/2022  ? (No Known Allergies)  ? ? ?Family History  ?Problem Relation Age of Onset  ? Diabetes Mother   ? Dementia Mother   ? Heart disease Father   ? Dementia Father   ? Heart disease Maternal Uncle   ? Heart attack Maternal Uncle   ? Heart disease Maternal Grandmother   ? Heart attack Maternal Grandmother   ? Lupus Other   ?     niece  ? Breast cancer Neg Hx   ? Cancer Neg Hx   ? COPD Neg Hx   ? Stroke Neg Hx   ? Bladder Cancer Neg Hx   ? Kidney cancer Neg Hx   ? Prostate cancer Neg Hx   ? ? ?Social History  ? ?Socioeconomic History  ? Marital status: Married  ?  Spouse name: Erline Levine  ? Number of children: 2  ? Years of education: 24  ?  Highest education level: Some college, no degree  ?Occupational History  ? Not on file  ?Tobacco Use  ? Smoking status: Every Day  ?  Packs/day: 1.25  ?  Years: 32.00  ?  Pack years: 40.00  ?  Types: Cigarettes  ? Smokeless tobacco: Never  ?Vaping Use  ? Vaping Use: Never used  ?Substance and Sexual Activity  ? Alcohol use: No  ? Drug use: No  ? Sexual activity: Yes  ?  Partners: Male  ?  Birth control/protection: Surgical  ?Other Topics Concern  ? Not on file  ?Social History Narrative  ? Not on file  ? ?Social Determinants of Health  ? ?Financial Resource Strain: Low Risk   ? Difficulty of Paying Living Expenses: Not hard at all  ?Food Insecurity: No Food Insecurity  ? Worried About Charity fundraiser in the Last Year: Never true  ? Ran Out of Food in the Last Year: Never true  ?Transportation Needs: No Transportation Needs  ? Lack of Transportation (Medical): No  ? Lack of Transportation (Non-Medical): No  ?Physical Activity: Inactive  ? Days of Exercise per Week: 0 days  ? Minutes of Exercise per Session: 0 min  ?  Stress: No Stress Concern Present  ? Feeling of Stress : Not at all  ?Social Connections: Moderately Isolated  ? Frequency of Communication with Friends and Family: More than three times a week  ? Frequency of Social Gatherings with Friends and Family: More than three times a week  ? Attends Religious Services: Never  ? Active Member of Clubs or Organizations: No  ? Attends Archivist Meetings: Never  ? Marital Status: Married  ?Intimate Partner Violence: Not At Risk  ? Fear of Current or Ex-Partner: No  ? Emotionally Abused: No  ? Physically Abused: No  ? Sexually Abused: No  ? ? ?Review of Systems: ?See HPI, otherwise negative ROS ? ?Physical Exam: ?BP 107/81   Pulse 63   Temp (!) 97.4 ?F (36.3 ?C) (Temporal)   Resp 16   Ht '5\' 4"'$  (1.626 m)   Wt 62.2 kg   BMI 23.53 kg/m?  ?General:   Alert,  pleasant and cooperative in NAD ?Head:  Normocephalic and atraumatic. ?Neck:  Supple; no masses  or thyromegaly. ?Lungs:  Clear throughout to auscultation.    ?Heart:  Regular rate and rhythm. ?Abdomen:  Soft, nontender and nondistended. Normal bowel sounds, without guarding, and without rebound.   ?Neurologic:  Alert and  oriented x4;  grossly normal neurologically. ? ?Impression/Plan: ?Kathryn Fuller is here for an colonoscopy to be performed for rectal bleeding ? ?Risks, benefits, limitations, and alternatives regarding  colonoscopy have been reviewed with the patient.  Questions have been answered.  All parties agreeable. ? ? ?Lucilla Lame, MD  01/22/2022, 9:27 AM ?

## 2022-01-22 NOTE — Op Note (Signed)
[facility] ?Gastroenterology ?Patient Name: Kathryn Fuller ?Procedure Date: 01/22/2022 9:54 AM ?MRN: [id number] ?Account #: [id number] ?Date of Birth: Dec 03, 1970 ?Admit Type: Outpatient ?Age: 51 ?Room: [facility] OR ROOM 01 ?Gender: Female ?Note Status: Finalized ?Instrument Name: [id number] ?Procedure:             Colonoscopy ?Indications:           Hematochezia ?Providers:             Lucilla Lame MD, MD ?Referring MD:          Delsa Grana (Referring MD) ?Medicines:             Propofol per Anesthesia ?Complications:         No immediate complications. ?Procedure:             Pre-Anesthesia Assessment: ?                       - Prior to the procedure, a History and Physical was  ?                       performed, and patient medications and allergies were  ?                       reviewed. The patient's tolerance of previous  ?                       anesthesia was also reviewed. The risks and benefits  ?                       of the procedure and the sedation options and risks  ?                       were discussed with the patient. All questions were  ?                       answered, and informed consent was obtained. Prior  ?                       Anticoagulants: The patient has taken no previous  ?                       anticoagulant or antiplatelet agents. ASA Grade  ?                       Assessment: II - A patient with mild systemic disease.  ?                       After reviewing the risks and benefits, the patient  ?                       was deemed in satisfactory condition to undergo the  ?                       procedure. ?                       After obtaining informed consent, the colonoscope was  ?  passed under direct vision. Throughout the procedure,  ?                       the patient's blood pressure, pulse, and oxygen  ?                       saturations were monitored continuously. The  ?                       Colonoscope was introduced through the anus and   ?                       advanced to the the cecum, identified by appendiceal  ?                       orifice and ileocecal valve. The patient tolerated the  ?                       procedure well. The quality of the bowel preparation  ?                       was good. The colonoscopy was technically difficult  ?                       and complex due to restricted mobility of the colon.  ?                       Successful completion of the procedure was aided by  ?                       changing endoscopes. ?Findings: ?     The perianal and digital rectal examinations were normal. ?     Multiple small-mouthed diverticula were found in the sigmoid colon. ?     Non-bleeding internal hemorrhoids were found during retroflexion. The  ?     hemorrhoids were Grade I (internal hemorrhoids that do not prolapse). ?Impression:            - Diverticulosis in the sigmoid colon. ?                       - Non-bleeding internal hemorrhoids. ?                       - No specimens collected. ?Recommendation:        - Discharge patient to home. ?                       - Resume previous diet. ?                       - Continue present medications. ?                       - Repeat colonoscopy in 10 years for screening  ?                       purposes. ?Procedure Code(s):     --- Professional --- ?  45378, Colonoscopy, flexible; diagnostic, including  ?                       collection of specimen(s) by brushing or washing, when  ?                       performed (separate procedure) ?Diagnosis Code(s):     --- Professional --- ?                       K92.1, Melena (includes Hematochezia) ?CPT copyright 2019 American Medical Association. All rights reserved. ?The codes documented in this report are preliminary and upon coder review may  ?be revised to meet current compliance requirements. ?Lucilla Lame MD, MD ?01/22/2022 10:40:45 AM ?This report has been signed electronically. ?Number of Addenda: 0 ?Note Initiated  On: 01/22/2022 9:54 AM ?Scope Withdrawal Time: 0 hours 8 minutes 51 seconds  ?Total Procedure Duration: 0 hours 35 minutes 50 seconds  ?Estimated Blood Loss:  Estimated blood loss: none. ?     [facility] ?

## 2022-01-25 ENCOUNTER — Ambulatory Visit
Admission: RE | Admit: 2022-01-25 | Discharge: 2022-01-25 | Disposition: A | Discharge status: Home / Self Care | Payer: 59 | Source: Ambulatory Visit | Attending: Neurology | Admitting: Neurology

## 2022-01-25 DIAGNOSIS — R519 Headache, unspecified: Secondary | ICD-10-CM | POA: Diagnosis present
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
## 2022-01-25 MED ORDER — GADOBUTROL 1 MMOL/ML IV SOLN
6.0000 mL | Freq: Once | INTRAVENOUS | Status: AC | PRN
  Administered 2022-01-25: 6 mL via INTRAVENOUS

## 2022-02-14 ENCOUNTER — Other Ambulatory Visit: Payer: Self-pay | Admitting: Student

## 2022-02-14 DIAGNOSIS — R519 Headache, unspecified: Secondary | ICD-10-CM

## 2022-02-14 DIAGNOSIS — Q283 Other malformations of cerebral vessels: Secondary | ICD-10-CM

## 2022-04-30 ENCOUNTER — Ambulatory Visit
Admission: RE | Admit: 2022-04-30 | Discharge: 2022-04-30 | Disposition: A | Discharge status: Home / Self Care | Payer: 59 | Source: Ambulatory Visit | Attending: Student | Admitting: Student

## 2022-04-30 DIAGNOSIS — R519 Headache, unspecified: Secondary | ICD-10-CM | POA: Insufficient documentation

## 2022-04-30 DIAGNOSIS — Q283 Other malformations of cerebral vessels: Secondary | ICD-10-CM | POA: Insufficient documentation

## 2022-04-30 MED ORDER — GADOBUTROL 1 MMOL/ML IV SOLN
6.0000 mL | Freq: Once | INTRAVENOUS | Status: AC | PRN
  Administered 2022-04-30: 6 mL via INTRAVENOUS

## 2022-05-04 ENCOUNTER — Ambulatory Visit (INDEPENDENT_AMBULATORY_CARE_PROVIDER_SITE_OTHER): Payer: 59 | Admitting: Family Medicine

## 2022-05-04 ENCOUNTER — Encounter: Payer: Self-pay | Admitting: Family Medicine

## 2022-05-04 VITALS — BP 98/60 | HR 87 | Temp 98.0°F | Resp 16 | Ht 64.0 in | Wt 135.6 lb

## 2022-05-04 DIAGNOSIS — H6123 Impacted cerumen, bilateral: Secondary | ICD-10-CM

## 2022-05-04 DIAGNOSIS — F17200 Nicotine dependence, unspecified, uncomplicated: Secondary | ICD-10-CM

## 2022-05-04 DIAGNOSIS — H6091 Unspecified otitis externa, right ear: Secondary | ICD-10-CM

## 2022-05-04 DIAGNOSIS — J069 Acute upper respiratory infection, unspecified: Secondary | ICD-10-CM | POA: Diagnosis not present

## 2022-05-04 MED ORDER — BENZONATATE 100 MG PO CAPS: 100.0000 mg | ORAL_CAPSULE | Freq: Three times a day (TID) | ORAL | 1 refills | Status: AC | PRN

## 2022-05-04 MED ORDER — NEOMYCIN-POLYMYXIN-HC 3.5-10000-1 OT SOLN: 4.0000 [drp] | Freq: Four times a day (QID) | OTIC | 0 refills | Status: AC

## 2022-05-04 NOTE — Addendum Note (Signed)
Addended by: Salomon Fick on: 05/04/2022 11:11 AM   Modules accepted: Orders

## 2022-05-04 NOTE — Progress Notes (Signed)
Patient ID: Kathryn Fuller, female    DOB: 09-May-1971, 51 y.o.   MRN: [id number]  PCP: Delsa Grana, PA-C  Chief Complaint  Patient presents with   decreased hearing    Right ear almost a week pt states is barely getting out of cold sx.    Subjective:   Kathryn Fuller is a 51 y.o. female, presents to clinic with CC of the following:  HPI  URI sx a week ago, some mild left eye irritation/redness that's improving and right decreased hearing   Left ear tinnitis comes and goes   At end of visit she endorses cough with wheeze at night, sinus HA, no fever, chills, sweats SOB   Patient Active Problem List   Diagnosis Date Noted   Rectal bleeding    Herniated cervical disc 10/23/2019   Routine general medical examination at a health care facility 10/13/2018   Abnormal feces    Polyp of sigmoid colon    Abdominal aortic atherosclerosis ([place]) 12/17/2016   Heme positive stool 10/17/2016   Hypertriglyceridemia 10/11/2016   Vitamin D deficiency 10/11/2016   Elevated hematocrit 10/11/2016   Right lower quadrant pain 10/05/2016   Breast cancer screening 10/05/2016   Preventative health care 10/05/2016   Osteoporosis 08/09/2015   Diverticulitis large intestine 06/09/2015   Dyslipidemia 06/05/2015   Tobacco abuse 03/27/2015      Current Outpatient Medications:    Acetaminophen (TYLENOL) 325 MG CAPS, daily as needed., Disp: , Rfl:    benzonatate (TESSALON) 100 MG capsule, Take 1-2 capsules (100-200 mg total) by mouth 3 (three) times daily as needed for cough., Disp: 30 capsule, Rfl: 1   naproxen sodium (ALEVE) 220 MG tablet, Take 220 mg by mouth., Disp: , Rfl:    neomycin-polymyxin-hydrocortisone (CORTISPORIN) OTIC solution, Place 4 drops into both ears 4 (four) times daily for 7 days., Disp: 10 mL, Rfl: 0   No Known Allergies   Social History   Tobacco Use   Smoking status: Every Day    Packs/day: 1.25    Years: 32.00    Total pack years: 40.00    Types:  Cigarettes   Smokeless tobacco: Never  Vaping Use   Vaping Use: Never used  Substance Use Topics   Alcohol use: No   Drug use: No      Chart Review Today: I personally reviewed active problem list, medication list, allergies, family history, social history, health maintenance, notes from last encounter, lab results, imaging with the patient/caregiver today.   Review of Systems  Constitutional: Negative.   HENT: Negative.    Eyes: Negative.   Respiratory: Negative.    Cardiovascular: Negative.   Gastrointestinal: Negative.   Endocrine: Negative.   Genitourinary: Negative.   Musculoskeletal: Negative.   Skin: Negative.   Allergic/Immunologic: Negative.   Neurological: Negative.   Hematological: Negative.   Psychiatric/Behavioral: Negative.    All other systems reviewed and are negative.      Objective:   Vitals:   05/04/22 0951  BP: 98/60  Pulse: 87  Resp: 16  Temp: 98 F (36.7 C)  TempSrc: Oral  SpO2: 95%  Weight: 135 lb 9.6 oz (61.5 kg)  Height: '5\' 4"'$  (1.626 m)    Body mass index is 23.28 kg/m.  Physical Exam Vitals and nursing note reviewed.  Constitutional:      General: She is not in acute distress.    Appearance: Normal appearance. She is well-developed and normal weight. She is not ill-appearing, toxic-appearing or diaphoretic.  HENT:     Head: Normocephalic and atraumatic.     Right Ear: External ear normal. Decreased hearing noted. No drainage, swelling or tenderness. There is impacted cerumen. No mastoid tenderness.     Left Ear: External ear normal. No drainage, swelling or tenderness. There is impacted cerumen. No mastoid tenderness.     Nose: Mucosal edema, congestion and rhinorrhea present. Rhinorrhea is purulent.     Right Sinus: No maxillary sinus tenderness or frontal sinus tenderness.     Left Sinus: No maxillary sinus tenderness or frontal sinus tenderness.     Mouth/Throat:     Mouth: Mucous membranes are moist. Mucous membranes are not  pale.     Pharynx: Oropharynx is clear. Uvula midline. Posterior oropharyngeal erythema present. No pharyngeal swelling, oropharyngeal exudate or uvula swelling.     Tonsils: No tonsillar exudate or tonsillar abscesses. 0 on the right. 0 on the left.  Eyes:     General: No scleral icterus.       Right eye: No discharge.        Left eye: No discharge.     Comments: Left conjunctival injection w/o discharge or lid swelling  Neck:     Trachea: No tracheal deviation.  Cardiovascular:     Rate and Rhythm: Normal rate and regular rhythm.     Pulses: Normal pulses.     Heart sounds: Normal heart sounds.  Pulmonary:     Effort: Pulmonary effort is normal. No respiratory distress.     Breath sounds: Normal breath sounds. No stridor. No wheezing, rhonchi or rales.  Abdominal:     General: Bowel sounds are normal. There is no distension.     Palpations: Abdomen is soft.  Musculoskeletal:        General: Normal range of motion.     Cervical back: Normal range of motion and neck supple.  Lymphadenopathy:     Cervical: No cervical adenopathy.  Skin:    General: Skin is warm and dry.     Coloration: Skin is not pale.     Findings: No rash.  Neurological:     Mental Status: She is alert. Mental status is at baseline.     Motor: No abnormal muscle tone.     Coordination: Coordination normal.  Psychiatric:        Mood and Affect: Mood normal.        Behavior: Behavior normal.      PROCEDURE: Indication: Cerumen impaction of the ear(s)  Medical necessity statement: On physical examination, cerumen impairs clinically significant portions of the external auditory canal, and tympanic membrane. Noted obstructive, copious cerumen that cannot be removed without magnification and instrumentations requiring MD/APP skills/procedure  Consent: Discussed benefits and risks of procedure and verbal consent obtained  Procedure:  Cerumen Disimpaction Bilaterally  Patient was prepped for the procedure.   Utilized an otoscope to assess and take note of the ear canal, the tympanic membrane, and the presence, amount, and placement of the cerumen.  Gentle ear lavage with warm water and hydrogen peroxide performed on bilateral ear.    Soft plastic curette was also utilized to remove cerumen by myself with direct visualization.   Post procedure examination: shows cerumen was completely removed. Patient tolerated procedure well.  There were no complications and following the disimpaction the tympanic membrane was visible bilaterally, TM intact.   Auditory canals are inflamed.   Pt tolerated procedure, reported some improvement of symptoms after removal of cerumen.    The patient is  made aware that they may experience temporary vertigo, temporary hearing loss, and temporary discomfort.  If these symptom last for more than 24 hours to follow up in clinic.  Ear Cerumen Removal  Date/Time: 05/04/2022 11:05 AM  Performed by: Delsa Grana, PA-C Authorized by: Delsa Grana, PA-C   Anesthesia: Local Anesthetic: none Location details: right ear and left ear Patient tolerance: patient tolerated the procedure well with no immediate complications Procedure type: curette  Sedation: Patient sedated: no         Assessment & Plan:     ICD-10-CM   1. Bilateral impacted cerumen  H61.23 Ear Cerumen Removal   successful b/l irrigation with some right ear erythema, b/l TM intact, improved sx, avoid Q-tips    2. Otitis externa of right ear, unspecified chronicity, unspecified type  H60.91 neomycin-polymyxin-hydrocortisone (CORTISPORIN) OTIC solution   inflammation, expect to improve, can use rx ear drops if bothersome sx persist after 24 hours from procedure, may also have some ETD    3. Upper respiratory tract infection, unspecified type  J06.9 benzonatate (TESSALON) 100 MG capsule   diffuse nasal and oral mucosal erythema, cough, lungs clear no fever, CP, SOB - OTC meds advised    4. Current smoker   F17.200    some wheeze at night, offered inhalers/prednisone she will continue with OTC, tessalon sent in, mucinex recommended          Delsa Grana, PA-C 05/04/22 11:06 AM

## 2022-05-04 NOTE — Patient Instructions (Addendum)
Ear Irrigation Ear irrigation is a procedure to wash dirt and wax out of your ear canal. This procedure is also called lavage. You may need ear irrigation if you are having trouble hearing because of a buildup of earwax. You may also have ear irrigation as part of the treatment for an ear infection. Getting wax and dirt out of your ear canal can help ear drops work better. Tell a health care provider about: Any allergies you have. All medicines you are taking, including vitamins, herbs, eye drops, creams, and over-the-counter medicines. Any problems you or family members have had with anesthetic medicines. Any blood disorders you have. Any surgeries you have had. This includes any ear surgeries. Any medical conditions you have. Whether you are pregnant or may be pregnant. What are the risks? Generally, this is a safe procedure. However, problems may occur, including: Infection. Pain. Hearing loss. Fluid and debris being pushed through the eardrum and into the middle ear. This can occur if there are holes in the eardrum. Ear irrigation failing to work. What happens before the procedure? You will talk with your provider about the procedure and plan. You may be given ear drops to put in your ear 15-20 minutes before irrigation. This helps loosen the wax. What happens during the procedure?  A syringe is filled with water or saline solution, which is made of salt and water. The syringe is gently inserted into the ear canal. The fluid is used to flush out wax and other debris. The procedure may vary among health care providers and hospitals. What can I expect after the procedure? After an ear irrigation, follow instructions given to you by your health care provider. Follow these instructions at home: Using ear irrigation kits Ear irrigation kits are available for use at home. Ask your health care provider if this is an option for you. In general, you should: Use a home irrigation kit only as  told by your health care provider. Read the package instructions carefully. Follow the directions for using the syringe. Use water that is room temperature. Do not do ear irrigation at home if you: Have diabetes. Diabetes increases the risk of infection. Have a hole or tear in your eardrum. Have tubes in your ears. Have had any ear surgery in the past. Have been told not to irrigate your ears. Cleaning your ears  Clean the outside of your ear with a soft washcloth daily. If told by your health care provider, use a few drops of baby oil, mineral oil, glycerin, hydrogen peroxide, or over-the-counter earwax softening drops. Do not use cotton swabs to clean your ears. These can push wax down into the ear canal. Do not put anything into your ears to try to remove wax. This includes ear candles. General instructions Take over-the-counter and prescription medicines only as told by your health care provider. If you were prescribed an antibiotic medicine, use it as told by your health care provider. Do not stop using the antibiotic even if your condition improves. Keep the ear clean and dry by following the instructions from your health care provider. Keep all follow-up visits. This is important. Visit your health care provider at least once a year to have your ears and hearing checked. Contact a health care provider if: Your hearing is not improving or is getting worse. You have pain or redness in your ear. You are dizzy. You have ringing in your ears. You have nausea or vomiting. You have fluid, blood, or pus coming out  of your ear. Summary Ear irrigation is a procedure to wash dirt and wax out of your ear canal. This procedure is also called lavage. To perform ear irrigation, ear drops may be put in your ear 15-20 minutes before irrigation. Water or saline solution will be used to flush out earwax and other debris. You may be able to irrigate your ears at home. Ask your health care provider  if this is an option for you. Follow your health care provider's instructions. Clean your ears with a soft cloth after irrigation. Do not use cotton swabs to clean your ears. These can push wax down into the ear canal. This information is not intended to replace advice given to you by your health care provider. Make sure you discuss any questions you have with your health care provider. Document Revised: 12/15/2019 Document Reviewed: 12/15/2019 Elsevier Patient Education  2023 [employer] Inc[employer]  

## 2022-05-07 ENCOUNTER — Ambulatory Visit: Payer: Self-pay

## 2022-05-07 NOTE — Telephone Encounter (Signed)
Tried to call  pt and phone # did not match pts name

## 2022-05-07 NOTE — Telephone Encounter (Signed)
Pt stated she came in office on 05/04/2022 and was told to call back if she did not improve pt stated she still cannot hear out of right ear.  Pt asked this message be sent as [employer] stated this is affecting her at work now and is been going on for a while.    Left message to call back about symptoms.

## 2022-05-07 NOTE — Telephone Encounter (Signed)
According to [facility] note, if sx continued she needed to follow up.

## 2022-05-07 NOTE — Telephone Encounter (Signed)
  Chief Complaint: Loss of hearing Symptoms: Seen 05/04/22, "Small amount of wax removed" Still with hearing loss right ear . "Feels like I'm in a tunnel." States was told all they could do for her at office and may need referral. Frequency: Not resolved Pertinent Negatives: Patient denies pain, tinnitus.  Disposition: '[]'$ ED /'[]'$ Urgent Care (no appt availability in office) / '[]'$ Appointment(In office/virtual)/ '[]'$  [facility]/ '[]'$ Home Care/ '[]'$ Refused Recommended Disposition /'[]'$ [facility]/ '[x]'$  Follow-up with PCP Additional Notes: Please advise. Reason for Disposition  [1] Decreased hearing in 1 ear AND [2] gradual onset  Answer Assessment - Initial Assessment Questions 1. DESCRIPTION: "What type of hearing problem are you having? Describe it for me." (e.g., complete hearing loss, partial loss)     Seen 05/04/22 Hearing loss right ear 2. LOCATION: "One or both ears?" If one, ask: "Which ear?"     right 3. SEVERITY: "Can you hear anything?" If Yes, ask: "What can you hear?" (e.g., ticking watch, whisper, talking)   - MILD:  Difficulty hearing soft speech, quiet library sounds, or speech from a distance or over background noise.   - MODERATE: Difficulty hearing normal speech even at closed distances.   - SEVERE: Unable to hear most normal conversation and talking; only able to hear loud sounds such as an alarm clock.     " In tunnel, can't hear anything at work" 4. ONSET: "When did this begin?" "Did it start suddenly or come on gradually?"     Seen 05/04/22, removed small ammount of wax. 5. PATTERN: "Does this come and go, or has it been constant since it started?"     Constant 6. PAIN: "Is there any pain in your ear(s)?"  (Scale 1-10; or mild, moderate, severe)   - NONE (0): no pain   - MILD (1-3): doesn't interfere with normal activities    - MODERATE (4-7): interferes with normal activities or awakens from sleep    - SEVERE (8-10): excruciating pain, unable to do any normal  activities      no  8. OTHER SYMPTOMS: "Do you have any other symptoms?" (e.g., dizziness, ringing in ears)     no  Protocols used: Hearing Loss or Change-A-AH

## 2022-05-07 NOTE — Telephone Encounter (Signed)
Called pt to inform her referral can not be placed at the moment since there is no documentation per provider. Provider is out of the office today so I can not get an answer for her if she will place it. Roselyn Reef CMA also advised patient to finish up her antibiotics and then give [place] a call if she is not doing well. Pt stated that PCP verbally told her to call within 24 hours if not feeling better to place a referral.

## 2022-05-08 ENCOUNTER — Other Ambulatory Visit: Payer: Self-pay

## 2022-05-08 ENCOUNTER — Encounter: Payer: Self-pay | Admitting: Family Medicine

## 2022-05-08 DIAGNOSIS — H9201 Otalgia, right ear: Secondary | ICD-10-CM

## 2022-05-15 NOTE — Telephone Encounter (Signed)
While you were out of the office Dr.Sowles approved for me to put in a referral for ENT.

## 2022-05-15 NOTE — Telephone Encounter (Signed)
She has signs of continued URI when I examined her - even with the wax out of the ears she could have fullness or decreased hearing cause of the viral URI or with eustachian tube dysfunction which can last a few more weeks. I recommend she come back into the office for a recheck and hearing screen prior to me sending her to a specialists.   Usually things improve with time or we should do a little more before sending to a specialists - we did not discuss referrals that I can recall

## 2022-07-09 ENCOUNTER — Ambulatory Visit (INDEPENDENT_AMBULATORY_CARE_PROVIDER_SITE_OTHER): Payer: 59 | Admitting: Family Medicine

## 2022-07-09 ENCOUNTER — Encounter: Payer: Self-pay | Admitting: Family Medicine

## 2022-07-09 VITALS — BP 98/60 | HR 82 | Temp 97.8°F | Resp 16 | Ht 64.0 in | Wt 129.5 lb

## 2022-07-09 DIAGNOSIS — A0839 Other viral enteritis: Secondary | ICD-10-CM | POA: Diagnosis not present

## 2022-07-09 DIAGNOSIS — J069 Acute upper respiratory infection, unspecified: Secondary | ICD-10-CM | POA: Diagnosis not present

## 2022-07-09 DIAGNOSIS — U071 COVID-19: Secondary | ICD-10-CM

## 2022-07-09 MED ORDER — ONDANSETRON HCL 4 MG PO TABS: 4.0000 mg | ORAL_TABLET | Freq: Three times a day (TID) | ORAL | 0 refills | Status: AC | PRN

## 2022-07-09 NOTE — Progress Notes (Signed)
Patient ID: [name], female    DOB: 1971-03-07, 51 y.o.   MRN: [id number]  PCP: Delsa Grana, PA-C  Chief Complaint  Patient presents with   Covid Positive    Home test last night   Cough   Nasal Congestion   Migraine   Emesis   Chills    Sx onset for a week    Subjective:   Kathryn Fuller is a 51 y.o. female, presents to clinic with CC of the following:  HPI  PT had onset of HA, GI upset, body aches hot flashes, N/V/D on Wed 10/25 - she had loss of smell yesterday and tested at home, + for COVID Headache has persisted , nausea current sx, diarrhea stool is green and frequent loose BM still 4-5 per day. No fever recently She also has URI sx, wet cough, congestion She has been able to eat and drink for the past several days she feels generally tired and weak She denies abdominal pain, near syncope, chest pain with breathing, sweats, wheeze, chest tightness    Patient Active Problem List   Diagnosis Date Noted   Rectal bleeding    Herniated cervical disc 10/23/2019   Routine general medical examination at a health care facility 10/13/2018   Abnormal feces    Polyp of sigmoid colon    Abdominal aortic atherosclerosis ([facility]) 12/17/2016   Heme positive stool 10/17/2016   Hypertriglyceridemia 10/11/2016   Vitamin D deficiency 10/11/2016   Elevated hematocrit 10/11/2016   Right lower quadrant pain 10/05/2016   Breast cancer screening 10/05/2016   Preventative health care 10/05/2016   Osteoporosis 08/09/2015   Diverticulitis large intestine 06/09/2015   Dyslipidemia 06/05/2015   Tobacco abuse 03/27/2015      Current Outpatient Medications:    naproxen sodium (ALEVE) 220 MG tablet, Take 220 mg by mouth., Disp: , Rfl:    Acetaminophen (TYLENOL) 325 MG CAPS, daily as needed., Disp: , Rfl:    benzonatate (TESSALON) 100 MG capsule, Take 1-2 capsules (100-200 mg total) by mouth 3 (three) times daily as needed for cough., Disp: 30 capsule, Rfl: 1   No  Known Allergies   Social History   Tobacco Use   Smoking status: Every Day    Packs/day: 1.25    Years: 32.00    Total pack years: 40.00    Types: Cigarettes   Smokeless tobacco: Never  Vaping Use   Vaping Use: Never used  Substance Use Topics   Alcohol use: No   Drug use: No      Chart Review Today: I personally reviewed active problem list, medication list, allergies, family history, social history, health maintenance, notes from last encounter, lab results, imaging with the patient/caregiver today.   Review of Systems  Constitutional: Negative.   HENT: Negative.    Eyes: Negative.   Respiratory: Negative.    Cardiovascular: Negative.   Gastrointestinal: Negative.   Endocrine: Negative.   Genitourinary: Negative.   Musculoskeletal: Negative.   Skin: Negative.   Allergic/Immunologic: Negative.   Neurological: Negative.   Hematological: Negative.   Psychiatric/Behavioral: Negative.    All other systems reviewed and are negative.      Objective:   Vitals:   07/09/22 1127  BP: 98/60  Pulse: 82  Resp: 16  Temp: 97.8 F (36.6 C)  TempSrc: Oral  SpO2: 98%  Weight: 129 lb 8 oz (58.7 kg)  Height: '5\' 4"'$  (1.626 m)    Body mass index is 22.23 kg/m.  Physical Exam  Vitals and nursing note reviewed.  Constitutional:      General: She is not in acute distress.    Appearance: Normal appearance. She is well-developed. She is not ill-appearing, toxic-appearing or diaphoretic.  HENT:     Head: Normocephalic and atraumatic.     Right Ear: Hearing, ear canal and external ear normal. There is no impacted cerumen.     Left Ear: Hearing, tympanic membrane, ear canal and external ear normal. There is no impacted cerumen.     Nose: Mucosal edema, congestion and rhinorrhea present.     Right Sinus: No maxillary sinus tenderness or frontal sinus tenderness.     Left Sinus: No maxillary sinus tenderness or frontal sinus tenderness.     Mouth/Throat:     Mouth: Mucous  membranes are not pale and dry.     Pharynx: Uvula midline. Posterior oropharyngeal erythema present. No oropharyngeal exudate or uvula swelling.     Tonsils: No tonsillar abscesses.  Eyes:     General: No scleral icterus.       Right eye: No discharge.        Left eye: No discharge.     Conjunctiva/sclera: Conjunctivae normal.  Neck:     Trachea: No tracheal deviation.  Cardiovascular:     Rate and Rhythm: Normal rate and regular rhythm.     Pulses: Normal pulses.     Heart sounds: Normal heart sounds.  Pulmonary:     Effort: Pulmonary effort is normal. No respiratory distress.     Breath sounds: No stridor. No wheezing, rhonchi or rales.  Abdominal:     General: Bowel sounds are normal. There is no distension.     Palpations: Abdomen is soft.  Musculoskeletal:     Cervical back: Normal range of motion and neck supple.  Lymphadenopathy:     Cervical: No cervical adenopathy.  Skin:    General: Skin is warm and dry.     Coloration: Skin is not jaundiced or pale.     Findings: No rash.  Neurological:     Mental Status: She is alert.     Motor: No abnormal muscle tone.     Coordination: Coordination normal.  Psychiatric:        Behavior: Behavior normal.           Assessment & Plan:    COVID positive, afebrile with continued GI and URI sx VSS, pt mildly ill appearing, requests retest from her work, done and work note given COVID sx onset 10/25, she would be day 5 of illness, encouraged her to still isolate, likely paxlovid would not be that helpful She appears mildly dehydrated but states she is pushing fluids and tolerating PO's and just resting No abd pain per pt and unremarkable on exam today Encouraged to rest, push fluids more, zofran given, she can do antihistamines, steroid nasal sprays, Mucinex and Delsym, she does have a very wet sounding cough but lungs were clear without wheeze or rales some scattered rhonchi which did clear after coughing Encouraged her to  watch for any worsening respiratory symptoms which would warrant rechecking in office or a chest x-ray A work note was given per her request to return on Friday, 07/13/2022 which she would still need to be masking for another 2 days I did encourage her to not return to work until she was having less than 3 bowel movements in 24 hours, additionally afebrile and without worsening respiratory symptoms  Encouraged her to get back to [place] on Thursday  if she has any worsening symptoms so she could be written out of work through the weekend and to return to work next week  1. Gastroenteritis due to COVID-19 virus Tolerating PO, no concerning abdominal tenderness on exam, mildly dehydrated, encouraged her to get electrolyte solutions from her pharmacy push fluids with electrolytes and sugar She will need follow-up or possibly some blood work if any worsening I did offer her blood work today but she declined  - Novel Coronavirus, NAA ([facility]) - ondansetron (ZOFRAN) 4 MG tablet; Take 1-1.5 tablets (4-6 mg total) by mouth every 8 (eight) hours as needed for nausea or vomiting.  Dispense: 20 tablet; Refill: 0  2. Upper respiratory tract infection due to COVID-19 virus Nasal mucosa, posterior oropharynx very congested and erythematous, wet sounding cough, supportive and symptomatic treatment - Novel Coronavirus, NAA ([facility])  F/up as needed     Delsa Grana, PA-C 07/09/22 11:39 AM

## 2022-07-10 ENCOUNTER — Ambulatory Visit: Payer: 59 | Admitting: Family Medicine

## 2022-07-10 LAB — NOVEL CORONAVIRUS, NAA: SARS-CoV-2, NAA: DETECTED — AB

## 2022-09-19 ENCOUNTER — Other Ambulatory Visit: Payer: Self-pay | Admitting: Family Medicine

## 2022-09-19 ENCOUNTER — Encounter: Payer: Self-pay | Admitting: Physician Assistant

## 2022-09-19 DIAGNOSIS — Z1231 Encounter for screening mammogram for malignant neoplasm of breast: Secondary | ICD-10-CM

## 2022-10-16 ENCOUNTER — Ambulatory Visit
Admission: RE | Admit: 2022-10-16 | Discharge: 2022-10-16 | Disposition: A | Discharge status: Home / Self Care | Payer: 59 | Source: Ambulatory Visit | Attending: Family Medicine | Admitting: Family Medicine

## 2022-10-16 DIAGNOSIS — Z1231 Encounter for screening mammogram for malignant neoplasm of breast: Secondary | ICD-10-CM
